# Patient Record
Sex: Male | Born: 1960
Health system: Southern US, Community
[De-identification: ages and names within clinical notes are randomized; demographics above are authoritative.]

## PROBLEM LIST (undated history)

## (undated) DIAGNOSIS — E785 Hyperlipidemia, unspecified: Secondary | ICD-10-CM

## (undated) HISTORY — DX: Hyperlipidemia, unspecified: E78.5

---
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
## 1998-04-13 HISTORY — PX: VASECTOMY: SHX75

## 1998-12-27 ENCOUNTER — Ambulatory Visit (HOSPITAL_BASED_OUTPATIENT_CLINIC_OR_DEPARTMENT_OTHER): Admission: RE | Admit: 1998-12-27 | Discharge: 1998-12-27 | Payer: Self-pay | Admitting: Urology

## 2006-03-05 ENCOUNTER — Ambulatory Visit: Payer: Self-pay | Admitting: Internal Medicine

## 2006-03-08 ENCOUNTER — Ambulatory Visit: Payer: Self-pay | Admitting: Internal Medicine

## 2006-03-08 LAB — CONVERTED CEMR LAB
ALT: 29 units/L (ref 0–40)
BUN: 10 mg/dL (ref 6–23)
Basophils Relative: 0.4 % (ref 0.0–1.0)
Calcium: 9.1 mg/dL (ref 8.4–10.5)
Chloride: 103 meq/L (ref 96–112)
Cholesterol: 190 mg/dL (ref 0–200)
Eosinophil percent: 3.8 % (ref 0.0–5.0)
Glucose, Bld: 177 mg/dL — ABNORMAL HIGH (ref 70–99)
HCT: 42.1 % (ref 39.0–52.0)
HDL: 36.3 mg/dL — ABNORMAL LOW (ref >39.0)
Hemoglobin: 14 g/dL (ref 13.0–17.0)
LDL Cholesterol: 123 mg/dL — ABNORMAL HIGH (ref 0–99)
Lymphocytes Relative: 37.2 % (ref 12.0–46.0)
MCV: 88.8 fL (ref 78.0–100.0)
Monocytes Absolute: 0.7 10*3/uL (ref 0.2–0.7)
Neutrophils Relative %: 47 % (ref 43.0–77.0)
PSA: 0.85 ng/mL (ref 0.10–4.00)
Potassium: 4 meq/L (ref 3.5–5.1)
TSH: 2.2 microintl units/mL (ref 0.35–5.50)
WBC: 5.9 10*3/uL (ref 4.5–10.5)

## 2006-03-12 ENCOUNTER — Ambulatory Visit: Payer: Self-pay | Admitting: Internal Medicine

## 2006-06-11 ENCOUNTER — Ambulatory Visit: Payer: Self-pay | Admitting: Internal Medicine

## 2006-06-11 LAB — CONVERTED CEMR LAB: Hgb A1c MFr Bld: 7.1 % — ABNORMAL HIGH (ref 4.6–6.0)

## 2006-09-13 ENCOUNTER — Encounter: Payer: Self-pay | Admitting: Internal Medicine

## 2006-09-13 DIAGNOSIS — E669 Obesity, unspecified: Secondary | ICD-10-CM | POA: Insufficient documentation

## 2006-09-13 DIAGNOSIS — E119 Type 2 diabetes mellitus without complications: Secondary | ICD-10-CM | POA: Insufficient documentation

## 2006-09-16 ENCOUNTER — Encounter: Payer: Self-pay | Admitting: Internal Medicine

## 2006-09-16 ENCOUNTER — Ambulatory Visit: Payer: Self-pay | Admitting: Internal Medicine

## 2006-11-19 ENCOUNTER — Encounter: Payer: Self-pay | Admitting: Internal Medicine

## 2007-11-10 ENCOUNTER — Ambulatory Visit: Payer: Self-pay | Admitting: Internal Medicine

## 2007-11-10 LAB — CONVERTED CEMR LAB: Blood Glucose, Fingerstick: 271

## 2007-11-28 ENCOUNTER — Ambulatory Visit: Payer: Self-pay | Admitting: Internal Medicine

## 2008-02-28 ENCOUNTER — Ambulatory Visit: Payer: Self-pay | Admitting: Internal Medicine

## 2008-02-28 LAB — CONVERTED CEMR LAB: Hgb A1c MFr Bld: 8.7 % — ABNORMAL HIGH (ref 4.6–6.0)

## 2009-12-13 ENCOUNTER — Ambulatory Visit: Payer: Self-pay | Admitting: Internal Medicine

## 2009-12-13 LAB — CONVERTED CEMR LAB: Blood Glucose, Fingerstick: 244

## 2010-03-14 ENCOUNTER — Ambulatory Visit: Payer: Self-pay | Admitting: Internal Medicine

## 2010-05-13 NOTE — Assessment & Plan Note (Signed)
Summary: 49 month rov/njr   Vital Signs:  Patient profile:   50 year old male Weight:      287 pounds Temp:     98.3 degrees F oral BP sitting:   112 / 78  (right arm) Cuff size:   large  Vitals Entered By: Duard Brady LPN (March 14, 2010 4:34 PM) CC: 3 mos rov - doing ok Is Patient Diabetic? Yes Did you bring your meter with you today? No CBG Result 158   CC:  3 mos rov - doing ok.  History of Present Illness: 50 -year-old patient who is seen today for follow-up of type 2 diabetes.  He has a history of exogenous obesity.  Diabetes is controlled with oral agents.  A postprandial blood sugar today 158.  No recent hemoglobin A1 C's.  His weight is unchanged.  Admits to dietary noncompliance.  Allergies (verified): No Known Drug Allergies  Past History:  Past Medical History: Reviewed history from 09/13/2006 and no changes required. Exogenous obesity Diabetes mellitus, type II  Review of Systems  The patient denies anorexia, fever, weight loss, weight gain, vision loss, decreased hearing, hoarseness, chest pain, syncope, dyspnea on exertion, peripheral edema, prolonged cough, headaches, hemoptysis, abdominal pain, melena, hematochezia, severe indigestion/heartburn, hematuria, incontinence, genital sores, muscle weakness, suspicious skin lesions, transient blindness, difficulty walking, depression, unusual weight change, abnormal bleeding, enlarged lymph nodes, angioedema, breast masses, and testicular masses.    Physical Exam  General:  overweight-appearing.  118/78 Eyes:  No corneal or conjunctival inflammation noted. EOMI. Perrla. Funduscopic exam benign, without hemorrhages, exudates or papilledema. Vision grossly normal. Neck:  No deformities, masses, or tenderness noted. Lungs:  Normal respiratory effort, chest expands symmetrically. Lungs are clear to auscultation, no crackles or wheezes. Heart:  Normal rate and regular rhythm. S1 and S2 normal without gallop,  murmur, click, rub or other extra sounds.   Impression & Recommendations:  Problem # 1:  DIABETES MELLITUS, TYPE II (ICD-250.00)  His updated medication list for this problem includes:    Glimepiride 4 Mg Tabs (Glimepiride) .Marland Kitchen... Take 1 tablet by mouth twice daily    Metformin Hcl 1000 Mg Tabs (Metformin hcl) ..... One twice daily  Orders: Capillary Blood Glucose/CBG (84132) Venipuncture (44010) TLB-A1C / Hgb A1C (Glycohemoglobin) (83036-A1C) Specimen Handling (27253)  Problem # 2:  EXOGENOUS OBESITY (ICD-278.00)  Complete Medication List: 1)  Glimepiride 4 Mg Tabs (Glimepiride) .... Take 1 tablet by mouth twice daily 2)  Accu-chek Aviva Kit (Blood glucose monitoring suppl) 3)  Accu-chek Aviva Strp (Glucose blood) .... Two times a day and prn 4)  Accu-chek Multiclix Lancets Misc (Lancets) .... Two times a day and prn 5)  Metformin Hcl 1000 Mg Tabs (Metformin hcl) .... One twice daily  Patient Instructions: 1)  Please schedule a follow-up appointment in 3 months. 2)  It is important that you exercise regularly at least 20 minutes 5 times a week. If you develop chest pain, have severe difficulty breathing, or feel very tired , stop exercising immediately and seek medical attention. 3)  You need to lose weight. Consider a lower calorie diet and regular exercise.  4)  Check your blood sugars regularly. If your readings are usually above : or below 70 you should contact our office. 5)  It is important that your Diabetic A1c level is checked every 3 months. 6)  See your eye doctor yearly to check for diabetic eye damage.   Orders Added: 1)  Capillary Blood Glucose/CBG [82948] 2)  Est.  Patient Level III [81191] 3)  Venipuncture [36415] 4)  TLB-A1C / Hgb A1C (Glycohemoglobin) [83036-A1C] 5)  Specimen Handling [99000]

## 2010-05-13 NOTE — Assessment & Plan Note (Signed)
Summary: med check/refill/self pay/cjr   Vital Signs:  Reeves profile:   50 year old male Height:      74 inches Weight:      288 pounds BMI:     37.11 Temp:     98.5 degrees F oral BP sitting:   150 / 90  (left arm) Cuff size:   large  Vitals Entered By: Duard Brady LPN (December 13, 2009 3:35 PM) CC: medication refills       Is Reeves Diabetic? Yes Did you bring your meter with you today? No CBG Result 244   CC:  medication refills      .  History of Present Illness: Douglas Reeves who is in today for follow-up of type 2 diabetes.  He has a history of exogenous obesity.  He is not been seen here since 2009.  He presently  has no health insurance.  He is on oral medications and has ganged a modest amount of weight since his last visit here.  No concerns or complaints.  Denies any cardiopulmonary complaints.  He is been out of his medications recently.  A random blood sugar 244  Allergies (verified): No Known Drug Allergies  Past History:  Past Medical History: Reviewed history from 09/13/2006 and no changes required. Exogenous obesity Diabetes mellitus, type II  Past Surgical History: Reviewed history from 09/13/2006 and no changes required. Appendectomy Vasectomy Back surgery, scoliosis  Review of Systems       The Reeves complains of weight gain.  The Reeves denies anorexia, fever, weight loss, vision loss, decreased hearing, hoarseness, chest pain, syncope, dyspnea on exertion, peripheral edema, prolonged cough, headaches, hemoptysis, abdominal pain, melena, hematochezia, severe indigestion/heartburn, hematuria, incontinence, genital sores, muscle weakness, suspicious skin lesions, transient blindness, difficulty walking, depression, unusual weight change, abnormal bleeding, enlarged lymph nodes, angioedema, breast masses, and testicular masses.    Physical Exam  General:  overweight-appearing.  122/82overweight-appearing.   Head:  Normocephalic  and atraumatic without obvious abnormalities. No apparent alopecia or balding. Eyes:  No corneal or conjunctival inflammation noted. EOMI. Perrla. Funduscopic exam benign, without hemorrhages, exudates or papilledema. Vision grossly normal. Mouth:  Oral mucosa and oropharynx without lesions or exudates.  Teeth in good repair. Neck:  No deformities, masses, or tenderness noted. Lungs:  Normal respiratory effort, chest expands symmetrically. Lungs are clear to auscultation, no crackles or wheezes. Heart:  Normal rate and regular rhythm. S1 and S2 normal without gallop, murmur, click, rub or other extra sounds. Abdomen:  Bowel sounds positive,abdomen soft and non-tender without masses, organomegaly or hernias noted. Msk:  No deformity or scoliosis noted of thoracic or lumbar spine.   Pulses:  R and L carotid,radial,femoral,dorsalis pedis and posterior tibial pulses are full and equal bilaterally Extremities:  No clubbing, cyanosis, edema, or deformity noted with normal full range of motion of all joints.    Diabetes Management Exam:    Eye Exam:       Eye Exam done here today          Results: normal   Impression & Recommendations:  Problem # 1:  DIABETES MELLITUS, TYPE II (ICD-250.00)  The following medications were removed from the medication list:    Metformin Hcl 500 Mg Xr24h-tab (Metformin hcl) .Marland Kitchen..Marland Kitchen Two tablets at bedtime His updated medication list for this problem includes:    Glimepiride 4 Mg Tabs (Glimepiride) .Marland Kitchen... Take 1 tablet by mouth twice daily    Metformin Hcl 1000 Mg Tabs (Metformin hcl) ..... One twice daily  Orders: Capillary Blood Glucose/CBG (16109)  a home glucometer, dispensed with samples of test strip.  Will increase his metformin to 2000 mg daily in divided dosages.  Lifestyle issues discussed at length;  will recheck in 3 months  The following medications were removed from the medication list:    Metformin Hcl 500 Mg Xr24h-tab (Metformin hcl) .Marland Kitchen..Marland Kitchen Two  tablets at bedtime His updated medication list for this problem includes:    Glimepiride 4 Mg Tabs (Glimepiride) .Marland Kitchen... Take 1 tablet by mouth twice daily    Metformin Hcl 1000 Mg Tabs (Metformin hcl) ..... One twice daily  Problem # 2:  EXOGENOUS OBESITY (ICD-278.00)  Complete Medication List: 1)  Glimepiride 4 Mg Tabs (Glimepiride) .... Take 1 tablet by mouth twice daily 2)  Accu-chek Aviva Kit (Blood glucose monitoring suppl) 3)  Accu-chek Aviva Strp (Glucose blood) .... Two times a day and prn 4)  Accu-chek Multiclix Lancets Misc (Lancets) .... Two times a day and prn 5)  Metformin Hcl 1000 Mg Tabs (Metformin hcl) .... One twice daily  Reeves Instructions: 1)  Please schedule a follow-up appointment in 3 months. 2)  Limit your Sodium (Salt). 3)  It is important that you exercise regularly at least 20 minutes 5 times a week. If you develop chest pain, have severe difficulty breathing, or feel very tired , stop exercising immediately and seek medical attention. 4)  You need to lose weight. Consider a lower calorie diet and regular exercise.  5)  Check your blood sugars regularly. If your readings are usually above : or below 70 you should contact our office. 6)  It is important that your Diabetic A1c level is checked every 3 months. 7)  See your eye doctor yearly to check for diabetic eye damage. Prescriptions: METFORMIN HCL 1000 MG TABS (METFORMIN HCL) one twice daily  #180 x 6   Entered and Authorized by:   Gordy Savers  MD   Signed by:   Gordy Savers  MD on 12/13/2009   Method used:   Print then Give to Reeves   RxID:   [id number] ACCU-CHEK MULTICLIX LANCETS  MISC (LANCETS) two times a day and prn  #90 x 6   Entered and Authorized by:   Gordy Savers  MD   Signed by:   Gordy Savers  MD on 12/13/2009   Method used:   Print then Give to Reeves   RxID:   [id number] ACCU-CHEK AVIVA  STRP (GLUCOSE BLOOD) two times a day and prn  #90 x 6    Entered and Authorized by:   Gordy Savers  MD   Signed by:   Gordy Savers  MD on 12/13/2009   Method used:   Print then Give to Reeves   RxID:   [id number] GLIMEPIRIDE 4 MG TABS (GLIMEPIRIDE) Take 1 tablet by mouth twice daily  #180 Each x 3   Entered and Authorized by:   Gordy Savers  MD   Signed by:   Gordy Savers  MD on 12/13/2009   Method used:   Print then Give to Reeves   RxID:   [id number]

## 2010-06-13 ENCOUNTER — Ambulatory Visit: Payer: Self-pay | Admitting: Internal Medicine

## 2010-06-27 ENCOUNTER — Ambulatory Visit: Payer: Self-pay | Admitting: Internal Medicine

## 2010-08-29 NOTE — Assessment & Plan Note (Signed)
[facility]                            [facility] OFFICE NOTE   Douglas Reeves, Douglas Reeves                         MRN:          [id number]  DATE:03/12/2006                            DOB:          1960-08-08    50 year old gentleman who is seen today for a wellness exam.  He has had  a remote appendectomy at age 18 and also surgery at age 73 for scoliosis.  Approximately 7-8 years ago, he had a vasectomy.   REVIEW OF SYSTEMS:  System exam is negative.  He is doing well, no  clinical concerns.   SOCIAL HISTORY:  He works as a Naval architect, fairly sedentary.  Married  with three stepchildren.  Since his initial visit here 2 1/2 years ago,  he has lost approximately 12 pounds in weight.  Non-smoker.   FAMILY HISTORY:  Father died at 27 of stomach cancer.  Mother died at 38  of dementia.  Two of five brothers have diabetes.  Also, positive for  hypertension.   PHYSICAL EXAMINATION:  GENERAL:  Exam revealed an overweight male who  appeared robust in no acute distress.  VITAL SIGNS:  Blood pressure 122/74.  SKIN:  Negative.  HEENT:  Fundi, ears, nose, and throat clear.  NECK:  No adenopathy or thyroid enlargement.  CHEST:  Clear.  CARDIOVASCULAR EXAM:  Normal heart sounds, no murmurs.  ABDOMEN:  Obese, soft and nontender.  Surgical scar in the right lower  quadrant noted.  GU:  External genitalia noted.  EXTREMITIES:  Full peripheral pulses, no edema.   Laboratory studies were reviewed and these were unremarkable except for  a fasting blood sugar of 177.  Hemoglobin A1c was 9.3.   IMPRESSION:  1. Newly diagnosed diabetes.  2. Exogenous obesity.   DISPOSITION:  Weight loss issues discussed.  A Glucometer was dispensed.  He will be placed on Metformin extended release 1000 mg at bedtime as  well as Glimepiride 10 mg every morning.  Will recheck in three months.     Gordy Savers, MD  Electronically Signed   PFK/MedQ  DD: 03/12/2006  DT:  03/13/2006  Job #: [id number]

## 2011-03-04 ENCOUNTER — Emergency Department (HOSPITAL_COMMUNITY)
Admission: EM | Admit: 2011-03-04 | Discharge: 2011-03-04 | Disposition: A | Discharge status: Home / Self Care | Payer: No Typology Code available for payment source | Attending: Emergency Medicine | Admitting: Emergency Medicine

## 2011-03-04 ENCOUNTER — Encounter: Payer: Self-pay | Admitting: *Deleted

## 2011-03-04 ENCOUNTER — Emergency Department (HOSPITAL_COMMUNITY): Payer: No Typology Code available for payment source

## 2011-03-04 DIAGNOSIS — S335XXA Sprain of ligaments of lumbar spine, initial encounter: Secondary | ICD-10-CM | POA: Insufficient documentation

## 2011-03-04 DIAGNOSIS — E119 Type 2 diabetes mellitus without complications: Secondary | ICD-10-CM | POA: Insufficient documentation

## 2011-03-04 DIAGNOSIS — Z79899 Other long term (current) drug therapy: Secondary | ICD-10-CM | POA: Insufficient documentation

## 2011-03-04 DIAGNOSIS — M545 Low back pain, unspecified: Secondary | ICD-10-CM | POA: Insufficient documentation

## 2011-03-04 DIAGNOSIS — M542 Cervicalgia: Secondary | ICD-10-CM | POA: Insufficient documentation

## 2011-03-04 DIAGNOSIS — S39012A Strain of muscle, fascia and tendon of lower back, initial encounter: Secondary | ICD-10-CM

## 2011-03-04 LAB — DIFFERENTIAL
Basophils Absolute: 0 10*3/uL (ref 0.0–0.1)
Lymphocytes Relative: 23 % (ref 12–46)
Lymphs Abs: 2.2 10*3/uL (ref 0.7–4.0)
Monocytes Absolute: 0.8 10*3/uL (ref 0.1–1.0)
Monocytes Relative: 8 % (ref 3–12)
Neutro Abs: 6.5 10*3/uL (ref 1.7–7.7)

## 2011-03-04 LAB — CBC
HCT: 42.1 % (ref 39.0–52.0)
Hemoglobin: 14.5 g/dL (ref 13.0–17.0)
RBC: 4.72 MIL/uL (ref 4.22–5.81)
RDW: 13.9 % (ref 11.5–15.5)
WBC: 9.7 10*3/uL (ref 4.0–10.5)

## 2011-03-04 LAB — BASIC METABOLIC PANEL
CO2: 28 mEq/L (ref 19–32)
Chloride: 97 mEq/L (ref 96–112)
Creatinine, Ser: 0.96 mg/dL (ref 0.50–1.35)
Glucose, Bld: 261 mg/dL — ABNORMAL HIGH (ref 70–99)

## 2011-03-04 MED ORDER — OXYCODONE-ACETAMINOPHEN 5-325 MG PO TABS: 1.0000 | ORAL_TABLET | ORAL | Status: DC | PRN

## 2011-03-04 MED ORDER — MORPHINE SULFATE 4 MG/ML IJ SOLN
4.0000 mg | Freq: Once | INTRAMUSCULAR | Status: AC
  Administered 2011-03-04: 4 mg via INTRAVENOUS

## 2011-03-04 MED ORDER — MORPHINE SULFATE 4 MG/ML IJ SOLN
4.0000 mg | Freq: Once | INTRAMUSCULAR | Status: DC
  Filled 2011-03-04: qty 1

## 2011-03-04 MED ORDER — MORPHINE SULFATE 4 MG/ML IJ SOLN
INTRAMUSCULAR | Status: AC
  Filled 2011-03-04: qty 1

## 2011-03-04 NOTE — ED Notes (Signed)
Patient transported to X-ray 

## 2011-03-04 NOTE — ED Notes (Signed)
IV access d/c'd catheter intact, no redness or swelling at the site

## 2011-03-04 NOTE — ED Notes (Signed)
Pt given rx and d/c instructions.
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
## 2011-03-04 NOTE — ED Notes (Signed)
Pt returned from radiology.
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
## 2011-03-09 ENCOUNTER — Ambulatory Visit (INDEPENDENT_AMBULATORY_CARE_PROVIDER_SITE_OTHER): Payer: No Typology Code available for payment source | Admitting: Internal Medicine

## 2011-03-09 ENCOUNTER — Encounter: Payer: Self-pay | Admitting: Internal Medicine

## 2011-03-09 DIAGNOSIS — E119 Type 2 diabetes mellitus without complications: Secondary | ICD-10-CM

## 2011-03-09 DIAGNOSIS — M549 Dorsalgia, unspecified: Secondary | ICD-10-CM

## 2011-03-09 LAB — HEMOGLOBIN A1C: Hgb A1c MFr Bld: 8.7 % — ABNORMAL HIGH (ref 4.6–6.5)

## 2011-03-09 MED ORDER — OXYCODONE-ACETAMINOPHEN 5-325 MG PO TABS: 1.0000 | ORAL_TABLET | ORAL | Status: AC | PRN

## 2011-03-09 MED ORDER — METFORMIN HCL 1000 MG PO TABS: 1000.0000 mg | ORAL_TABLET | Freq: Two times a day (BID) | ORAL | Status: DC

## 2011-03-09 MED ORDER — GLIMEPIRIDE 4 MG PO TABS: 4.0000 mg | ORAL_TABLET | Freq: Every day | ORAL | Status: DC

## 2011-03-09 NOTE — Patient Instructions (Signed)
You  may move around, but avoid painful motions and activities.  Apply ice to the sore area for 15 to 20 minutes 3 or 4 times daily for the next two to 3 days.   Please check your hemoglobin A1c every 3 months  You need to lose weight.  Consider a lower calorie diet and regular exercise.
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
## 2011-03-10 ENCOUNTER — Telehealth: Payer: Self-pay

## 2011-03-10 MED ORDER — PIOGLITAZONE HCL 15 MG PO TABS: 15.0000 mg | ORAL_TABLET | Freq: Every day | ORAL | Status: DC

## 2011-03-10 NOTE — Progress Notes (Signed)
Quick Note:  Spoke with pt- r/t new med to start. rx sent to [facility] ______

## 2011-03-10 NOTE — Telephone Encounter (Signed)
New med to start r/t A1c elevated

## 2011-03-23 ENCOUNTER — Telehealth: Payer: Self-pay | Admitting: Internal Medicine

## 2011-03-23 NOTE — Telephone Encounter (Signed)
He was seen 03/09/11 in office - do you remember seeing/filling out ins. paper work?

## 2011-03-23 NOTE — Telephone Encounter (Signed)
Pt dropped off insurance papers about a week ago and wanted to know the status. Please contact

## 2011-03-24 NOTE — Telephone Encounter (Signed)
I havenot seen these papers he is requesting - have you seen them - if not let me know so we can call him

## 2011-03-25 NOTE — Telephone Encounter (Signed)
Spoke with pt- he will bring another copy in to be filled out- asked him to stay until I look at ppwk - if I can fill out then I will - KIK

## 2011-03-25 NOTE — Telephone Encounter (Signed)
I have not seen this paperwork.  

## 2011-04-03 DIAGNOSIS — Z0279 Encounter for issue of other medical certificate: Secondary | ICD-10-CM

## 2011-05-28 ENCOUNTER — Encounter: Payer: Self-pay | Admitting: Internal Medicine

## 2011-06-05 ENCOUNTER — Encounter: Payer: Self-pay | Admitting: Internal Medicine

## 2011-06-05 ENCOUNTER — Ambulatory Visit (INDEPENDENT_AMBULATORY_CARE_PROVIDER_SITE_OTHER): Payer: 59 | Admitting: Internal Medicine

## 2011-06-05 VITALS — BP 122/80 | HR 74 | Temp 98.3°F | Resp 18 | Ht 75.0 in | Wt 282.0 lb

## 2011-06-05 DIAGNOSIS — Z23 Encounter for immunization: Secondary | ICD-10-CM

## 2011-06-05 DIAGNOSIS — Z0000 Encounter for general adult medical examination without abnormal findings: Secondary | ICD-10-CM

## 2011-06-05 LAB — POCT URINALYSIS DIPSTICK
Bilirubin, UA: NEGATIVE
Glucose, UA: NEGATIVE
Nitrite, UA: NEGATIVE
Spec Grav, UA: <=1.005
Urobilinogen, UA: 0.2

## 2011-06-05 MED ORDER — PIOGLITAZONE HCL 45 MG PO TABS: 45.0000 mg | ORAL_TABLET | Freq: Every day | ORAL | Status: DC

## 2011-06-05 NOTE — Progress Notes (Signed)
Addended by: Duard Brady I on: 06/05/2011 04:47 PM   Modules accepted: Orders

## 2011-06-05 NOTE — Progress Notes (Signed)
  Subjective:    Patient ID: Douglas Reeves, male    DOB: 07/22/1960, 51 y.o.   MRN: [id number]  HPI  51 year old patient who is seen today for a DOT physical. He has a history of type 2 diabetes as well as exogenous obesity. Actos was added to his drug regimen approximately 2 months ago due to hemoglobin A1c in excess of 8. He feels that his glycemic control has improved. There is been only modest weight loss. No hypoglycemia.  Wt Readings from Last 3 Encounters:  06/05/11 282 lb (127.914 kg)  03/09/11 283 lb (128.368 kg)  03/14/10 287 lb (130.182 kg)    Review of Systems  Constitutional: Negative for fever, chills, appetite change and fatigue.  HENT: Negative for hearing loss, ear pain, congestion, sore throat, trouble swallowing, neck stiffness, dental problem, voice change and tinnitus.   Eyes: Negative for pain, discharge and visual disturbance.  Respiratory: Negative for cough, chest tightness, wheezing and stridor.   Cardiovascular: Negative for chest pain, palpitations and leg swelling.  Gastrointestinal: Negative for nausea, vomiting, abdominal pain, diarrhea, constipation, blood in stool and abdominal distention.  Genitourinary: Negative for urgency, hematuria, flank pain, discharge, difficulty urinating and genital sores.  Musculoskeletal: Negative for myalgias, back pain, joint swelling, arthralgias and gait problem.  Skin: Negative for rash.  Neurological: Negative for dizziness, syncope, speech difficulty, weakness, numbness and headaches.  Hematological: Negative for adenopathy. Does not bruise/bleed easily.  Psychiatric/Behavioral: Negative for behavioral problems and dysphoric mood. The patient is not nervous/anxious.        Objective:   Physical Exam  Constitutional: He is oriented to person, place, and time. He appears well-developed.  HENT:  Head: Normocephalic.  Right Ear: External ear normal.  Left Ear: External ear normal.  Eyes: Conjunctivae and EOM are normal.    Neck: Normal range of motion.  Cardiovascular: Normal rate and normal heart sounds.   Pulmonary/Chest: Breath sounds normal.  Abdominal: Soft. Bowel sounds are normal.       Surgical scar right lower abdominal area  Musculoskeletal: Normal range of motion. He exhibits no edema and no tenderness.  Neurological: He is alert and oriented to person, place, and time.       Intact vibratory sensation soft touch and monofilament testing  Psychiatric: He has a normal mood and affect. His behavior is normal.          Assessment & Plan:   Preventive health care/DOT physical. Forms completed Diabetes mellitus. We'll increase Actos to 45 mg daily nonpharmacological measures discussed Exogenous obesity weight loss exercise encouraged   Recheck 3 months

## 2011-06-05 NOTE — Patient Instructions (Signed)
Limit your sodium (Salt) intake    It is important that you exercise regularly, at least 20 minutes 3 to 4 times per week.  If you develop chest pain or shortness of breath seek  medical attention.  You need to lose weight.  Consider a lower calorie diet and regular exercise.   Please check your hemoglobin A1c every 3 months   

## 2011-06-08 ENCOUNTER — Ambulatory Visit: Payer: No Typology Code available for payment source | Admitting: Internal Medicine

## 2011-09-04 ENCOUNTER — Encounter: Payer: Self-pay | Admitting: Internal Medicine

## 2011-09-04 ENCOUNTER — Ambulatory Visit (INDEPENDENT_AMBULATORY_CARE_PROVIDER_SITE_OTHER): Payer: 59 | Admitting: Internal Medicine

## 2011-09-04 DIAGNOSIS — E119 Type 2 diabetes mellitus without complications: Secondary | ICD-10-CM

## 2011-09-04 DIAGNOSIS — E669 Obesity, unspecified: Secondary | ICD-10-CM

## 2011-09-04 LAB — HEMOGLOBIN A1C: Hgb A1c MFr Bld: 7.3 % — ABNORMAL HIGH (ref 4.6–6.5)

## 2011-09-04 LAB — GLUCOSE, POCT (MANUAL RESULT ENTRY): POC Glucose: 96 mg/dl (ref 70–99)

## 2011-09-04 NOTE — Progress Notes (Signed)
  Subjective:    Patient ID: Douglas Reeves, male    DOB: 1961-03-02, 51 y.o.   MRN: [id number]  HPI  Wt Readings from Last 3 Encounters:  09/04/11 284 lb (128.822 kg)  06/05/11 282 lb (127.914 kg)  03/09/11 283 lb (128.11 kg)   51 year old patient seen today for followup of his type 2 diabetes. He is still well although there has been no weight loss. Pioglitazone has been added to his regimen with much improved glycemic control fasting blood sugar today 96. Last hemoglobin A1c over 8 no new concerns or complaints    Review of Systems  Constitutional: Negative for fever, chills, appetite change and fatigue.  HENT: Negative for hearing loss, ear pain, congestion, sore throat, trouble swallowing, neck stiffness, dental problem, voice change and tinnitus.   Eyes: Negative for pain, discharge and visual disturbance.  Respiratory: Negative for cough, chest tightness, wheezing and stridor.   Cardiovascular: Negative for chest pain, palpitations and leg swelling.  Gastrointestinal: Negative for nausea, vomiting, abdominal pain, diarrhea, constipation, blood in stool and abdominal distention.  Genitourinary: Negative for urgency, hematuria, flank pain, discharge, difficulty urinating and genital sores.  Musculoskeletal: Negative for myalgias, back pain, joint swelling, arthralgias and gait problem.  Skin: Negative for rash.  Neurological: Negative for dizziness, syncope, speech difficulty, weakness, numbness and headaches.  Hematological: Negative for adenopathy. Does not bruise/bleed easily.  Psychiatric/Behavioral: Negative for behavioral problems and dysphoric mood. The patient is not nervous/anxious.        Objective:   Physical Exam  Constitutional: He is oriented to person, place, and time. He appears well-developed.  HENT:  Head: Normocephalic.  Right Ear: External ear normal.  Left Ear: External ear normal.  Eyes: Conjunctivae and EOM are normal.  Neck: Normal range of motion.    Cardiovascular: Normal rate and normal heart sounds.   Pulmonary/Chest: Breath sounds normal.  Abdominal: Bowel sounds are normal.  Musculoskeletal: Normal range of motion. He exhibits no edema and no tenderness.  Neurological: He is alert and oriented to person, place, and time.  Psychiatric: He has a normal mood and affect. His behavior is normal.          Assessment & Plan:   Diabetes mellitus type 2 Exogenous obesity. Diet weight loss exercise all encouraged  Recheck 3 months

## 2011-09-04 NOTE — Patient Instructions (Signed)
Please check your hemoglobin A1c every 3 months    It is important that you exercise regularly, at least 20 minutes 3 to 4 times per week.  If you develop chest pain or shortness of breath seek  medical attention.  You need to lose weight.  Consider a lower calorie diet and regular exercise. 

## 2011-12-04 ENCOUNTER — Encounter: Payer: Self-pay | Admitting: Internal Medicine

## 2011-12-04 ENCOUNTER — Ambulatory Visit (INDEPENDENT_AMBULATORY_CARE_PROVIDER_SITE_OTHER): Payer: 59 | Admitting: Internal Medicine

## 2011-12-04 VITALS — BP 124/82 | HR 92 | Temp 98.1°F | Wt 290.0 lb

## 2011-12-04 DIAGNOSIS — E119 Type 2 diabetes mellitus without complications: Secondary | ICD-10-CM

## 2011-12-04 NOTE — Patient Instructions (Addendum)
Please check your hemoglobin A1c every 3 months    It is important that you exercise regularly, at least 20 minutes 3 to 4 times per week.  If you develop chest pain or shortness of breath seek  medical attention.  You need to lose weight.  Consider a lower calorie diet and regular exercise.Diabetes and Exercise Regular exercise is important and can help:    Control blood glucose (sugar).   Decrease blood pressure.     Control blood lipids (cholesterol, triglycerides).   Improve overall health.  BENEFITS FROM EXERCISE  Improved fitness.   Improved flexibility.   Improved endurance.   Increased bone density.   Weight control.   Increased muscle strength.   Decreased body fat.   Improvement of the body's use of insulin, a hormone.   Increased insulin sensitivity.   Reduction of insulin needs.   Reduced stress and tension.   Helps you feel better.  People with diabetes who add exercise to their lifestyle gain additional benefits, including:  Weight loss.   Reduced appetite.   Improvement of the body's use of blood glucose.   Decreased risk factors for heart disease:   Lowering of cholesterol and triglycerides.   Raising the level of good cholesterol (high-density lipoproteins, HDL).   Lowering blood sugar.   Decreased blood pressure.  TYPE 1 DIABETES AND EXERCISE  Exercise will usually lower your blood glucose.   If blood glucose is greater than 240 mg/dl, check urine ketones. If ketones are present, do not exercise.   Location of the insulin injection sites may need to be adjusted with exercise. Avoid injecting insulin into areas of the body that will be exercised. For example, avoid injecting insulin into:   The arms when playing tennis.   The legs when jogging. For more information, discuss this with your caregiver.   Keep a record of:   Food intake.   Type and amount of exercise.   Expected peak times of insulin action.   Blood glucose  levels.  Do this before, during, and after exercise. Review your records with your caregiver. This will help you to develop guidelines for adjusting food intake and insulin amounts.   TYPE 2 DIABETES AND EXERCISE  Regular physical activity can help control blood glucose.   Exercise is important because it may:   Increase the body's sensitivity to insulin.   Improve blood glucose control.   Exercise reduces the risk of heart disease. It decreases serum cholesterol and triglycerides. It also lowers blood pressure.   Those who take insulin or oral hypoglycemic agents should watch for signs of hypoglycemia. These signs include dizziness, shaking, sweating, chills, and confusion.   Body water is lost during exercise. It must be replaced. This will help to avoid loss of body fluids (dehydration) or heat stroke.  Be sure to talk to your caregiver before starting an exercise program to make sure it is safe for you. Remember, any activity is better than none.   Document Released: 06/20/2003 Document Revised: 03/19/2011 Document Reviewed: 10/04/2008 [facility] Patient Information 2012 [place], [place].Diabetes Meal Planning Guide The diabetes meal planning guide is a tool to help you plan your meals and snacks. It is important for people with diabetes to manage their blood glucose (sugar) levels. Choosing the right foods and the right amounts throughout your day will help control your blood glucose. Eating right can even help you improve your blood pressure and reach or maintain a healthy weight. CARBOHYDRATE COUNTING MADE EASY When you  eat carbohydrates, they turn to sugar. This raises your blood glucose level. Counting carbohydrates can help you control this level so you feel better. When you plan your meals by counting carbohydrates, you can have more flexibility in what you eat and balance your medicine with your food intake. Carbohydrate counting simply means adding up the total amount of carbohydrate  grams in your meals and snacks. Try to eat about the same amount at each meal. Foods with carbohydrates are listed below. Each portion below is 1 carbohydrate serving or 15 grams of carbohydrates. Ask your dietician how many grams of carbohydrates you should eat at each meal or snack. Grains and Starches  1 slice bread.    English muffin or hotdog/hamburger bun.    cup cold cereal (unsweetened).   ? cup cooked pasta or rice.    cup starchy vegetables (corn, potatoes, peas, beans, winter squash).   1 tortilla (6 inches).    bagel.   1 waffle or pancake (size of a CD).    cup cooked cereal.   4 to 6 small crackers.  *Whole grain is recommended. Fruit  1 cup fresh unsweetened berries, melon, papaya, pineapple.   1 small fresh fruit.    banana or mango.    cup fruit juice (4 oz unsweetened).    cup canned fruit in natural juice or water.   2 tbs dried fruit.   12 to 15 grapes or cherries.  Milk and Yogurt  1 cup fat-free or 1% milk.   1 cup soy milk.   6 oz light yogurt with sugar-free sweetener.   6 oz low-fat soy yogurt.   6 oz plain yogurt.  Vegetables  1 cup raw or  cup cooked is counted as 0 carbohydrates or a "free" food.   If you eat 3 or more servings at 1 meal, count them as 1 carbohydrate serving.  Other Carbohydrates   oz chips or pretzels.    cup ice cream or frozen yogurt.    cup sherbet or sorbet.   2 inch square cake, no frosting.   1 tbs honey, sugar, jam, jelly, or syrup.   2 small cookies.   3 squares of graham crackers.   3 cups popcorn.   6 crackers.   1 cup broth-based soup.   Count 1 cup casserole or other mixed foods as 2 carbohydrate servings.   Foods with less than 20 calories in a serving may be counted as 0 carbohydrates or a "free" food.  You may want to purchase a book or computer software that lists the carbohydrate gram counts of different foods. In addition, the nutrition facts panel on the labels  of the foods you eat are a good source of this information. The label will tell you how big the serving size is and the total number of carbohydrate grams you will be eating per serving. Divide this number by 15 to obtain the number of carbohydrate servings in a portion. Remember, 1 carbohydrate serving equals 15 grams of carbohydrate. SERVING SIZES Measuring foods and serving sizes helps you make sure you are getting the right amount of food. The list below tells how big or small some common serving sizes are.  1 oz.........4 stacked dice.   3 oz........Mar[place] KitchenDeck of cards.   1 tsp.......Mar[place] KitchenTip of little finger.   1 tbs......Marland KitchenMarland KitchenThumb.   2 tbs.......Marland KitchenGolf ball.    cup......Marland KitchenHalf of a fist.   1 cup.......Marland KitchenA fist.  SAMPLE DIABETES MEAL PLAN Below is a sample meal plan  that includes foods from the grain and starches, dairy, vegetable, fruit, and meat groups. A dietician can individualize a meal plan to fit your calorie needs and tell you the number of servings needed from each food group. However, controlling the total amount of carbohydrates in your meal or snack is more important than making sure you include all of the food groups at every meal. You may interchange carbohydrate containing foods (dairy, starches, and fruits). The meal plan below is an example of a 2000 calorie diet using carbohydrate counting. This meal plan has 17 carbohydrate servings. Breakfast  1 cup oatmeal (2 carb servings).    cup light yogurt (1 carb serving).   1 cup blueberries (1 carb serving).    cup almonds.  Snack  1 large apple (2 carb servings).   1 low-fat string cheese stick.  Lunch  Chicken breast salad.   1 cup spinach.    cup chopped tomatoes.   2 oz chicken breast, sliced.   2 tbs low-fat [place] dressing.   12 whole-wheat crackers (2 carb servings).   12 to 15 grapes (1 carb serving).   1 cup low-fat milk (1 carb serving).  Snack  1 cup carrots.    cup hummus (1 carb serving).    Dinner  3 oz broiled salmon.   1 cup brown rice (3 carb servings).  Snack  1  cups steamed broccoli (1 carb serving) drizzled with 1 tsp olive oil and lemon juice.   1 cup light pudding (2 carb servings).  DIABETES MEAL PLANNING WORKSHEET Your dietician can use this worksheet to help you decide how many servings of foods and what types of foods are right for you.   BREAKFAST Food Group and Servings / Carb Servings Grain/Starches __________________________________ Dairy __________________________________________ Vegetable ______________________________________ Fruit ___________________________________________ Meat __________________________________________ Fat ____________________________________________ LUNCH Food Group and Servings / Carb Servings Grain/Starches ___________________________________ Dairy ___________________________________________ Fruit ____________________________________________ Meat ___________________________________________ Fat _____________________________________________ Laural Golden Food Group and Servings / Carb Servings Grain/Starches ___________________________________ Dairy ___________________________________________ Fruit ____________________________________________ Meat ___________________________________________ Fat _____________________________________________ SNACKS Food Group and Servings / Carb Servings Grain/Starches ___________________________________ Dairy ___________________________________________ Vegetable _______________________________________ Fruit ____________________________________________ Meat ___________________________________________ Fat _____________________________________________ DAILY TOTALS Starches _________________________ Vegetable ________________________ Fruit ____________________________ Dairy ____________________________ Meat ____________________________ Fat ______________________________ Document Released:  12/25/2004 Document Revised: 03/19/2011 Document Reviewed: 11/05/2008 [employer] Patient Information 2012 [place], [place].Diabetes, Type 2 Diabetes is a long-lasting (chronic) disease. In type 2 diabetes, the pancreas does not make enough insulin (a hormone), and the body does not respond normally to the insulin that is made. This type of diabetes was also previously called adult-onset diabetes. It usually occurs after the age of 30, but it can occur at any age.   CAUSES   Type 2 diabetes happens because the pancreas is not making enough insulin or your body has trouble using the insulin that your pancreas does make properly. SYMPTOMS    Drinking more than usual.   Urinating more than usual.   Blurred vision.   Dry, itchy skin.   Frequent infections.   Feeling more tired than usual (fatigue).  DIAGNOSIS The diagnosis of type 2 diabetes is usually made by one of the following tests:  Fasting blood glucose test. You will not eat for at least 8 hours and then take a blood test.   Random blood glucose test. Your blood glucose (sugar) is checked at any time of the day regardless of when you ate.   Oral glucose tolerance test (OGTT). Your blood glucose is measured after you have not  eaten (fasted) and then after you drink a glucose containing beverage.  TREATMENT    Healthy eating.   Exercise.   Medicine, if needed.   Monitoring blood glucose.   Seeing your caregiver regularly.  HOME CARE INSTRUCTIONS    Check your blood glucose at least once a day. More frequent monitoring may be necessary, depending on your medicines and on how well your diabetes is controlled. Your caregiver will advise you.   Take your medicine as directed by your caregiver.   Do not smoke.   Make wise food choices. Ask your caregiver for information. Weight loss can improve your diabetes.   Learn about low blood glucose (hypoglycemia) and how to treat it.   Get your eyes checked regularly.   Have a  yearly physical exam. Have your blood pressure checked and your blood and urine tested.   Wear a pendant or bracelet saying that you have diabetes.   Check your feet every night for cuts, sores, blisters, and redness. Let your caregiver know if you have any problems.  SEEK MEDICAL CARE IF:    You have problems keeping your blood glucose in target range.   You have problems with your medicines.   You have symptoms of an illness that do not improve after 24 hours.   You have a sore or wound that is not healing.   You notice a change in vision or a new problem with your vision.   You have a fever.  MAKE SURE YOU:  Understand these instructions.   Will watch your condition.   Will get help right away if you are not doing well or get worse.  Document Released: 03/30/2005 Document Revised: 03/19/2011 Document Reviewed: 09/15/2010 [facility] Patient Information 2012 [place], [place].Diets for Diabetes, Food Labeling Look at food labels to help you decide how much of a product you can eat. You will want to check the amount of total carbohydrate in a serving to see how the food fits into your meal plan. In the list of ingredients, the ingredient present in the largest amount by weight must be listed first, followed by the other ingredients in descending order. STANDARD OF IDENTITY Most products have a list of ingredients. However, foods that the Food and Drug Administration (FDA) has given a standard of identity do not need a list of ingredients. A standard of identity means that a food must contain certain ingredients if it is called a particular name. Examples are mayonnaise, peanut butter, ketchup, jelly, and cheese. LABELING TERMS There are many terms found on food labels. Some of these terms have specific definitions. Some terms are regulated by the FDA, and the FDA has clearly specified how they can be used. Others are not regulated or well-defined and can be misleading and  confusing. SPECIFICALLY DEFINED TERMS Nutritive Sweetener.  A sweetener that contains calories,such as table sugar or honey.  Nonnutritive Sweetener.  A sweetener with few or no calories,such as saccharin, aspartame, sucralose, and cyclamate.  LABELING TERMS REGULATED BY THE FDA Free.  The product contains only a tiny or small amount of fat, cholesterol, sodium, sugar, or calories. For example, a "fat-free" product will contain less than 0.5 g of fat per serving.  Low.  A food described as "low" in fat, saturated fat, cholesterol, sodium, or calories could be eaten fairly often without exceeding dietary guidelines. For example, "low in fat" means no more than 3 g of fat per serving.  Lean.  "Lean" and "extra lean" are U.S.  Department of Agriculture Architect) terms for use on meat and poultry products. "Lean" means the product contains less than 10 g of fat, 4 g of saturated fat, and 95 mg of cholesterol per serving. "Lean" is not as low in fat as a product labeled "low."  Extra Lean.  "Extra lean" means the product contains less than 5 g of fat, 2 g of saturated fat, and 95 mg of cholesterol per serving. While "extra lean" has less fat than "lean," it is still higher in fat than a product labeled "low."  Reduced, Less, Fewer.  A diet product that contains 25% less of a nutrient or calories than the regular version. For example, hot dogs might be labeled "25% less fat than our regular hot dogs."  Light/Lite.  A diet product that contains ? fewer calories or  the fat of the original. For example, "light in sodium" means a product with  the usual sodium.  More.  One serving contains at least 10% more of the daily value of a vitamin, mineral, or fiber than usual.  Good Source Of.  One serving contains 10% to 19% of the daily value for a particular vitamin, mineral, or fiber.  Excellent Source Of.  One serving contains 20% or more of the daily value for a particular nutrient. Other terms  used might be "high in" or "rich in."  Enriched or Fortified.  The product contains added vitamins, minerals, or protein. Nutrition labeling must be used on enriched or fortified foods.  Imitation.  The product has been altered so that it is lower in protein, vitamins, or minerals than the usual food,such as imitation peanut butter.  Total Fat.  The number listed is the total of all fat found in a serving of the product. Under total fat, food labels must list saturated fat and trans fat, which are associated with raising bad cholesterol and an increased risk of heart blood vessel disease.  Saturated Fat.  Mainly fats from animal-based sources. Some examples are red meat, cheese, cream, whole milk, and coconut oil.  Trans Fat.  Found in some fried snack foods, packaged foods, and fried restaurant foods. It is recommended you eat as close to 0 g of trans fat as possible, since it raises bad cholesterol and lowers good cholesterol.  Polyunsaturated and Monounsaturated Fats.  More healthful fats. These fats are from plant sources.  Total Carbohydrate.  The number of carbohydrate grams in a serving of the product. Under total carbohydrate are listed the other carbohydrate sources, such as dietary fiber and sugars.  Dietary Fiber.  A carbohydrate from plant sources.  Sugars.  Sugars listed on the label contain all naturally occurring sugars as well as added sugars.  LABELING TERMS NOT REGULATED BY THE FDA Sugarless.  Table sugar (sucrose) has not been added. However, the manufacturer may use another form of sugar in place of sucrose to sweeten the product. For example, sugar alcohols are used to sweeten foods. Sugar alcohols are a form of sugar but are not table sugar. If a product contains sugar alcohols in place of sucrose, it can still be labeled "sugarless."  Low Salt, Salt-Free, Unsalted, No Salt, No Salt Added, Without Added Salt.  Food that is usually processed with salt has been  made without salt. However, the food may contain sodium-containing additives, such as preservatives, leavening agents, or flavorings.  Natural.  This term has no legal meaning.  Organic.  Foods that are certified as organic have been inspected and approved  by the USDA to ensure they are produced without pesticides, fertilizers containing synthetic ingredients, bioengineering, or ionizing radiation.  Document Released: 04/02/2003 Document Revised: 03/19/2011 Document Reviewed: 10/18/2008 [facility] Patient Information 2012 [place], [place].

## 2011-12-04 NOTE — Progress Notes (Signed)
  Subjective:    Patient ID: Douglas Reeves, male    DOB: 10/24/1960, 51 y.o.   MRN: [id number]  HPI   51 year old patient who is seen today for followup. He has type 2 diabetes. 3 months ago hemoglobin A1c 7.3. He is on triple oral therapy. There's been some modest weight gain over the past 3 months. He feels blood sugars have trended up slightly.  Wt Readings from Last 3 Encounters:  12/04/11 290 lb (131.543 kg)  09/04/11 284 lb (128.822 kg)  06/05/11 282 lb (127.914 kg)    Review of Systems  Constitutional: Negative for fever, chills, appetite change and fatigue.  HENT: Negative for hearing loss, ear pain, congestion, sore throat, trouble swallowing, neck stiffness, dental problem, voice change and tinnitus.   Eyes: Negative for pain, discharge and visual disturbance.  Respiratory: Negative for cough, chest tightness, wheezing and stridor.   Cardiovascular: Negative for chest pain, palpitations and leg swelling.  Gastrointestinal: Negative for nausea, vomiting, abdominal pain, diarrhea, constipation, blood in stool and abdominal distention.  Genitourinary: Negative for urgency, hematuria, flank pain, discharge, difficulty urinating and genital sores.  Musculoskeletal: Negative for myalgias, back pain, joint swelling, arthralgias and gait problem.  Skin: Negative for rash.  Neurological: Negative for dizziness, syncope, speech difficulty, weakness, numbness and headaches.  Hematological: Negative for adenopathy. Does not bruise/bleed easily.  Psychiatric/Behavioral: Negative for behavioral problems and dysphoric mood. The patient is not nervous/anxious.        Objective:   Physical Exam  Constitutional: He is oriented to person, place, and time. He appears well-developed.       Obese weight 290  HENT:  Head: Normocephalic.  Right Ear: External ear normal.  Left Ear: External ear normal.  Eyes: Conjunctivae and EOM are normal.  Neck: Normal range of motion.  Cardiovascular: Normal  rate and normal heart sounds.   Pulmonary/Chest: Breath sounds normal.  Abdominal: Bowel sounds are normal.  Musculoskeletal: Normal range of motion. He exhibits no edema and no tenderness.  Neurological: He is alert and oriented to person, place, and time.  Psychiatric: He has a normal mood and affect. His behavior is normal.          Assessment & Plan:   Diabetes mellitus. Suboptimal control. Options discussed including addition of Victoza The patient will consider dietary referral Lifestyle issues addressed. Information dispensed We'll recheck in 3 months if unimproved will intensify therapy

## 2011-12-07 NOTE — Progress Notes (Signed)
Quick Note:  spoke with pt- informed of results and dr. Vernon Prey instructions ______

## 2012-03-04 ENCOUNTER — Ambulatory Visit: Payer: 59 | Admitting: Internal Medicine

## 2012-03-11 ENCOUNTER — Ambulatory Visit: Payer: 59 | Admitting: Internal Medicine

## 2012-03-24 ENCOUNTER — Ambulatory Visit (INDEPENDENT_AMBULATORY_CARE_PROVIDER_SITE_OTHER): Payer: 59 | Admitting: Internal Medicine

## 2012-03-24 ENCOUNTER — Encounter: Payer: Self-pay | Admitting: Internal Medicine

## 2012-03-24 VITALS — BP 150/86 | HR 80 | Temp 97.9°F | Resp 18 | Wt 265.0 lb

## 2012-03-24 DIAGNOSIS — E119 Type 2 diabetes mellitus without complications: Secondary | ICD-10-CM

## 2012-03-24 DIAGNOSIS — E669 Obesity, unspecified: Secondary | ICD-10-CM

## 2012-03-24 DIAGNOSIS — Z23 Encounter for immunization: Secondary | ICD-10-CM

## 2012-03-24 MED ORDER — METFORMIN HCL 1000 MG PO TABS: 1000.0000 mg | ORAL_TABLET | Freq: Two times a day (BID) | ORAL | Status: DC

## 2012-03-24 MED ORDER — PIOGLITAZONE HCL 45 MG PO TABS: 45.0000 mg | ORAL_TABLET | Freq: Every day | ORAL | Status: DC

## 2012-03-24 MED ORDER — GLIMEPIRIDE 4 MG PO TABS: 4.0000 mg | ORAL_TABLET | Freq: Every day | ORAL | Status: DC

## 2012-03-24 NOTE — Patient Instructions (Signed)
Please check your hemoglobin A1c every 3 months  You need to lose weight.  Consider a lower calorie diet and regular exercise.    It is important that you exercise regularly, at least 20 minutes 3 to 4 times per week.  If you develop chest pain or shortness of breath seek  medical attention. 

## 2012-03-24 NOTE — Progress Notes (Signed)
Subjective:    Patient ID: Douglas Reeves, male    DOB: February 01, 1961, 51 y.o.   MRN: [id number]  HPI  51 year old patient who has type 2 diabetes and exogenous obesity. He is been on triple therapy in 3 months ago his hemoglobin A1c is 9.1. Since this time he has been dieting and has lost 25 pounds in weight. Blood sugars have genetically improved and often are in the 80s. He's had no hypoglycemia. He generally feels quite well.  Past Medical History  Diagnosis Date  . Diabetes mellitus     History   Social History  . Marital Status: Married    Spouse Name: N/A    Number of Children: N/A  . Years of Education: N/A   Occupational History  . Not on file.   Social History Main Topics  . Smoking status: Never Smoker   . Smokeless tobacco: Never Used  . Alcohol Use: No  . Drug Use: Not on file  . Sexually Active: Not on file   Other Topics Concern  . Not on file   Social History Narrative  . No narrative on file    Past Surgical History  Procedure Date  . Vasectomy   . Appendectomy   . Back surgery     No family history on file.  No Known Allergies  Current Outpatient Prescriptions on File Prior to Visit  Medication Sig Dispense Refill  . glimepiride (AMARYL) 4 MG tablet Take 1 tablet (4 mg total) by mouth daily before breakfast.  90 tablet  6  . metFORMIN (GLUCOPHAGE) 1000 MG tablet Take 1 tablet (1,000 mg total) by mouth 2 (two) times daily with a meal.  180 tablet  6  . pioglitazone (ACTOS) 45 MG tablet Take 1 tablet (45 mg total) by mouth daily.  90 tablet  6    BP 150/86  Pulse 80  Temp 97.9 F (36.6 C) (Oral)  Resp 18  Wt 265 lb (120.203 kg)  SpO2 97%     Review of Systems  Constitutional: Negative for fever, chills, appetite change and fatigue.  HENT: Negative for hearing loss, ear pain, congestion, sore throat, trouble swallowing, neck stiffness, dental problem, voice change and tinnitus.   Eyes: Negative for pain, discharge and visual disturbance.   Respiratory: Negative for cough, chest tightness, wheezing and stridor.   Cardiovascular: Negative for chest pain, palpitations and leg swelling.  Gastrointestinal: Negative for nausea, vomiting, abdominal pain, diarrhea, constipation, blood in stool and abdominal distention.  Genitourinary: Negative for urgency, hematuria, flank pain, discharge, difficulty urinating and genital sores.  Musculoskeletal: Negative for myalgias, back pain, joint swelling, arthralgias and gait problem.  Skin: Negative for rash.  Neurological: Negative for dizziness, syncope, speech difficulty, weakness, numbness and headaches.  Hematological: Negative for adenopathy. Does not bruise/bleed easily.  Psychiatric/Behavioral: Negative for behavioral problems and dysphoric mood. The patient is not nervous/anxious.        Objective:   Physical Exam  Constitutional: He is oriented to person, place, and time. He appears well-developed.       Repeat blood pressure 120/78  HENT:  Head: Normocephalic.  Right Ear: External ear normal.  Left Ear: External ear normal.  Eyes: Conjunctivae normal and EOM are normal.  Neck: Normal range of motion.  Cardiovascular: Normal rate and normal heart sounds.   Pulmonary/Chest: Breath sounds normal.  Abdominal: Bowel sounds are normal.  Musculoskeletal: Normal range of motion. He exhibits no edema and no tenderness.  Neurological: He is alert and oriented to  person, place, and time.  Psychiatric: He has a normal mood and affect. His behavior is normal.          Assessment & Plan:   Diabetes mellitus type 2 Exogenous obesity  Both much improved. We'll check a hemoglobin A1c should be much improved. Continue efforts at weight loss recheck 3 months

## 2012-03-25 LAB — HEMOGLOBIN A1C: Hgb A1c MFr Bld: 6.7 % — ABNORMAL HIGH (ref 4.6–6.5)

## 2012-06-24 ENCOUNTER — Ambulatory Visit: Payer: 59 | Admitting: Internal Medicine

## 2012-07-27 ENCOUNTER — Ambulatory Visit (INDEPENDENT_AMBULATORY_CARE_PROVIDER_SITE_OTHER): Payer: 59 | Admitting: Internal Medicine

## 2012-07-27 ENCOUNTER — Encounter: Payer: Self-pay | Admitting: Internal Medicine

## 2012-07-27 VITALS — BP 128/80 | HR 101 | Temp 98.2°F | Resp 20 | Wt 279.0 lb

## 2012-07-27 DIAGNOSIS — E669 Obesity, unspecified: Secondary | ICD-10-CM

## 2012-07-27 DIAGNOSIS — E119 Type 2 diabetes mellitus without complications: Secondary | ICD-10-CM

## 2012-07-27 NOTE — Progress Notes (Signed)
  Subjective:    Patient ID: Douglas Reeves, male    DOB: 05/15/1960, 52 y.o.   MRN: [id number]  HPI  Wt Readings from Last 3 Encounters:  07/27/12 279 lb (126.554 kg)  03/24/12 265 lb (120.203 kg)  12/04/11 290 lb (131.543 kg)     Review of Systems     Objective:   Physical Exam        Assessment & Plan:

## 2012-07-27 NOTE — Patient Instructions (Signed)
Limit your sodium (Salt) intake    It is important that you exercise regularly, at least 20 minutes 3 to 4 times per week.  If you develop chest pain or shortness of breath seek  medical attention.  You need to lose weight.  Consider a lower calorie diet and regular exercise. 

## 2012-07-27 NOTE — Progress Notes (Signed)
Subjective:    Patient ID: Douglas Reeves, male    DOB: [birth year], 52 y.o.   MRN: [id number]  HPI   52 year old patient who is seen today for followup of type 2 diabetes. After considerable weight loss his hemoglobin A1c was at goal 4 months ago. Unfortunately there has been some significant weight gain over the past 3-4 months. He generally feels well. Random blood sugar this morning 160 at home.  Past Medical History  Diagnosis Date  . Diabetes mellitus     History   Social History  . Marital Status: Married    Spouse Name: N/A    Number of Children: N/A  . Years of Education: N/A   Occupational History  . Not on file.   Social History Main Topics  . Smoking status: Never Smoker   . Smokeless tobacco: Never Used  . Alcohol Use: No  . Drug Use: Not on file  . Sexually Active: Not on file   Other Topics Concern  . Not on file   Social History Narrative  . No narrative on file    Past Surgical History  Procedure Laterality Date  . Vasectomy    . Appendectomy    . Back surgery      No family history on file.  No Known Allergies  Current Outpatient Prescriptions on File Prior to Visit  Medication Sig Dispense Refill  . glimepiride (AMARYL) 4 MG tablet Take 1 tablet (4 mg total) by mouth daily before breakfast.  90 tablet  6  . metFORMIN (GLUCOPHAGE) 1000 MG tablet Take 1 tablet (1,000 mg total) by mouth 2 (two) times daily with a meal.  180 tablet  6  . pioglitazone (ACTOS) 45 MG tablet Take 1 tablet (45 mg total) by mouth daily.  90 tablet  6   No current facility-administered medications on file prior to visit.    BP 128/80  Pulse 101  Temp(Src) 98.2 F (36.8 C) (Oral)  Resp 20  Wt 279 lb (126.554 kg)  BMI 34.87 kg/m2  SpO2 97%       Review of Systems  Constitutional: Positive for unexpected weight change. Negative for fever, chills, appetite change and fatigue.  HENT: Negative for hearing loss, ear pain, congestion, sore throat, trouble  swallowing, neck stiffness, dental problem, voice change and tinnitus.   Eyes: Negative for pain, discharge and visual disturbance.  Respiratory: Negative for cough, chest tightness, wheezing and stridor.   Cardiovascular: Negative for chest pain, palpitations and leg swelling.  Gastrointestinal: Negative for nausea, vomiting, abdominal pain, diarrhea, constipation, blood in stool and abdominal distention.  Genitourinary: Negative for urgency, hematuria, flank pain, discharge, difficulty urinating and genital sores.  Musculoskeletal: Negative for myalgias, back pain, joint swelling, arthralgias and gait problem.  Skin: Negative for rash.  Neurological: Negative for dizziness, syncope, speech difficulty, weakness, numbness and headaches.  Hematological: Negative for adenopathy. Does not bruise/bleed easily.  Psychiatric/Behavioral: Negative for behavioral problems and dysphoric mood. The patient is not nervous/anxious.        Objective:   Physical Exam  Constitutional: He is oriented to person, place, and time. He appears well-developed.  Blood pressure well controlled less than 120/80  HENT:  Head: Normocephalic.  Right Ear: External ear normal.  Left Ear: External ear normal.  Eyes: Conjunctivae and EOM are normal.  Neck: Normal range of motion.  Cardiovascular: Normal rate and normal heart sounds.   Pulmonary/Chest: Breath sounds normal.  Abdominal: Bowel sounds are normal.  Musculoskeletal: Normal range of motion.  He exhibits no edema and no tenderness.  Neurological: He is alert and oriented to person, place, and time.  Psychiatric: He has a normal mood and affect. His behavior is normal.          Assessment & Plan:   Diabetes mellitus. Weight loss encouraged. We'll check a hemoglobin A1c. Reassess in 4 months Exogenous obesity

## 2012-12-02 ENCOUNTER — Ambulatory Visit: Payer: 59 | Admitting: Internal Medicine

## 2013-02-16 ENCOUNTER — Other Ambulatory Visit: Payer: Self-pay

## 2013-04-01 ENCOUNTER — Other Ambulatory Visit: Payer: Self-pay | Admitting: Internal Medicine

## 2013-07-07 ENCOUNTER — Other Ambulatory Visit: Payer: Self-pay | Admitting: Internal Medicine

## 2013-07-10 ENCOUNTER — Other Ambulatory Visit: Payer: Self-pay | Admitting: Internal Medicine

## 2014-03-17 ENCOUNTER — Other Ambulatory Visit: Payer: Self-pay | Admitting: Internal Medicine

## 2014-03-22 ENCOUNTER — Other Ambulatory Visit: Payer: Self-pay | Admitting: Internal Medicine

## 2014-05-11 ENCOUNTER — Other Ambulatory Visit: Payer: Self-pay | Admitting: Internal Medicine

## 2014-06-08 ENCOUNTER — Other Ambulatory Visit (INDEPENDENT_AMBULATORY_CARE_PROVIDER_SITE_OTHER): Payer: 59

## 2014-06-08 DIAGNOSIS — Z0000 Encounter for general adult medical examination without abnormal findings: Secondary | ICD-10-CM

## 2014-06-08 LAB — BASIC METABOLIC PANEL
BUN: 11 mg/dL (ref 6–23)
CALCIUM: 9.4 mg/dL (ref 8.4–10.5)
CO2: 32 mEq/L (ref 19–32)
Chloride: 102 mEq/L (ref 96–112)
Creatinine, Ser: 0.96 mg/dL (ref 0.40–1.50)
GFR: 105.34 mL/min (ref >60.00)
GLUCOSE: 167 mg/dL — AB (ref 70–99)
Potassium: 5.7 mEq/L — ABNORMAL HIGH (ref 3.5–5.1)
SODIUM: 138 meq/L (ref 135–145)

## 2014-06-08 LAB — CBC WITH DIFFERENTIAL/PLATELET
BASOS PCT: 0.6 % (ref 0.0–3.0)
Basophils Absolute: 0 10*3/uL (ref 0.0–0.1)
EOS PCT: 3.3 % (ref 0.0–5.0)
Eosinophils Absolute: 0.2 10*3/uL (ref 0.0–0.7)
HCT: 41.3 % (ref 39.0–52.0)
Hemoglobin: 13.8 g/dL (ref 13.0–17.0)
LYMPHS PCT: 38.7 % (ref 12.0–46.0)
Lymphs Abs: 1.9 10*3/uL (ref 0.7–4.0)
MCHC: 33.5 g/dL (ref 30.0–36.0)
MCV: 88.1 fl (ref 78.0–100.0)
MONO ABS: 0.5 10*3/uL (ref 0.1–1.0)
MONOS PCT: 9.8 % (ref 3.0–12.0)
NEUTROS PCT: 47.6 % (ref 43.0–77.0)
Neutro Abs: 2.4 10*3/uL (ref 1.4–7.7)
PLATELETS: 283 10*3/uL (ref 150.0–400.0)
RBC: 4.68 Mil/uL (ref 4.22–5.81)
RDW: 14 % (ref 11.5–15.5)
WBC: 4.9 10*3/uL (ref 4.0–10.5)

## 2014-06-08 LAB — HEPATIC FUNCTION PANEL
ALBUMIN: 4 g/dL (ref 3.5–5.2)
ALK PHOS: 73 U/L (ref 39–117)
ALT: 30 U/L (ref 0–53)
AST: 22 U/L (ref 0–37)
Bilirubin, Direct: 0.1 mg/dL (ref 0.0–0.3)
TOTAL PROTEIN: 6.7 g/dL (ref 6.0–8.3)
Total Bilirubin: 0.8 mg/dL (ref 0.2–1.2)

## 2014-06-08 LAB — LIPID PANEL
CHOL/HDL RATIO: 5
Cholesterol: 176 mg/dL (ref 0–200)
HDL: 38.5 mg/dL — AB (ref >39.00)
LDL Cholesterol: 102 mg/dL — ABNORMAL HIGH (ref 0–99)
NONHDL: 137.5
TRIGLYCERIDES: 176 mg/dL — AB (ref 0.0–149.0)
VLDL: 35.2 mg/dL (ref 0.0–40.0)

## 2014-06-08 LAB — HEMOGLOBIN A1C: HEMOGLOBIN A1C: 9.4 % — AB (ref 4.6–6.5)

## 2014-06-08 LAB — PSA: PSA: 1.01 ng/mL (ref 0.10–4.00)

## 2014-06-08 LAB — POCT URINALYSIS DIPSTICK
BILIRUBIN UA: NEGATIVE
Blood, UA: NEGATIVE
Glucose, UA: NEGATIVE
KETONES UA: NEGATIVE
Leukocytes, UA: NEGATIVE
NITRITE UA: NEGATIVE
PROTEIN UA: NEGATIVE
Spec Grav, UA: 1.015
Urobilinogen, UA: 0.2
pH, UA: 7

## 2014-06-08 LAB — MICROALBUMIN / CREATININE URINE RATIO
Creatinine,U: 135.6 mg/dL
Microalb Creat Ratio: 0.5 mg/g (ref 0.0–30.0)
Microalb, Ur: <0.7 mg/dL (ref 0.0–1.9)

## 2014-06-08 LAB — TSH: TSH: 1.91 u[IU]/mL (ref 0.35–4.50)

## 2014-06-15 ENCOUNTER — Encounter: Payer: Self-pay | Admitting: Internal Medicine

## 2014-06-15 ENCOUNTER — Ambulatory Visit (INDEPENDENT_AMBULATORY_CARE_PROVIDER_SITE_OTHER): Payer: 59 | Admitting: Internal Medicine

## 2014-06-15 VITALS — BP 146/80 | HR 92 | Temp 98.2°F | Resp 20 | Ht 74.5 in | Wt 276.0 lb

## 2014-06-15 DIAGNOSIS — E119 Type 2 diabetes mellitus without complications: Secondary | ICD-10-CM

## 2014-06-15 DIAGNOSIS — Z0000 Encounter for general adult medical examination without abnormal findings: Secondary | ICD-10-CM

## 2014-06-15 MED ORDER — DULAGLUTIDE 1.5 MG/0.5ML ~~LOC~~ SOAJ: 1.5000 mg | SUBCUTANEOUS | Status: DC

## 2014-06-15 NOTE — Progress Notes (Signed)
Subjective:    Patient ID: Douglas Reeves, male    DOB: [birth year], 54 y.o.   MRN: [id number]  HPI    54 year old patient who is seen today for a wellness exam.  He is seen infrequently but does have a history of type 2 diabetes.  He has exogenous obesity  No prior colonoscopy  Lab Results  Component Value Date   HGBA1C 9.4* 06/08/2014    Wt Readings from Last 3 Encounters:  06/15/14 276 lb (125.193 kg)  07/27/12 279 lb (126.554 kg)  03/24/12 265 lb (120.203 kg)    Allergies (verified): No Known Drug Allergies  Past History:  Past Medical History:  Exogenous obesity Diabetes mellitus, type II  Past Surgical History:  Appendectomy Vasectomy Back surgery, scoliosis  Review of Systems  Constitutional: Negative for fever, chills, activity change, appetite change and fatigue.  HENT: Negative for congestion, dental problem, ear pain, hearing loss, mouth sores, rhinorrhea, sinus pressure, sneezing, tinnitus, trouble swallowing and voice change.   Eyes: Negative for photophobia, pain, redness and visual disturbance.  Respiratory: Negative for apnea, cough, choking, chest tightness, shortness of breath and wheezing.   Cardiovascular: Negative for chest pain, palpitations and leg swelling.  Gastrointestinal: Negative for nausea, vomiting, abdominal pain, diarrhea, constipation, blood in stool, abdominal distention, anal bleeding and rectal pain.  Genitourinary: Negative for dysuria, urgency, frequency, hematuria, flank pain, decreased urine volume, discharge, penile swelling, scrotal swelling, difficulty urinating, genital sores and testicular pain.  Musculoskeletal: Negative for myalgias, back pain, joint swelling, arthralgias, gait problem, neck pain and neck stiffness.  Skin: Negative for color change, rash and wound.  Neurological: Positive for numbness (Occasional mild numbness of the feet). Negative for dizziness, tremors, seizures, syncope, facial asymmetry, speech  difficulty, weakness, light-headedness and headaches.  Hematological: Negative for adenopathy. Does not bruise/bleed easily.  Psychiatric/Behavioral: Negative for suicidal ideas, hallucinations, behavioral problems, confusion, sleep disturbance, self-injury, dysphoric mood, decreased concentration and agitation. The patient is not nervous/anxious.        Objective:   Physical Exam  Constitutional: He appears well-developed and well-nourished.  Repeat blood pressure 120/78 Weight 276  HENT:  Head: Normocephalic and atraumatic.  Right Ear: External ear normal.  Left Ear: External ear normal.  Nose: Nose normal.  Mouth/Throat: Oropharynx is clear and moist.  Eyes: Conjunctivae and EOM are normal. Pupils are equal, round, and reactive to light. No scleral icterus.  Neck: Normal range of motion. Neck supple. No JVD present. No thyromegaly present.  Cardiovascular: Regular rhythm, normal heart sounds and intact distal pulses.  Exam reveals no gallop and no friction rub.   No murmur heard. Pulmonary/Chest: Effort normal and breath sounds normal. He exhibits no tenderness.  Abdominal: Soft. Bowel sounds are normal. He exhibits no distension and no mass. There is no tenderness.  Genitourinary: Prostate normal and penis normal. Guaiac negative stool.  Musculoskeletal: Normal range of motion. He exhibits no edema or tenderness.  Lymphadenopathy:    He has no cervical adenopathy.  Neurological: He is alert. He has normal reflexes. No cranial nerve deficit. Coordination normal.  Skin: Skin is warm and dry. No rash noted.  Circular scar just to the right of the umbilicus and surgical scar involving the mid back area  Psychiatric: He has a normal mood and affect. His behavior is normal.          Assessment & Plan:  Preventive health examination Diabetes mellitus.  Poor control.  Will add GLP-1 agonist.  Nonpharmacologic measures discussed Exogenous obesity.  Dietary  information discussed.   Recheck 3 months if not improved, will set up for dietary counseling  Screening colonoscopy.  Encouraged Annual eye examination recommended

## 2014-06-15 NOTE — Patient Instructions (Signed)
It is important that you exercise regularly, at least 20 minutes 3 to 4 times per week.  If you develop chest pain or shortness of breath seek  medical attention.  You need to lose weight.  Consider a lower calorie diet and regular exercise.   Please check your hemoglobin A1c every 3 months  Please see your eye doctor yearly to check for diabetic eye damage  Schedule your colonoscopy to help detect colon cancer.  Diabetes Mellitus and Food It is important for you to manage your blood sugar (glucose) level. Your blood glucose level can be greatly affected by what you eat. Eating healthier foods in the appropriate amounts throughout the day at about the same time each day will help you control your blood glucose level. It can also help slow or prevent worsening of your diabetes mellitus. Healthy eating may even help you improve the level of your blood pressure and reach or maintain a healthy weight.  HOW CAN FOOD AFFECT ME? Carbohydrates Carbohydrates affect your blood glucose level more than any other type of food. Your dietitian will help you determine how many carbohydrates to eat at each meal and teach you how to count carbohydrates. Counting carbohydrates is important to keep your blood glucose at a healthy level, especially if you are using insulin or taking certain medicines for diabetes mellitus. Alcohol Alcohol can cause sudden decreases in blood glucose (hypoglycemia), especially if you use insulin or take certain medicines for diabetes mellitus. Hypoglycemia can be a life-threatening condition. Symptoms of hypoglycemia (sleepiness, dizziness, and disorientation) are similar to symptoms of having too much alcohol.  If your health care provider has given you approval to drink alcohol, do so in moderation and use the following guidelines:  Women should not have more than one drink per day, and men should not have more than two drinks per day. One drink is equal to:  12 oz of beer.  5  oz of wine.  1 oz of hard liquor.  Do not drink on an empty stomach.  Keep yourself hydrated. Have water, diet soda, or unsweetened iced tea.  Regular soda, juice, and other mixers might contain a lot of carbohydrates and should be counted. WHAT FOODS ARE NOT RECOMMENDED? As you make food choices, it is important to remember that all foods are not the same. Some foods have fewer nutrients per serving than other foods, even though they might have the same number of calories or carbohydrates. It is difficult to get your body what it needs when you eat foods with fewer nutrients. Examples of foods that you should avoid that are high in calories and carbohydrates but low in nutrients include:  Trans fats (most processed foods list trans fats on the Nutrition Facts label).  Regular soda.  Juice.  Candy.  Sweets, such as cake, pie, doughnuts, and cookies.  Fried foods. WHAT FOODS CAN I EAT? Have nutrient-rich foods, which will nourish your body and keep you healthy. The food you should eat also will depend on several factors, including:  The calories you need.  The medicines you take.  Your weight.  Your blood glucose level.  Your blood pressure level.  Your cholesterol level. You also should eat a variety of foods, including:  Protein, such as meat, poultry, fish, tofu, nuts, and seeds (lean animal proteins are best).  Fruits.  Vegetables.  Dairy products, such as milk, cheese, and yogurt (low fat is best).  Breads, grains, pasta, cereal, rice, and beans.  Fats such  as olive oil, trans fat-free margarine, canola oil, avocado, and olives. DOES EVERYONE WITH DIABETES MELLITUS HAVE THE SAME MEAL PLAN? Because every person with diabetes mellitus is different, there is not one meal plan that works for everyone. It is very important that you meet with a dietitian who will help you create a meal plan that is just right for you. Document Released: 12/25/2004 Document Revised:  04/04/2013 Document Reviewed: 02/24/2013 [facility] Patient Information 2015 [place], [place]. This information is not intended to replace advice given to you by your health care provider. Make sure you discuss any questions you have with your health care provider. Diabetes, Eating Away From Home Sometimes, you might eat in a restaurant or have meals that are prepared by someone else. You can enjoy eating out. However, the portions in restaurants may be much larger than needed. Listed below are some ideas to help you choose foods that will keep your blood glucose (sugar) in better control.  TIPS FOR EATING OUT  Know your meal plan and how many carbohydrate servings you should have at each meal. You may wish to carry a copy of your meal plan in your purse or wallet. Learn the foods included in each food group.  Make a list of restaurants near you that offer healthy choices. Take a copy of the carry-out menus to see what they offer. Then, you can plan what you will order ahead of time.  Become familiar with serving sizes by practicing them at home using measuring cups and spoons. Once you learn to recognize portion sizes, you will be able to correctly estimate the amount of total carbohydrate you are allowed to eat at the restaurant. Ask for a takeout box if the portion is more than you should have. When your food comes, leave the amount you should have on the plate, and put the rest in the takeout box before you start eating.  Plan ahead if your mealtime will be different from usual. Check with your caregiver to find out how to time meals and medicine if you are taking insulin.  Avoid high-fat foods, such as fried foods, cream sauces, high-fat salad dressings, or any added butter or margarine.  Do not be afraid to ask questions. Ask your server about the portion size, cooking methods, ingredients and if items can be substituted. Restaurants do not list all available items on the menu. You can ask for  your main entree to be prepared using skim milk, oil instead of butter or margarine, and without gravy or sauces. Ask your waiter or waitress to serve salad dressings, gravy, sauces, margarine, and sour cream on the side. You can then add the amount your meal plan suggests.  Add more vegetables whenever possible.  Avoid items that are labeled "jumbo," "giant," "deluxe," or "supersized."  You may want to split an entre with someone and order an extra side salad.  Watch for hidden calories in foods like croutons, bacon, or cheese.  Ask your server to take away the bread basket or chips from your table.  Order a dinner salad as an appetizer. You can eat most foods served in a restaurant. Some foods are better choices than others. Breads and Starches  Recommended: All kinds of bread (wheat, rye, white, oatmeal, [place], [place], raisin), hard or soft dinner rolls, frankfurter or hamburger buns, small bagels, small corn or whole-wheat flour tortillas.  Avoid: Frosted or glazed breads, butter rolls, egg or cheese breads, croissants, sweet rolls, pastries, coffee cake, glazed or frosted  doughnuts, muffins. Crackers  Recommended: Animal crackers, graham, rye, saltine, oyster, and matzoth crackers. Bread sticks, melba toast, rusks, pretzels, popcorn (without fat), zwieback toast.  Avoid: High-fat snack crackers or chips. Buttered popcorn. Cereals  Recommended: Hot and cold cereals. Whole grains such as oatmeal or shredded wheat are good choices.  Avoid: Sugar-coated or granola type cereals. Potatoes/Pasta/Rice/Beans  Recommended: Order baked, boiled, or mashed potatoes, rice or noodles without added fat, whole beans. Order gravies, butter, margarine, or sauces on the side so you can control the amount you add.  Avoid: Hash browns or fried potatoes. Potatoes, pasta, or rice prepared with cream or cheese sauce. Potato or pasta salads prepared with large amounts of dressing. Fried beans or fried  rice. Vegetables  Recommended: Order steamed, baked, boiled, or stewed vegetables without sauces or extra fat. Ask that sauce be served on the side. If vegetables are not listed on the menu, ask what is available.  Avoid: Vegetables prepared with cream, butter, or cheese sauce. Fried vegetables. Salad Bars  Recommended: Many of the vegetables at a salad bar are considered "free." Use lemon juice, vinegar, or low-calorie salad dressing (fewer than 20 calories per serving) as "free" dressings for your salad. Look for salad bar ingredients that have no added fat or sugar such as tomatoes, lettuce, cucumbers, broccoli, carrots, onions, and mushrooms.  Avoid: Prepared salads with large amounts of dressing, such as coleslaw, caesar salad, macaroni salad, bean salad, or carrot salad. Fruit  Recommended: Eat fresh fruit or fresh fruit salad without added dressing. A salad bar often offers fresh fruit choices, but canned fruit at a restaurant is usually packed in sugar or syrup.  Avoid: Sweetened canned or frozen fruits, plain or sweetened fruit juice. Fruit salads with dressing, sour cream, or sugar added to them. Meat and Meat Substitutes  Recommended: Order broiled, baked, roasted, or grilled meat, poultry, or fish. Trim off all visible fat. Do not eat the skin of poultry. The size stated on the menu is the raw weight. Meat shrinks by  in cooking (for example, 4 oz raw equals 3 oz cooked meat).  Avoid: Deep-fat fried meat, poultry, or fish. Breaded meats. Eggs  Recommended: Order soft, hard-cooked, poached, or scrambled eggs. Omelets may be okay, depending on what ingredients are added. Egg substitutes are also a good choice.  Avoid: Fried eggs, eggs prepared with cream or cheese sauce. Milk  Recommended: Order low-fat or fat-free milk according to your meal plan. Plain, nonfat yogurt or flavored yogurt with no sugar added may be used as a substitute for milk. Soy milk may also be  used.  Avoid: Milk shakes or sweetened milk beverages. Soups and Combination Foods  Recommended: Clear broth or consomm are "free" foods and may be used as an appetizer. Broth-based soups with fat removed count as a starch serving and are preferred over cream soups. Soups made with beans or split peas may be eaten but count as a starch.  Avoid: Fatty soups, soup made with cream, cheese soup. Combination foods prepared with excessive amounts of fat or with cream or cheese sauces. Desserts and Sweets  Recommended: Ask for fresh fruit. Sponge or angel food cake without icing, ice milk, no sugar added ice cream, sherbet, or frozen yogurt may fit into your meal plan occasionally.  Avoid: Pastries, puddings, pies, cakes with icing, custard, gelatin desserts. Fats and Oils  Recommended: Choose healthy fats such as olive oil, canola oil, or tub margarine, reduced fat or fat-free sour cream, cream  cheese, avocado, or nuts.  Avoid: Any fats in excess of your allowed portion. Deep-fried foods or any food with a large amount of fat. Note: Ask for all fats to be served on the side, and limit your portion sizes according to your meal plan. Document Released: 03/30/2005 Document Revised: 06/22/2011 Document Reviewed: 06/27/2013 [facility] Patient Information 2015 [place], [place]. This information is not intended to replace advice given to you by your health care provider. Make sure you discuss any questions you have with your health care provider. Health Maintenance A healthy lifestyle and preventative care can promote health and wellness.  Maintain regular health, dental, and eye exams.  Eat a healthy diet. Foods like vegetables, fruits, whole grains, low-fat dairy products, and lean protein foods contain the nutrients you need and are low in calories. Decrease your intake of foods high in solid fats, added sugars, and salt. Get information about a proper diet from your health care provider, if  necessary.  Regular physical exercise is one of the most important things you can do for your health. Most adults should get at least 150 minutes of moderate-intensity exercise (any activity that increases your heart rate and causes you to sweat) each week. In addition, most adults need muscle-strengthening exercises on 2 or more days a week.   Maintain a healthy weight. The body mass index (BMI) is a screening tool to identify possible weight problems. It provides an estimate of body fat based on height and weight. Your health care provider can find your BMI and can help you achieve or maintain a healthy weight. For males 20 years and older:  A BMI below 18.5 is considered underweight.  A BMI of 18.5 to 24.9 is normal.  A BMI of 25 to 29.9 is considered overweight.  A BMI of 30 and above is considered obese.  Maintain normal blood lipids and cholesterol by exercising and minimizing your intake of saturated fat. Eat a balanced diet with plenty of fruits and vegetables. Blood tests for lipids and cholesterol should begin at age 65 and be repeated every 5 years. If your lipid or cholesterol levels are high, you are over age 91, or you are at high risk for heart disease, you may need your cholesterol levels checked more frequently.Ongoing high lipid and cholesterol levels should be treated with medicines if diet and exercise are not working.  If you smoke, find out from your health care provider how to quit. If you do not use tobacco, do not start.  Lung cancer screening is recommended for adults aged 13-80 years who are at high risk for developing lung cancer because of a history of smoking. A yearly low-dose CT scan of the lungs is recommended for people who have at least a 30-pack-year history of smoking and are current smokers or have quit within the past 15 years. A pack year of smoking is smoking an average of 1 pack of cigarettes a day for 1 year (for example, a 30-pack-year history of  smoking could mean smoking 1 pack a day for 30 years or 2 packs a day for 15 years). Yearly screening should continue until the smoker has stopped smoking for at least 15 years. Yearly screening should be stopped for people who develop a health problem that would prevent them from having lung cancer treatment.  If you choose to drink alcohol, do not have more than 2 drinks per day. One drink is considered to be 12 oz (360 mL) of beer, 5 oz (150  mL) of wine, or 1.5 oz (45 mL) of liquor.  Avoid the use of street drugs. Do not share needles with anyone. Ask for help if you need support or instructions about stopping the use of drugs.  High blood pressure causes heart disease and increases the risk of stroke. Blood pressure should be checked at least every 1-2 years. Ongoing high blood pressure should be treated with medicines if weight loss and exercise are not effective.  If you are 30-59 years old, ask your health care provider if you should take aspirin to prevent heart disease.  Diabetes screening involves taking a blood sample to check your fasting blood sugar level. This should be done once every 3 years after age 19 if you are at a normal weight and without risk factors for diabetes. Testing should be considered at a younger age or be carried out more frequently if you are overweight and have at least 1 risk factor for diabetes.  Colorectal cancer can be detected and often prevented. Most routine colorectal cancer screening begins at the age of 95 and continues through age 52. However, your health care provider may recommend screening at an earlier age if you have risk factors for colon cancer. On a yearly basis, your health care provider may provide home test kits to check for hidden blood in the stool. A small camera at the end of a tube may be used to directly examine the colon (sigmoidoscopy or colonoscopy) to detect the earliest forms of colorectal cancer. Talk to your health care provider about  this at age 46 when routine screening begins. A direct exam of the colon should be repeated every 5-10 years through age 30, unless early forms of precancerous polyps or small growths are found.  People who are at an increased risk for hepatitis B should be screened for this virus. You are considered at high risk for hepatitis B if:  You were born in a country where hepatitis B occurs often. Talk with your health care provider about which countries are considered high risk.  Your parents were born in a high-risk country and you have not received a shot to protect against hepatitis B (hepatitis B vaccine).  You have HIV or AIDS.  You use needles to inject street drugs.  You live with, or have sex with, someone who has hepatitis B.  You are a man who has sex with other men (MSM).  You get hemodialysis treatment.  You take certain medicines for conditions like cancer, organ transplantation, and autoimmune conditions.  Hepatitis C blood testing is recommended for all people born from [birth year] through 1964 and any individual with known risk factors for hepatitis C.  Healthy men should no longer receive prostate-specific antigen (PSA) blood tests as part of routine cancer screening. Talk to your health care provider about prostate cancer screening.  Testicular cancer screening is not recommended for adolescents or adult males who have no symptoms. Screening includes self-exam, a health care provider exam, and other screening tests. Consult with your health care provider about any symptoms you have or any concerns you have about testicular cancer.  Practice safe sex. Use condoms and avoid high-risk sexual practices to reduce the spread of sexually transmitted infections (STIs).  You should be screened for STIs, including gonorrhea and chlamydia if:  You are sexually active and are younger than 24 years.  You are older than 24 years, and your health care provider tells you that you are at risk  for this  type of infection.  Your sexual activity has changed since you were last screened, and you are at an increased risk for chlamydia or gonorrhea. Ask your health care provider if you are at risk.  If you are at risk of being infected with HIV, it is recommended that you take a prescription medicine daily to prevent HIV infection. This is called pre-exposure prophylaxis (PrEP). You are considered at risk if:  You are a man who has sex with other men (MSM).  You are a heterosexual man who is sexually active with multiple partners.  You take drugs by injection.  You are sexually active with a partner who has HIV.  Talk with your health care provider about whether you are at high risk of being infected with HIV. If you choose to begin PrEP, you should first be tested for HIV. You should then be tested every 3 months for as long as you are taking PrEP.  Use sunscreen. Apply sunscreen liberally and repeatedly throughout the day. You should seek shade when your shadow is shorter than you. Protect yourself by wearing long sleeves, pants, a wide-brimmed hat, and sunglasses year round whenever you are outdoors.  Tell your health care provider of new moles or changes in moles, especially if there is a change in shape or color. Also, tell your health care provider if a mole is larger than the size of a pencil eraser.  A one-time screening for abdominal aortic aneurysm (AAA) and surgical repair of large AAAs by ultrasound is recommended for men aged 34-75 years who are current or former smokers.  Stay current with your vaccines (immunizations). Document Released: 09/26/2007 Document Revised: 04/04/2013 Document Reviewed: 08/25/2010 [facility] Patient Information 2015 [place], [place]. This information is not intended to replace advice given to you by your health care provider. Make sure you discuss any questions you have with your health care provider.

## 2014-06-15 NOTE — Progress Notes (Signed)
Pre visit review using our clinic review tool, if applicable. No additional management support is needed unless otherwise documented below in the visit note. 

## 2014-06-18 ENCOUNTER — Encounter: Payer: Self-pay | Admitting: Internal Medicine

## 2014-07-14 ENCOUNTER — Other Ambulatory Visit: Payer: Self-pay | Admitting: Internal Medicine

## 2014-08-06 ENCOUNTER — Ambulatory Visit (AMBULATORY_SURGERY_CENTER): Payer: Self-pay | Admitting: *Deleted

## 2014-08-06 VITALS — Ht 74.0 in | Wt 283.6 lb

## 2014-08-06 DIAGNOSIS — Z1211 Encounter for screening for malignant neoplasm of colon: Secondary | ICD-10-CM

## 2014-08-06 MED ORDER — NA SULFATE-K SULFATE-MG SULF 17.5-3.13-1.6 GM/177ML PO SOLN: ORAL | Status: DC

## 2014-08-06 NOTE — Progress Notes (Signed)
No allergies to eggs or soy. No problems with anesthesia.  Pt given Emmi instructions for colonoscopy  No oxygen use  No diet drug use  

## 2014-08-20 ENCOUNTER — Ambulatory Visit (AMBULATORY_SURGERY_CENTER): Payer: 59 | Admitting: Internal Medicine

## 2014-08-20 ENCOUNTER — Other Ambulatory Visit: Payer: Self-pay | Admitting: Internal Medicine

## 2014-08-20 ENCOUNTER — Encounter: Payer: Self-pay | Admitting: Internal Medicine

## 2014-08-20 VITALS — BP 144/84 | HR 78 | Temp 96.1°F | Resp 22 | Ht 74.0 in | Wt 283.0 lb

## 2014-08-20 DIAGNOSIS — Z1211 Encounter for screening for malignant neoplasm of colon: Secondary | ICD-10-CM | POA: Diagnosis present

## 2014-08-20 DIAGNOSIS — D122 Benign neoplasm of ascending colon: Secondary | ICD-10-CM

## 2014-08-20 DIAGNOSIS — D121 Benign neoplasm of appendix: Secondary | ICD-10-CM

## 2014-08-20 DIAGNOSIS — K635 Polyp of colon: Secondary | ICD-10-CM | POA: Diagnosis not present

## 2014-08-20 DIAGNOSIS — K529 Noninfective gastroenteritis and colitis, unspecified: Secondary | ICD-10-CM | POA: Diagnosis not present

## 2014-08-20 LAB — GLUCOSE, CAPILLARY
Glucose-Capillary: 88 mg/dL (ref 70–99)
Glucose-Capillary: 88 mg/dL (ref 70–99)

## 2014-08-20 MED ORDER — SODIUM CHLORIDE 0.9 % IV SOLN
500.0000 mL | INTRAVENOUS | Status: DC
Start: 2014-08-20 — End: 2014-08-20

## 2014-08-20 NOTE — Op Note (Signed)
[place]  [address]. [place], [address]   COLONOSCOPY PROCEDURE REPORT  PATIENT: Douglas, Reeves  MR#: [id number] BIRTHDATE: 07/26/1960 , 6  yrs. old GENDER: male ENDOSCOPIST: Jerene Bears, MD REFERRED HG:[id number] Douglas Reeves, M.D. PROCEDURE DATE:  08/20/2014 PROCEDURE:   Colonoscopy with biopsy, Colonoscopy with cold biopsy polypectomy, and Colonoscopy, screening First Screening Colonoscopy - Avg.  risk and is 50 yrs.  old or older Yes.  Prior Negative Screening - Now for repeat screening. N/A  History of Adenoma - Now for follow-up colonoscopy & has been > or = to 3 yrs.  N/A  Polyps Removed Today ASA CLASS:   Class II INDICATIONS:Screening for colonic neoplasia and Colorectal Neoplasm Risk Assessment for this procedure is average risk. MEDICATIONS: Monitored anesthesia care and Propofol 200 mg IV  DESCRIPTION OF PROCEDURE:   After the risks benefits and alternatives of the procedure were thoroughly explained, informed consent was obtained.  The digital rectal exam revealed no rectal mass.   The LB [id number]  endoscope was introduced through the anus and advanced to the cecum, which was identified by both the appendix and ileocecal valve. No adverse events experienced. The quality of the prep was good.  (MoviPrep was used)  The instrument was then slowly withdrawn as the colon was fully examined.  COLON FINDINGS: A possible, approximately, 1cm subepithelial lesion was located at the cecum at appendical orifice.  Multiple biopsies of the lesion were performed using cold forceps.   A sessile polyp measuring 3 mm in size was found in the ascending colon.  A polypectomy was performed with cold forceps.  The resection was complete, the polyp tissue was completely retrieved and sent to histology.   There was mild diverticulosis noted in the descending colon and sigmoid colon.  Retroflexed views revealed internal hemorrhoids. The time to cecum = 3.3  Withdrawal time = 15.1   The scope was withdrawn and the procedure completed. COMPLICATIONS: There were no immediate complications.  ENDOSCOPIC IMPRESSION: 1.   Possible 1cm subepithelial lesion was located at appendical orifice; multiple biopsies of the lesion were performed 2.   Sessile polyp was found in the ascending colon; polypectomy was performed with cold forceps 3.   Mild diverticulosis was noted in the descending colon and sigmoid colon  RECOMMENDATIONS: 1.  Await pathology results 2.  High fiber diet 3.  Timing of repeat colonoscopy will be determined by pathology findings. 4.  You will receive a letter within 1-2 weeks with the results of your biopsy as well as final recommendations.  Please call my office if you have not received a letter after 3 weeks.  eSigned:  Jerene Bears, MD 08/20/2014 8:37 AM   cc: Marletta Lor, MD and The Patient

## 2014-08-20 NOTE — Patient Instructions (Signed)
Multiple biopsies taken in colon and colon polyp x 1 removed today. Diverticulosis seen. Try to follow high fiber diet. Handouts given on polyps,diverticulosis and high fiber diet. Result letter will come in your mail in about 1-2 weeks. Call us with any questions or concerns. Thank you.  YOU HAD AN ENDOSCOPIC PROCEDURE TODAY AT [facility]:   Refer to the procedure report that was given to you for any specific questions about what was found during the examination.  If the procedure report does not answer your questions, please call your gastroenterologist to clarify.  If you requested that your care partner not be given the details of your procedure findings, then the procedure report has been included in a sealed envelope for you to review at your convenience later.  YOU SHOULD EXPECT: Some feelings of bloating in the abdomen. Passage of more gas than usual.  Walking can help get rid of the air that was put into your GI tract during the procedure and reduce the bloating. If you had a lower endoscopy (such as a colonoscopy or flexible sigmoidoscopy) you may notice spotting of blood in your stool or on the toilet paper. If you underwent a bowel prep for your procedure, you may not have a normal bowel movement for a few days.  Please Note:  You might notice some irritation and congestion in your nose or some drainage.  This is from the oxygen used during your procedure.  There is no need for concern and it should clear up in a day or so.  SYMPTOMS TO REPORT IMMEDIATELY:   Following lower endoscopy (colonoscopy or flexible sigmoidoscopy):  Excessive amounts of blood in the stool  Significant tenderness or worsening of abdominal pains  Swelling of the abdomen that is new, acute  Fever of 100F or higher   For urgent or emergent issues, a gastroenterologist can be reached at any hour by calling [phone].   DIET: Your first meal following the procedure should be a small meal  and then it is ok to progress to your normal diet. Heavy or fried foods are harder to digest and may make you feel nauseous or bloated.  Likewise, meals heavy in dairy and vegetables can increase bloating.  Drink plenty of fluids but you should avoid alcoholic beverages for 24 hours.  ACTIVITY:  You should plan to take it easy for the rest of today and you should NOT DRIVE or use heavy machinery until tomorrow (because of the sedation medicines used during the test).    FOLLOW UP: Our staff will call the number listed on your records the next business day following your procedure to check on you and address any questions or concerns that you may have regarding the information given to you following your procedure. If we do not reach you, we will leave a message.  However, if you are feeling well and you are not experiencing any problems, there is no need to return our call.  We will assume that you have returned to your regular daily activities without incident.  If any biopsies were taken you will be contacted by phone or by letter within the next 1-3 weeks.  Please call us at [phone] if you have not heard about the biopsies in 3 weeks.    SIGNATURES/CONFIDENTIALITY: You and/or your care partner have signed paperwork which will be entered into your electronic medical record.  These signatures attest to the fact that that the information above on your After  Visit Summary has been reviewed and is understood.  Full responsibility of the confidentiality of this discharge information lies with you and/or your care-partner. 

## 2014-08-20 NOTE — Progress Notes (Signed)
A/ox3 pleased with MAC, report to [place]

## 2014-08-20 NOTE — Progress Notes (Signed)
Called to room to assist during endoscopic procedure.  Patient ID and intended procedure confirmed with present staff. Received instructions for my participation in the procedure from the performing physician.  

## 2014-08-21 ENCOUNTER — Telehealth: Payer: Self-pay

## 2014-08-21 NOTE — Telephone Encounter (Signed)
  Follow up Call-  Call back number 08/20/2014  Post procedure Call Back phone  # [phone]  Permission to leave phone message Yes     Patient questions:  Do you have a fever, pain , or abdominal swelling? No. Pain Score  0 *  Have you tolerated food without any problems? Yes.    Have you been able to return to your normal activities? Yes.    Do you have any questions about your discharge instructions: Diet   No. Medications  No. Follow up visit  No.  Do you have questions or concerns about your Care? No.  Actions: * If pain score is 4 or above: No action needed, pain <4.  No problems per the pt. maw

## 2014-08-29 ENCOUNTER — Encounter: Payer: Self-pay | Admitting: Internal Medicine

## 2014-08-30 ENCOUNTER — Other Ambulatory Visit: Payer: Self-pay

## 2014-08-30 DIAGNOSIS — K388 Other specified diseases of appendix: Secondary | ICD-10-CM

## 2014-09-05 ENCOUNTER — Ambulatory Visit: Payer: 59 | Admitting: Physician Assistant

## 2014-09-05 ENCOUNTER — Other Ambulatory Visit (INDEPENDENT_AMBULATORY_CARE_PROVIDER_SITE_OTHER): Payer: 59

## 2014-09-05 DIAGNOSIS — K388 Other specified diseases of appendix: Secondary | ICD-10-CM

## 2014-09-05 DIAGNOSIS — K389 Disease of appendix, unspecified: Secondary | ICD-10-CM | POA: Diagnosis not present

## 2014-09-05 LAB — BASIC METABOLIC PANEL
BUN: 15 mg/dL (ref 6–23)
CHLORIDE: 101 meq/L (ref 96–112)
CO2: 29 mEq/L (ref 19–32)
Calcium: 9.3 mg/dL (ref 8.4–10.5)
Creatinine, Ser: 1.07 mg/dL (ref 0.40–1.50)
GFR: 92.86 mL/min (ref >60.00)
Glucose, Bld: 226 mg/dL — ABNORMAL HIGH (ref 70–99)
POTASSIUM: 4.2 meq/L (ref 3.5–5.1)
Sodium: 136 mEq/L (ref 135–145)

## 2014-09-06 ENCOUNTER — Ambulatory Visit (INDEPENDENT_AMBULATORY_CARE_PROVIDER_SITE_OTHER)
Admission: RE | Admit: 2014-09-06 | Discharge: 2014-09-06 | Disposition: A | Discharge status: Home / Self Care | Payer: 59 | Source: Ambulatory Visit | Attending: Internal Medicine | Admitting: Internal Medicine

## 2014-09-06 DIAGNOSIS — K389 Disease of appendix, unspecified: Secondary | ICD-10-CM

## 2014-09-06 DIAGNOSIS — K388 Other specified diseases of appendix: Secondary | ICD-10-CM

## 2014-09-06 MED ORDER — IOHEXOL 300 MG/ML  SOLN
100.0000 mL | Freq: Once | INTRAMUSCULAR | Status: AC | PRN
  Administered 2014-09-06: 100 mL via INTRAVENOUS

## 2014-09-14 ENCOUNTER — Ambulatory Visit: Payer: 59 | Admitting: Internal Medicine

## 2014-10-08 ENCOUNTER — Other Ambulatory Visit: Payer: Self-pay

## 2015-01-02 ENCOUNTER — Other Ambulatory Visit: Payer: Self-pay | Admitting: Internal Medicine

## 2015-09-09 ENCOUNTER — Other Ambulatory Visit: Payer: Self-pay | Admitting: Internal Medicine

## 2015-09-17 ENCOUNTER — Other Ambulatory Visit: Payer: Self-pay | Admitting: Internal Medicine

## 2015-09-30 ENCOUNTER — Telehealth: Payer: Self-pay | Admitting: Internal Medicine

## 2015-09-30 NOTE — Telephone Encounter (Signed)
Pt need new Rx for trulicity,glimepiride,medformin and pioglitazone     Pharm:  [facility]

## 2015-09-30 NOTE — Telephone Encounter (Signed)
Pt needs to be seen this week, has not been seen since 06/2014 and is Diabetic. Please schedule

## 2015-10-01 NOTE — Telephone Encounter (Signed)
Pt has been sch

## 2015-10-01 NOTE — Telephone Encounter (Signed)
May I use 4:15 SDA slots on Friday?

## 2015-10-04 ENCOUNTER — Ambulatory Visit (INDEPENDENT_AMBULATORY_CARE_PROVIDER_SITE_OTHER): Payer: 59 | Admitting: Internal Medicine

## 2015-10-04 ENCOUNTER — Encounter: Payer: Self-pay | Admitting: Internal Medicine

## 2015-10-04 VITALS — BP 144/80 | HR 95 | Temp 98.3°F | Resp 20 | Ht 74.0 in | Wt 280.0 lb

## 2015-10-04 DIAGNOSIS — E119 Type 2 diabetes mellitus without complications: Secondary | ICD-10-CM

## 2015-10-04 DIAGNOSIS — E669 Obesity, unspecified: Secondary | ICD-10-CM

## 2015-10-04 LAB — CBC WITH DIFFERENTIAL/PLATELET
Basophils Absolute: 0 cells/uL (ref 0–200)
Basophils Relative: 0 %
Eosinophils Absolute: 225 cells/uL (ref 15–500)
Eosinophils Relative: 3 %
HCT: 46.5 % (ref 38.5–50.0)
Hemoglobin: 15.9 g/dL (ref 13.2–17.1)
LYMPHS PCT: 41 %
Lymphs Abs: 3075 cells/uL (ref 850–3900)
MCH: 29.8 pg (ref 27.0–33.0)
MCHC: 34.2 g/dL (ref 32.0–36.0)
MCV: 87.1 fL (ref 80.0–100.0)
MONOS PCT: 7 %
MPV: 11.4 fL (ref 7.5–12.5)
Monocytes Absolute: 525 cells/uL (ref 200–950)
Neutro Abs: 3675 cells/uL (ref 1500–7800)
Neutrophils Relative %: 49 %
PLATELETS: 282 10*3/uL (ref 140–400)
RBC: 5.34 MIL/uL (ref 4.20–5.80)
RDW: 13.9 % (ref 11.0–15.0)
WBC: 7.5 10*3/uL (ref 3.8–10.8)

## 2015-10-04 LAB — TSH: TSH: 2.35 m[IU]/L (ref 0.40–4.50)

## 2015-10-04 MED ORDER — DULAGLUTIDE 1.5 MG/0.5ML ~~LOC~~ SOAJ: 1.5000 mg | SUBCUTANEOUS | Status: DC

## 2015-10-04 MED ORDER — METFORMIN HCL 1000 MG PO TABS: 1000.0000 mg | ORAL_TABLET | Freq: Two times a day (BID) | ORAL | Status: DC

## 2015-10-04 MED ORDER — PIOGLITAZONE HCL 45 MG PO TABS: 45.0000 mg | ORAL_TABLET | Freq: Every day | ORAL | Status: DC

## 2015-10-04 MED ORDER — GLIMEPIRIDE 4 MG PO TABS: 4.0000 mg | ORAL_TABLET | Freq: Every day | ORAL | Status: DC

## 2015-10-04 NOTE — Addendum Note (Signed)
Addended by: Gari Crown D on: 10/04/2015 05:09 PM   Modules accepted: Orders

## 2015-10-04 NOTE — Patient Instructions (Signed)

## 2015-10-04 NOTE — Addendum Note (Signed)
Addended by: Gari Crown D on: 10/04/2015 05:03 PM   Modules accepted: Orders

## 2015-10-04 NOTE — Addendum Note (Signed)
Addended by: Gari Crown D on: 10/04/2015 05:17 PM   Modules accepted: Orders

## 2015-10-04 NOTE — Progress Notes (Signed)
Pre visit review using our clinic review tool, if applicable. No additional management support is needed unless otherwise documented below in the visit note. 

## 2015-10-04 NOTE — Progress Notes (Signed)
   Subjective:    Patient ID: Douglas Reeves, male    DOB: Feb 21, 1961, 55 y.o.   MRN: [id number]  HPI  55 year old patient who has type 2 diabetes.  He has not been seen in over one year.  At the time of his last encounter.  Hemoglobin A1c was poorly controlled and GL P1 agonist therapy started.  Patient has been out of both Actos and Trulicity for the past month.  For the past month.  He states blood sugars have ranged from 142 is high as 250.  He does state that while on all 4 medications, he had much better glycemic control.  There has been some modest weight gain. At the time of his last exam.  He was referred for colonoscopy  No recent eye examination  He generally feels well  Review of Systems  Constitutional: Negative for fever, chills, appetite change and fatigue.  HENT: Negative for congestion, dental problem, ear pain, hearing loss, sore throat, tinnitus, trouble swallowing and voice change.   Eyes: Negative for pain, discharge and visual disturbance.  Respiratory: Negative for cough, chest tightness, wheezing and stridor.   Cardiovascular: Negative for chest pain, palpitations and leg swelling.  Gastrointestinal: Negative for nausea, vomiting, abdominal pain, diarrhea, constipation, blood in stool and abdominal distention.  Genitourinary: Negative for urgency, hematuria, flank pain, discharge, difficulty urinating and genital sores.  Musculoskeletal: Negative for myalgias, back pain, joint swelling, arthralgias, gait problem and neck stiffness.  Skin: Negative for rash.  Neurological: Negative for dizziness, syncope, speech difficulty, weakness, numbness and headaches.  Hematological: Negative for adenopathy. Does not bruise/bleed easily.  Psychiatric/Behavioral: Negative for behavioral problems and dysphoric mood. The patient is not nervous/anxious.        Objective:   Physical Exam  Constitutional: He is oriented to person, place, and time. He appears well-developed.   Obese Blood pressure 126/80  HENT:  Head: Normocephalic.  Right Ear: External ear normal.  Left Ear: External ear normal.  Eyes: Conjunctivae and EOM are normal.  Neck: Normal range of motion.  Cardiovascular: Normal rate and normal heart sounds.   Pulmonary/Chest: Breath sounds normal.  Abdominal: Bowel sounds are normal.  Musculoskeletal: Normal range of motion. He exhibits no edema or tenderness.  Neurological: He is alert and oriented to person, place, and time.  Psychiatric: He has a normal mood and affect. His behavior is normal.          Assessment & Plan:   Diabetes mellitus.  Will check hemoglobin A1c, lipid profile, urine for microalbumin, and general lab.  Will resume all medications.  Samples and new prescriptions dispensed Obesity.  Weight loss encouraged.  Will reassess in 3 months.  Will consider referral to dietary  Nyoka Cowden, MD

## 2015-10-05 LAB — COMPREHENSIVE METABOLIC PANEL
ALBUMIN: 4.1 g/dL (ref 3.6–5.1)
ALK PHOS: 91 U/L (ref 40–115)
ALT: 45 U/L (ref 9–46)
AST: 28 U/L (ref 10–35)
BUN: 14 mg/dL (ref 7–25)
CALCIUM: 9.6 mg/dL (ref 8.6–10.3)
CO2: 26 mmol/L (ref 20–31)
Chloride: 100 mmol/L (ref 98–110)
Creat: 0.99 mg/dL (ref 0.70–1.33)
GLUCOSE: 151 mg/dL — AB (ref 65–99)
POTASSIUM: 4.7 mmol/L (ref 3.5–5.3)
Sodium: 135 mmol/L (ref 135–146)
TOTAL PROTEIN: 7 g/dL (ref 6.1–8.1)
Total Bilirubin: 0.6 mg/dL (ref 0.2–1.2)

## 2015-10-05 LAB — LIPID PANEL
Cholesterol: 167 mg/dL (ref 125–200)
HDL: 40 mg/dL (ref >=40)
LDL Cholesterol: 48 mg/dL (ref <130)
TRIGLYCERIDES: 395 mg/dL — AB (ref <150)
Total CHOL/HDL Ratio: 4.2 Ratio (ref <=5.0)
VLDL: 79 mg/dL — ABNORMAL HIGH (ref <30)

## 2015-10-05 LAB — HEMOGLOBIN A1C
HEMOGLOBIN A1C: 11.9 % — AB (ref <5.7)
Mean Plasma Glucose: 295 mg/dL

## 2015-10-05 LAB — MICROALBUMIN / CREATININE URINE RATIO
Creatinine, Urine: 296 mg/dL (ref 20–370)
MICROALB UR: 0.6 mg/dL
Microalb Creat Ratio: 2 mcg/mg creat (ref <30)

## 2015-10-08 ENCOUNTER — Telehealth: Payer: Self-pay | Admitting: Internal Medicine

## 2015-10-08 NOTE — Telephone Encounter (Signed)
Pt is calling Douglas Reeves back for blood work results

## 2015-10-08 NOTE — Telephone Encounter (Signed)
See result note.  

## 2016-01-02 ENCOUNTER — Ambulatory Visit (INDEPENDENT_AMBULATORY_CARE_PROVIDER_SITE_OTHER): Payer: 59 | Admitting: Internal Medicine

## 2016-01-02 ENCOUNTER — Encounter: Payer: Self-pay | Admitting: Internal Medicine

## 2016-01-02 VITALS — BP 140/80 | HR 99 | Temp 98.6°F | Resp 20 | Ht 74.0 in | Wt 291.0 lb

## 2016-01-02 DIAGNOSIS — E119 Type 2 diabetes mellitus without complications: Secondary | ICD-10-CM

## 2016-01-02 DIAGNOSIS — Z23 Encounter for immunization: Secondary | ICD-10-CM | POA: Diagnosis not present

## 2016-01-02 DIAGNOSIS — E669 Obesity, unspecified: Secondary | ICD-10-CM

## 2016-01-02 NOTE — Progress Notes (Signed)
Subjective:    Patient ID: Douglas Reeves, male    DOB: 1960-11-13, 55 y.o.   MRN: [id number]  HPI Lab Results  Component Value Date   HGBA1C 11.9 (H) 10/04/2015   55 year old patient who is seen today for follow-up of poorly controlled diabetes.  3 months ago.  Hemoglobin A1c was markedly elevated, but he was not compliant with his medications.  He states his glycemic control is much improved.  In spite of some weight gain  Wt Readings from Last 3 Encounters:  01/02/16 291 lb (132 kg)  10/04/15 280 lb (127 kg)  08/20/14 283 lb (128.4 kg)   He states that he has been compliant with all his medications.  He states blood sugars have occasionally been less than 90.  He states the following lunch earlier today.  Blood sugar was 120.  He generally feels well  Past Medical History:  Diagnosis Date  . Diabetes mellitus      Social History   Social History  . Marital status: Married    Spouse name: N/A  . Number of children: N/A  . Years of education: N/A   Occupational History  . Not on file.   Social History Main Topics  . Smoking status: Never Smoker  . Smokeless tobacco: Never Used  . Alcohol use No  . Drug use: No  . Sexual activity: Not on file   Other Topics Concern  . Not on file   Social History Narrative  . No narrative on file    Past Surgical History:  Procedure Laterality Date  . APPENDECTOMY  1972  . [place]  . VASECTOMY  2000    Family History  Problem Relation Age of Onset  . Colon cancer Neg Hx     No Known Allergies  Current Outpatient Prescriptions on File Prior to Visit  Medication Sig Dispense Refill  . Dulaglutide (TRULICITY) 1.5 0000000 SOPN Inject 1.5 mg into the skin once a week. 3 pen 6  . glimepiride (AMARYL) 4 MG tablet Take 1 tablet (4 mg total) by mouth daily with breakfast. 90 tablet 3  . metFORMIN (GLUCOPHAGE) 1000 MG tablet Take 1 tablet (1,000 mg total) by mouth 2 (two) times daily with a meal. 180 tablet 3  .  pioglitazone (ACTOS) 45 MG tablet Take 1 tablet (45 mg total) by mouth daily. Reported on 10/04/2015 90 tablet 4   No current facility-administered medications on file prior to visit.     BP 140/80 (BP Location: Left Arm, Patient Position: Sitting, Cuff Size: Large)   Pulse 99   Temp 98.6 F (37 C) (Oral)   Resp 20   Ht 6\' 2"  (1.88 m)   Wt 291 lb (132 kg)   SpO2 97%   BMI 37.36 kg/m      Review of Systems  Constitutional: Positive for unexpected weight change.       Objective:   Physical Exam  Constitutional: He is oriented to person, place, and time. He appears well-developed.  HENT:  Head: Normocephalic.  Right Ear: External ear normal.  Left Ear: External ear normal.  Eyes: Conjunctivae and EOM are normal.  Neck: Normal range of motion.  Cardiovascular: Normal rate and normal heart sounds.   Pulmonary/Chest: Breath sounds normal.  Abdominal: Bowel sounds are normal.  Musculoskeletal: Normal range of motion. He exhibits no edema or tenderness.  Neurological: He is alert and oriented to person, place, and time.  Psychiatric: He has a normal mood and affect.  His behavior is normal.          Assessment & Plan:   Diabetes mellitus.  Will check hemoglobin A1c.  Patient has been much more compliant with his medications and states his glycemic control has been much improved.  In spite of some significant weight gain. Likely will need dietary/endocrine referral with [facility] of insulin therapy Obesity/weight gain  Nyoka Cowden

## 2016-01-02 NOTE — Patient Instructions (Signed)
Limit your sodium (Salt) intake   Please check your hemoglobin A1c every 3 months    It is important that you exercise regularly, at least 20 minutes 3 to 4 times per week.  If you develop chest pain or shortness of breath seek  medical attention.  You need to lose weight.  Consider a lower calorie diet and regular exercise. 

## 2016-01-03 ENCOUNTER — Ambulatory Visit: Payer: 59 | Admitting: Internal Medicine

## 2016-01-03 LAB — HEMOGLOBIN A1C: HEMOGLOBIN A1C: 8.3 % — AB (ref 4.6–6.5)

## 2016-01-07 ENCOUNTER — Other Ambulatory Visit: Payer: Self-pay | Admitting: *Deleted

## 2016-01-07 MED ORDER — EMPAGLIFLOZIN 25 MG PO TABS: 25.0000 mg | ORAL_TABLET | Freq: Every day | ORAL | 5 refills | Status: DC

## 2016-01-10 IMAGING — CT CT ABD-PELV W/ CM
2 of 5 series · 17 of 46 positions shown, 19 images · IV contrast (Omnipaque 300)
Comparison: 03/04/2011 CT

CLINICAL DATA: Appendiceal lesion at colonoscopy, occasional right
lower quadrant pain. Per review of the medical record, the patient
has a history of appendectomy 0438.

EXAM:
CT ABDOMEN AND PELVIS WITH CONTRAST
TECHNIQUE: Multidetector CT imaging of the abdomen and pelvis was performed
using the standard protocol following bolus administration of
intravenous contrast.
CONTRAST:  100mL OMNIPAQUE IOHEXOL 300 MG/ML  SOLN

[Series 2: abd/ pel 5mm · axial · 0.80mm/px · z∈[-534,-84]mm · 14 of 102 slices shown, 16 images]
[im 6/102  soft-tissue]
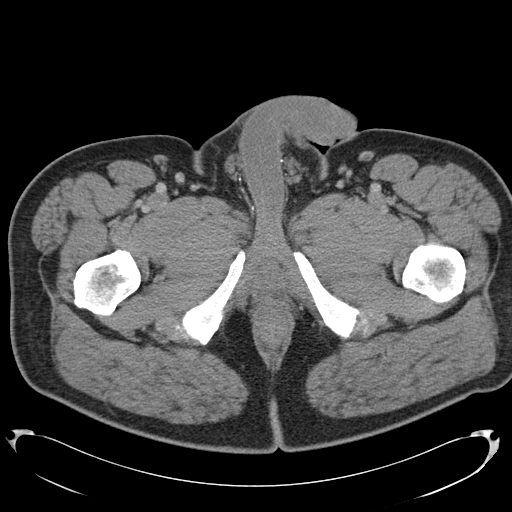
[im 6/102  bone]
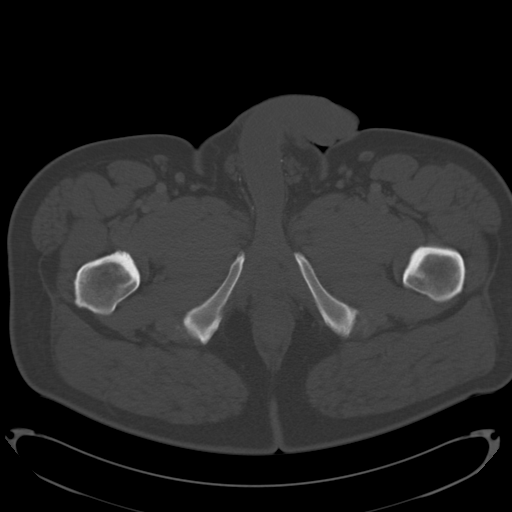
[im 11/102  soft-tissue]
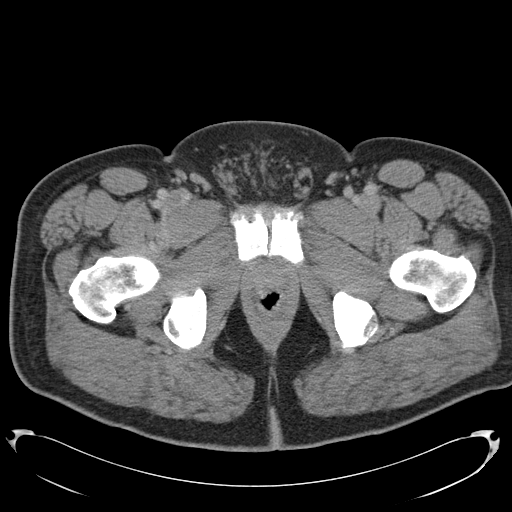
[im 22/102  soft-tissue]
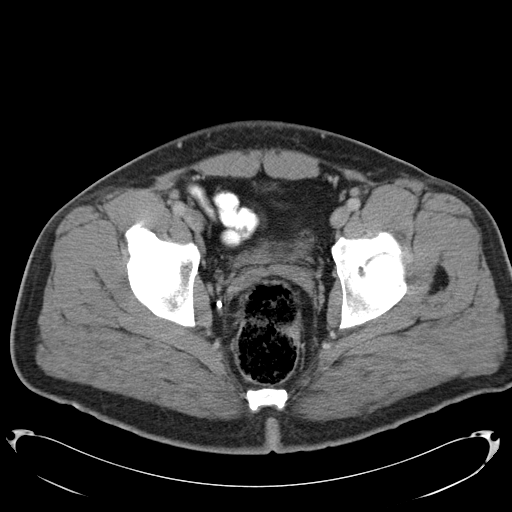
[im 27/102  soft-tissue]
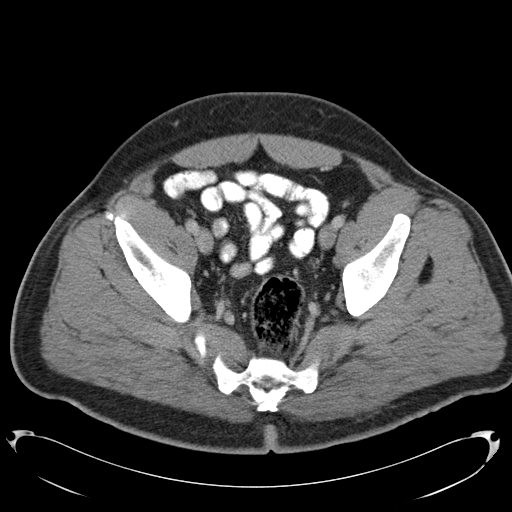
[im 32/102  soft-tissue]
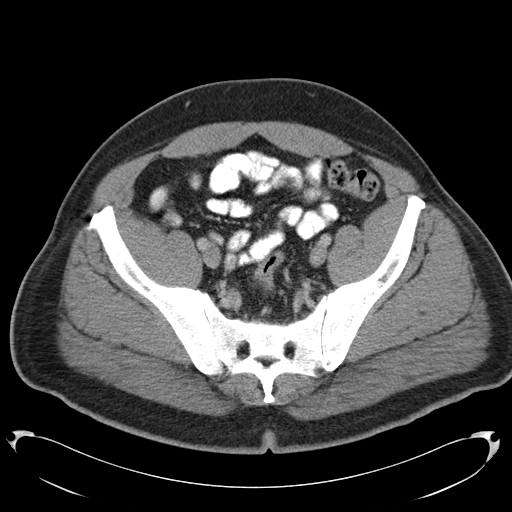
[im 43/102  soft-tissue]
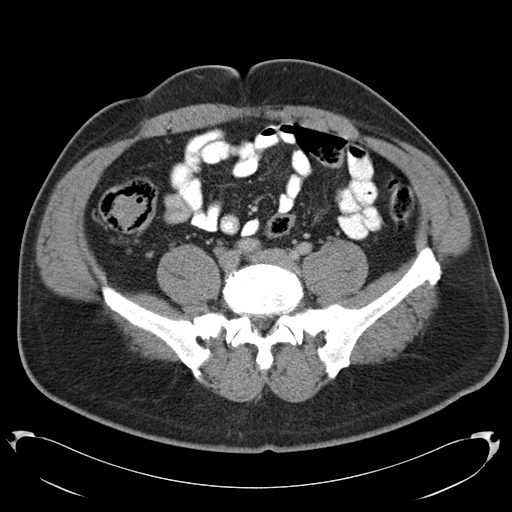
[im 48/102  soft-tissue]
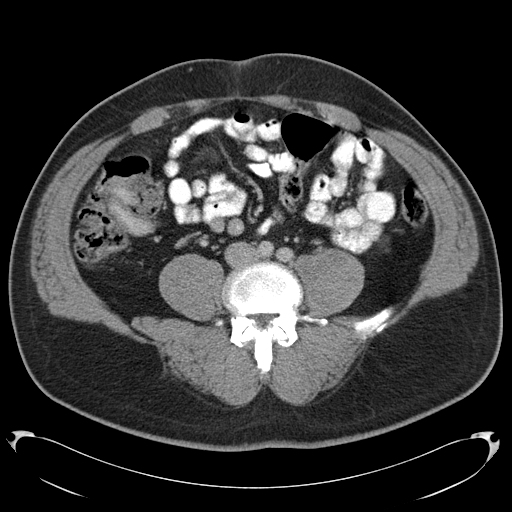
[im 54/102  soft-tissue]
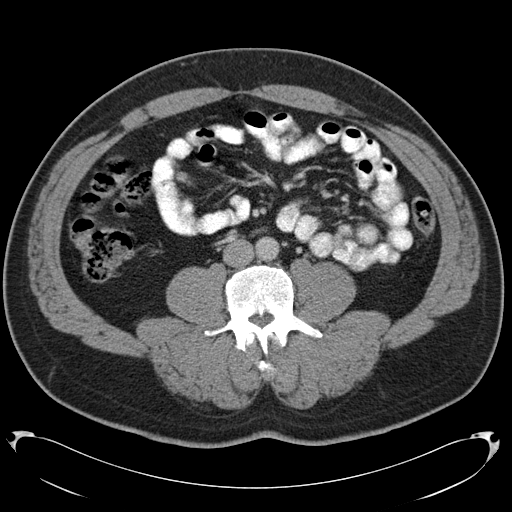
[im 59/102  soft-tissue]
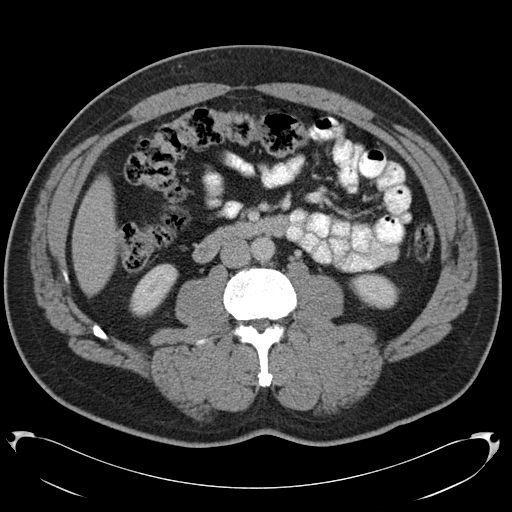
[im 59/102  bone]
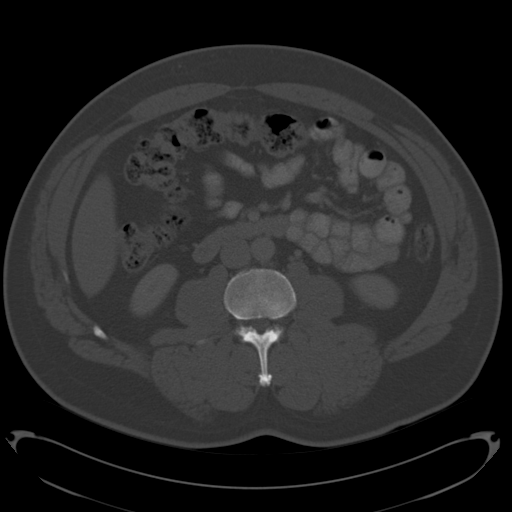
[im 70/102  soft-tissue]
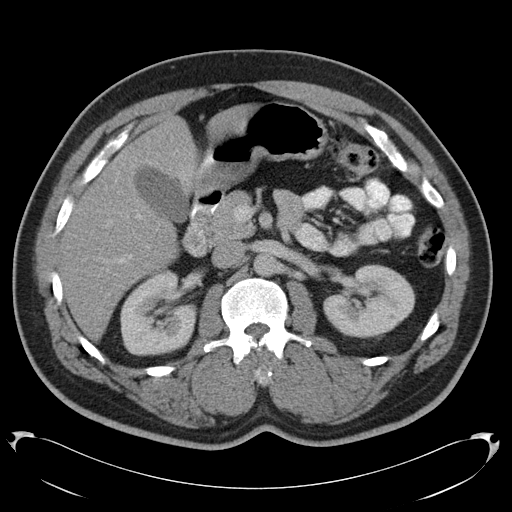
[im 75/102  soft-tissue]
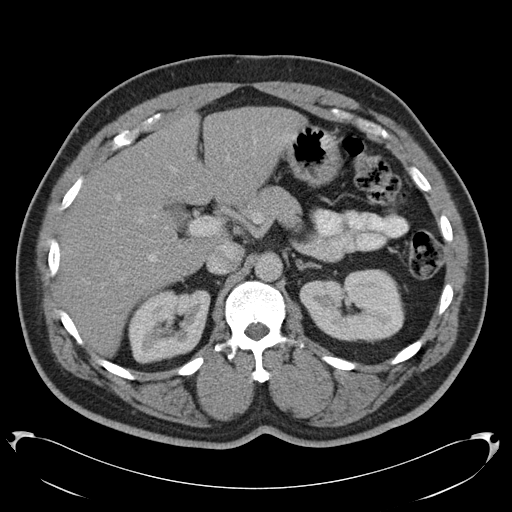
[im 80/102  soft-tissue]
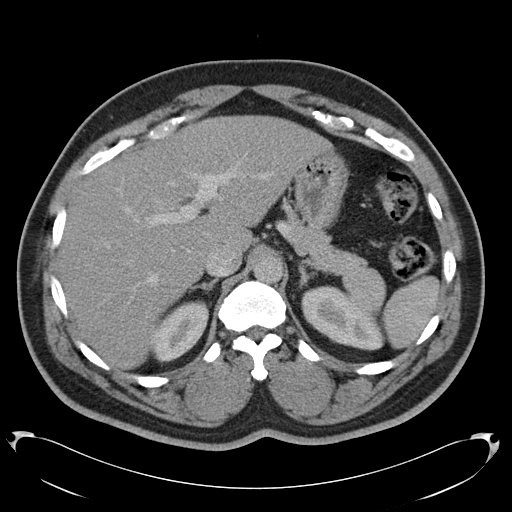
[im 91/102  soft-tissue]
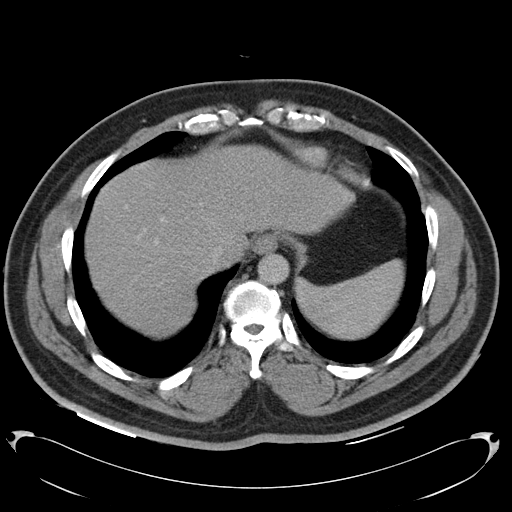
[im 96/102  soft-tissue]
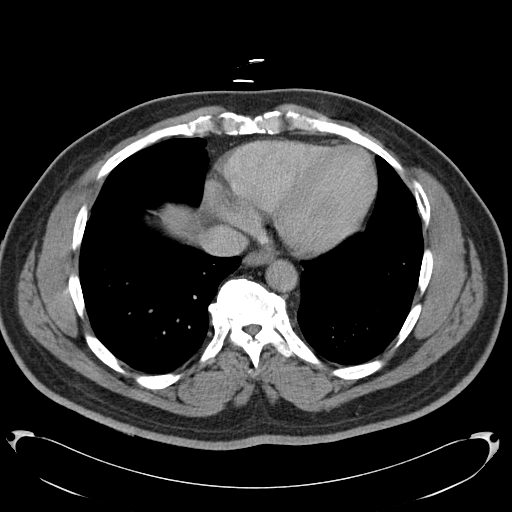

[Series 602: cor · coronal · 1.02mm/px · 3 of 165 slices shown]
[im 55/165  soft-tissue]
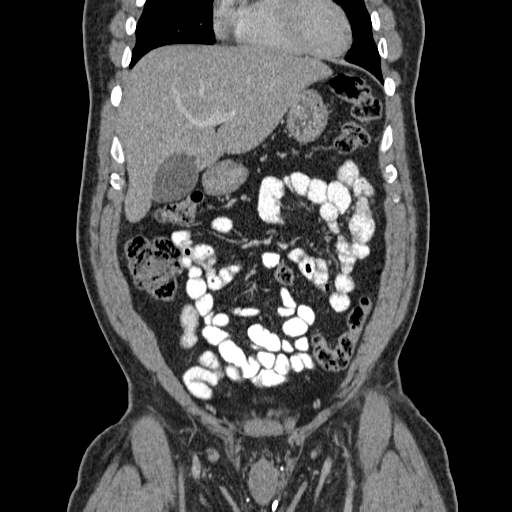
[im 73/165  soft-tissue]
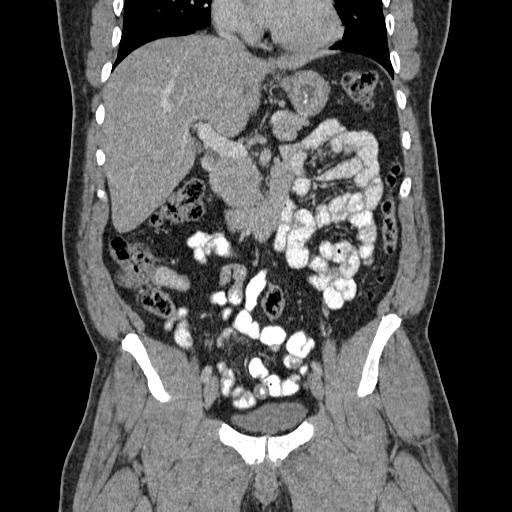
[im 92/165  soft-tissue]
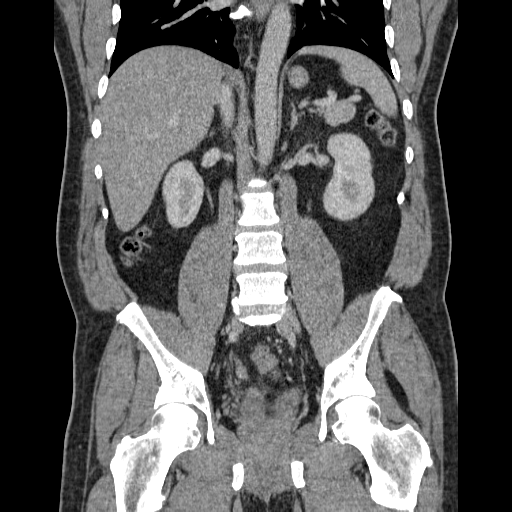

[17 of 46 positions shown; findings below may reference images not displayed]

FINDINGS: Lower chest:  Lung bases are clear.

Hepatobiliary: Liver and gallbladder appear unremarkable.

Pancreas: Normal

Spleen: Normal

Adrenals/Urinary Tract: Sub cm too small to characterize bilateral
renal cortical hypodense lesions are statistically most likely
cysts, unchanged. No hydroureteronephrosis. No radiopaque renal or
ureteral calculus. Adrenal glands are unremarkable.

Stomach/Bowel: No bowel wall thickening or focal segmental
dilatation is identified. Appendix not visualized. Moderate stool
burden. This could obscure an intraluminal mass. A few small right
lower quadrant pericecal lymph nodes measuring less than 5 mm in
short axis diameter image 61 are stable.

Vascular/Lymphatic: No aortic aneurysm.  No lymphadenopathy.

Reproductive: Prostate appears grossly normal.

Other: No free air or fluid.

Musculoskeletal: No acute osseous abnormality.
IMPRESSION: No acute intra-abdominal or pelvic pathology. The appendix is not
identified, compatible with the provided history of appendectomy in
0438. Moderate stool content within the otherwise normal-appearing
colon could obscure an intraluminal mass. Per review of the
colonoscopy report, a possible mass was seen near the appendiceal
orifice, this mass may not be apparent at CT especially after
biopsy, but no imaging correlate is identified to the finding at
colonoscopy.

## 2016-01-24 ENCOUNTER — Other Ambulatory Visit: Payer: Self-pay | Admitting: Internal Medicine

## 2016-03-31 ENCOUNTER — Ambulatory Visit (INDEPENDENT_AMBULATORY_CARE_PROVIDER_SITE_OTHER): Payer: 59 | Admitting: Internal Medicine

## 2016-03-31 ENCOUNTER — Encounter: Payer: Self-pay | Admitting: Internal Medicine

## 2016-03-31 VITALS — BP 136/74 | HR 89 | Temp 98.2°F | Ht 74.0 in | Wt 287.0 lb

## 2016-03-31 DIAGNOSIS — E119 Type 2 diabetes mellitus without complications: Secondary | ICD-10-CM

## 2016-03-31 NOTE — Progress Notes (Signed)
Subjective:    Patient ID: Douglas Reeves, male    DOB: [birth year], 55 y.o.   MRN: [id number]  HPI  55 year old patient who is seen today for follow-up of type 2 diabetes  Lab Results  Component Value Date   HGBA1C 8.3 (H) 01/02/2016   Last hemoglobin A1c was elevated and Farxiga added to his regimen.  He has noted greatly improved glycemic control and states blood sugars are occasionally in the 70-90 range  Wt Readings from Last 3 Encounters:  03/31/16 287 lb (130.2 kg)  01/02/16 291 lb (132 kg)  10/04/15 280 lb (127 kg)    Past Medical History:  Diagnosis Date  . Diabetes mellitus      Social History   Social History  . Marital status: Married    Spouse name: N/A  . Number of children: N/A  . Years of education: N/A   Occupational History  . Not on file.   Social History Main Topics  . Smoking status: Never Smoker  . Smokeless tobacco: Never Used  . Alcohol use No  . Drug use: No  . Sexual activity: Not on file   Other Topics Concern  . Not on file   Social History Narrative  . No narrative on file    Past Surgical History:  Procedure Laterality Date  . APPENDECTOMY  1972  . [place]  . VASECTOMY  2000    Family History  Problem Relation Age of Onset  . Colon cancer Neg Hx     No Known Allergies  Current Outpatient Prescriptions on File Prior to Visit  Medication Sig Dispense Refill  . Dulaglutide (TRULICITY) 1.5 0000000 SOPN Inject 1.5 mg into the skin once a week. 3 pen 6  . empagliflozin (JARDIANCE) 25 MG TABS tablet Take 25 mg by mouth daily. 30 tablet 5  . glimepiride (AMARYL) 4 MG tablet Take 1 tablet (4 mg total) by mouth daily with breakfast. 90 tablet 3  . metFORMIN (GLUCOPHAGE) 1000 MG tablet Take 1 tablet (1,000 mg total) by mouth 2 (two) times daily with a meal. 180 tablet 1  . pioglitazone (ACTOS) 45 MG tablet Take 1 tablet (45 mg total) by mouth daily. Reported on 10/04/2015 90 tablet 4   No current  facility-administered medications on file prior to visit.     BP 136/74 (BP Location: Right Arm, Patient Position: Sitting, Cuff Size: Large)   Pulse 89   Temp 98.2 F (36.8 C) (Oral)   Ht 6\' 2"  (1.88 m)   Wt 287 lb (130.2 kg)   SpO2 95%   BMI 36.85 kg/m      Review of Systems  Constitutional: Negative for appetite change, chills, fatigue and fever.  HENT: Negative for congestion, dental problem, ear pain, hearing loss, sore throat, tinnitus, trouble swallowing and voice change.   Eyes: Negative for pain, discharge and visual disturbance.  Respiratory: Negative for cough, chest tightness, wheezing and stridor.   Cardiovascular: Negative for chest pain, palpitations and leg swelling.  Gastrointestinal: Negative for abdominal distention, abdominal pain, blood in stool, constipation, diarrhea, nausea and vomiting.  Genitourinary: Negative for difficulty urinating, discharge, flank pain, genital sores, hematuria and urgency.  Musculoskeletal: Negative for arthralgias, back pain, gait problem, joint swelling, myalgias and neck stiffness.  Skin: Negative for rash.  Neurological: Negative for dizziness, syncope, speech difficulty, weakness, numbness and headaches.  Hematological: Negative for adenopathy. Does not bruise/bleed easily.  Psychiatric/Behavioral: Negative for behavioral problems and dysphoric mood. The patient is not  nervous/anxious.        Objective:   Physical Exam  Constitutional: He appears well-developed and well-nourished. No distress.  Blood pressure well controlled  Cardiovascular: Normal rate.   Pulmonary/Chest: Effort normal and breath sounds normal.  Musculoskeletal: He exhibits no edema.          Assessment & Plan:   Diabetes mellitus type 2.Glycemic control appears to be improved.  Will review hemoglobin A1c per weight loss and exercise and lifestyle issues discussed.  Follow-up 3 months  Nyoka Cowden

## 2016-03-31 NOTE — Patient Instructions (Signed)
Limit your sodium (Salt) intake   Please check your hemoglobin A1c every 3 months    It is important that you exercise regularly, at least 20 minutes 3 to 4 times per week.  If you develop chest pain or shortness of breath seek  medical attention.  You need to lose weight.  Consider a lower calorie diet and regular exercise. 

## 2016-03-31 NOTE — Progress Notes (Signed)
Pre visit review using our clinic review tool, if applicable. No additional management support is needed unless otherwise documented below in the visit note. 

## 2016-04-01 LAB — HEMOGLOBIN A1C: Hgb A1c MFr Bld: 7.2 % — ABNORMAL HIGH (ref 4.6–6.5)

## 2016-05-24 ENCOUNTER — Other Ambulatory Visit: Payer: Self-pay | Admitting: Internal Medicine

## 2016-07-10 ENCOUNTER — Ambulatory Visit: Payer: 59 | Admitting: Internal Medicine

## 2016-07-19 ENCOUNTER — Other Ambulatory Visit: Payer: Self-pay | Admitting: Internal Medicine

## 2016-07-24 ENCOUNTER — Ambulatory Visit: Payer: Self-pay | Admitting: Internal Medicine

## 2016-07-31 ENCOUNTER — Ambulatory Visit (INDEPENDENT_AMBULATORY_CARE_PROVIDER_SITE_OTHER): Payer: 59 | Admitting: Internal Medicine

## 2016-07-31 ENCOUNTER — Encounter: Payer: Self-pay | Admitting: Internal Medicine

## 2016-07-31 VITALS — BP 126/70 | HR 84 | Temp 98.1°F | Ht 74.0 in | Wt 285.0 lb

## 2016-07-31 DIAGNOSIS — E119 Type 2 diabetes mellitus without complications: Secondary | ICD-10-CM | POA: Diagnosis not present

## 2016-07-31 NOTE — Progress Notes (Signed)
Subjective:    Patient ID: Douglas Reeves, male    DOB: 1960-11-22, 56 y.o   MRN: [id number]  HPI  56 year old patient who is seen today for follow-up of type 2 diabetes.  He has exogenous obesity.  He is on maximal oral medications and hemoglobin A1c's have trended down  Lab Results  Component Value Date   HGBA1C 7.2 (H) 03/31/2016    No weight loss Patient has noticed some blood sugars in the eighties and nineties General he feels well  Wt Readings from Last 3 Encounters:  07/31/16 285 lb (129.3 kg)  03/31/16 287 lb (130.2 kg)  01/02/16 291 lb (132 kg)    Past Medical History:  Diagnosis Date  . Diabetes mellitus      Social History   Social History  . Marital status: Married    Spouse name: N/A  . Number of children: N/A  . Years of education: N/A   Occupational History  . Not on file.   Social History Main Topics  . Smoking status: Never Smoker  . Smokeless tobacco: Never Used  . Alcohol use No  . Drug use: No  . Sexual activity: Not on file   Other Topics Concern  . Not on file   Social History Narrative  . No narrative on file    Past Surgical History:  Procedure Laterality Date  . APPENDECTOMY  1972  . [place]  . VASECTOMY  2000    Family History  Problem Relation Age of Onset  . Colon cancer Neg Hx     No Known Allergies  Current Outpatient Prescriptions on File Prior to Visit  Medication Sig Dispense Refill  . glimepiride (AMARYL) 4 MG tablet Take 1 tablet (4 mg total) by mouth daily with breakfast. 90 tablet 3  . JARDIANCE 25 MG TABS tablet TAKE ONE TABLET BY MOUTH ONCE DAILY 30 tablet 5  . metFORMIN (GLUCOPHAGE) 1000 MG tablet Take 1 tablet (1,000 mg total) by mouth 2 (two) times daily with a meal. 180 tablet 1  . pioglitazone (ACTOS) 45 MG tablet Take 1 tablet (45 mg total) by mouth daily. Reported on 10/04/2015 90 tablet 4  . TRULICITY 1.5 DZ/3.2DJ SOPN INJECT 1.5 MG  SUBCUTANEOUSLY ONCE A WEEK 4 pen 6   No current  facility-administered medications on file prior to visit.     BP 126/70 (BP Location: Left Arm, Patient Position: Sitting, Cuff Size: Large)   Pulse 84   Temp 98.1 F (36.7 C) (Oral)   Ht 6\' 2"  (1.88 m)   Wt 285 lb (129.3 kg)   SpO2 99%   BMI 36.59 kg/m     Review of Systems  Constitutional: Negative for appetite change, chills, fatigue and fever.  HENT: Negative for congestion, dental problem, ear pain, hearing loss, sore throat, tinnitus, trouble swallowing and voice change.   Eyes: Negative for pain, discharge and visual disturbance.  Respiratory: Negative for cough, chest tightness, wheezing and stridor.   Cardiovascular: Negative for chest pain, palpitations and leg swelling.  Gastrointestinal: Negative for abdominal distention, abdominal pain, blood in stool, constipation, diarrhea, nausea and vomiting.  Genitourinary: Negative for difficulty urinating, discharge, flank pain, genital sores, hematuria and urgency.  Musculoskeletal: Negative for arthralgias, back pain, gait problem, joint swelling, myalgias and neck stiffness.  Skin: Negative for rash.  Neurological: Negative for dizziness, syncope, speech difficulty, weakness, numbness and headaches.  Hematological: Negative for adenopathy. Does not bruise/bleed easily.  Psychiatric/Behavioral: Negative for behavioral problems and dysphoric  mood. The patient is not nervous/anxious.        Objective:   Physical Exam  Constitutional: He appears well-developed. No distress.  Eyes:  Fundi appeared normal  Cardiovascular: Normal rate and regular rhythm.   Pulmonary/Chest: Effort normal and breath sounds normal.  Musculoskeletal: He exhibits no edema.          Assessment & Plan:   Diabetes mellitus.  Will check hemoglobin A1c, hopefully will be at goal weight loss encouraged Annual eye examination encouraged  Follow-up 3 months  Yarden Hillis Pilar Plate

## 2016-07-31 NOTE — Progress Notes (Signed)
Pre visit review using our clinic review tool, if applicable. No additional management support is needed unless otherwise documented below in the visit note. 

## 2016-07-31 NOTE — Patient Instructions (Signed)
Limit your sodium (Salt) intake   Please check your hemoglobin A1c every 3 months    It is important that you exercise regularly, at least 20 minutes 3 to 4 times per week.  If you develop chest pain or shortness of breath seek  medical attention.  You need to lose weight.  Consider a lower calorie diet and regular exercise. 

## 2016-08-01 LAB — HEMOGLOBIN A1C
HEMOGLOBIN A1C: 6.6 % — AB (ref <5.7)
MEAN PLASMA GLUCOSE: 143 mg/dL

## 2016-09-23 ENCOUNTER — Other Ambulatory Visit: Payer: Self-pay | Admitting: Internal Medicine

## 2016-10-23 ENCOUNTER — Other Ambulatory Visit: Payer: Self-pay | Admitting: Internal Medicine

## 2016-10-30 ENCOUNTER — Ambulatory Visit: Payer: 59 | Admitting: Internal Medicine

## 2016-12-11 ENCOUNTER — Encounter: Payer: Self-pay | Admitting: Internal Medicine

## 2016-12-11 ENCOUNTER — Ambulatory Visit (INDEPENDENT_AMBULATORY_CARE_PROVIDER_SITE_OTHER): Payer: 59 | Admitting: Internal Medicine

## 2016-12-11 VITALS — BP 138/64 | HR 81 | Temp 98.6°F | Ht 74.0 in | Wt 276.8 lb

## 2016-12-11 DIAGNOSIS — Z6841 Body Mass Index (BMI) 40.0 and over, adult: Secondary | ICD-10-CM

## 2016-12-11 DIAGNOSIS — E119 Type 2 diabetes mellitus without complications: Secondary | ICD-10-CM

## 2016-12-11 LAB — POCT GLYCOSYLATED HEMOGLOBIN (HGB A1C): Hemoglobin A1C: 7

## 2016-12-11 NOTE — Progress Notes (Signed)
Subjective:    Patient ID: Douglas Reeves, male    DOB: Jan 31, 1961, 56 y.o.   MRN: [id number]  HPI  56 year old patient seen today for follow-up of type 2 diabetes.  He has exogenous obesity.  He is doing quite well today  Lab Results  Component Value Date   HGBA1C 7.0 12/11/2016    He continues to lose weight.  Modestly.  No concerns or complaints  Past Medical History:  Diagnosis Date  . Diabetes mellitus      Social History   Social History  . Marital status: Married    Spouse name: N/A  . Number of children: N/A  . Years of education: N/A   Occupational History  . Not on file.   Social History Main Topics  . Smoking status: Never Smoker  . Smokeless tobacco: Never Used  . Alcohol use No  . Drug use: No  . Sexual activity: Not on file   Other Topics Concern  . Not on file   Social History Narrative  . No narrative on file    Past Surgical History:  Procedure Laterality Date  . APPENDECTOMY  1972  . [place]  . VASECTOMY  2000    Family History  Problem Relation Age of Onset  . Colon cancer Neg Hx     No Known Allergies  Current Outpatient Prescriptions on File Prior to Visit  Medication Sig Dispense Refill  . glimepiride (AMARYL) 4 MG tablet TAKE ONE TABLET BY MOUTH ONCE DAILY WITH BREAKFAST 30 tablet 11  . JARDIANCE 25 MG TABS tablet TAKE ONE TABLET BY MOUTH ONCE DAILY 30 tablet 5  . metFORMIN (GLUCOPHAGE) 1000 MG tablet TAKE ONE TABLET BY MOUTH TWICE DAILY WITH A MEAL 180 tablet 1  . pioglitazone (ACTOS) 45 MG tablet Take 1 tablet (45 mg total) by mouth daily. Reported on 10/04/2015 90 tablet 4  . TRULICITY 1.5 HW/2.9HB SOPN INJECT 1.5 MG  SUBCUTANEOUSLY ONCE A WEEK 4 pen 6   No current facility-administered medications on file prior to visit.     BP 138/64 (BP Location: Left Arm, Patient Position: Sitting, Cuff Size: Normal)   Pulse 81   Temp 98.6 F (37 C) (Oral)   Ht 6\' 2"  (1.88 m)   Wt 276 lb 12.8 oz (125.6 kg)   SpO2 97%    BMI 35.54 kg/m     Review of Systems  Constitutional: Negative for appetite change, chills, fatigue and fever.  HENT: Negative for congestion, dental problem, ear pain, hearing loss, sore throat, tinnitus, trouble swallowing and voice change.   Eyes: Negative for pain, discharge and visual disturbance.  Respiratory: Negative for cough, chest tightness, wheezing and stridor.   Cardiovascular: Negative for chest pain, palpitations and leg swelling.  Gastrointestinal: Negative for abdominal distention, abdominal pain, blood in stool, constipation, diarrhea, nausea and vomiting.  Genitourinary: Negative for difficulty urinating, discharge, flank pain, genital sores, hematuria and urgency.  Musculoskeletal: Negative for arthralgias, back pain, gait problem, joint swelling, myalgias and neck stiffness.  Skin: Negative for rash.  Neurological: Negative for dizziness, syncope, speech difficulty, weakness, numbness and headaches.  Hematological: Negative for adenopathy. Does not bruise/bleed easily.  Psychiatric/Behavioral: Negative for behavioral problems and dysphoric mood. The patient is not nervous/anxious.        Objective:   Physical Exam  Constitutional: He is oriented to person, place, and time. He appears well-developed.  Blood pressure 120/70, weight 276  HENT:  Head: Normocephalic.  Right Ear: External ear  normal.  Left Ear: External ear normal.  Eyes: Conjunctivae and EOM are normal.  Neck: Normal range of motion.  Cardiovascular: Normal rate and normal heart sounds.   Pulmonary/Chest: Breath sounds normal.  Abdominal: Bowel sounds are normal.  Musculoskeletal: Normal range of motion. He exhibits no edema or tenderness.  Neurological: He is alert and oriented to person, place, and time.  Psychiatric: He has a normal mood and affect. His behavior is normal.          Assessment & Plan:   Diabetes mellitus.  Hemoglobin A1c 7.0.  No change in therapy Obesity.  Modest  weight loss since last visit.  We'll continue efforts at weight loss  Return in 4 months for follow-up  Nyoka Cowden

## 2016-12-11 NOTE — Patient Instructions (Signed)
Limit your sodium (Salt) intake  You need to lose weight.  Consider a lower calorie diet and regular exercise.    It is important that you exercise regularly, at least 20 minutes 3 to 4 times per week.  If you develop chest pain or shortness of breath seek  medical attention.  Return in 4 months for follow-up  

## 2016-12-18 ENCOUNTER — Other Ambulatory Visit: Payer: Self-pay | Admitting: Internal Medicine

## 2016-12-27 ENCOUNTER — Other Ambulatory Visit: Payer: Self-pay | Admitting: Internal Medicine

## 2016-12-31 ENCOUNTER — Encounter: Payer: Self-pay | Admitting: Internal Medicine

## 2017-01-30 ENCOUNTER — Other Ambulatory Visit: Payer: Self-pay | Admitting: Internal Medicine

## 2017-03-24 DIAGNOSIS — S91109A Unspecified open wound of unspecified toe(s) without damage to nail, initial encounter: Secondary | ICD-10-CM | POA: Diagnosis not present

## 2017-03-24 DIAGNOSIS — M79674 Pain in right toe(s): Secondary | ICD-10-CM | POA: Diagnosis not present

## 2017-03-27 ENCOUNTER — Other Ambulatory Visit: Payer: Self-pay | Admitting: Internal Medicine

## 2017-04-22 ENCOUNTER — Ambulatory Visit: Payer: 59 | Admitting: Internal Medicine

## 2017-04-22 ENCOUNTER — Encounter: Payer: Self-pay | Admitting: Internal Medicine

## 2017-04-22 VITALS — BP 110/60 | HR 82 | Temp 98.9°F | Ht 74.0 in | Wt 289.6 lb

## 2017-04-22 DIAGNOSIS — E119 Type 2 diabetes mellitus without complications: Secondary | ICD-10-CM

## 2017-04-22 DIAGNOSIS — Z0000 Encounter for general adult medical examination without abnormal findings: Secondary | ICD-10-CM | POA: Diagnosis not present

## 2017-04-22 LAB — POCT GLYCOSYLATED HEMOGLOBIN (HGB A1C): Hemoglobin A1C: 6.8

## 2017-04-22 MED ORDER — METFORMIN HCL 1000 MG PO TABS: ORAL_TABLET | ORAL | 4 refills | Status: DC

## 2017-04-22 MED ORDER — GLIMEPIRIDE 4 MG PO TABS: ORAL_TABLET | ORAL | 11 refills | Status: DC

## 2017-04-22 MED ORDER — PIOGLITAZONE HCL 45 MG PO TABS: 45.0000 mg | ORAL_TABLET | Freq: Every day | ORAL | 10 refills | Status: DC

## 2017-04-22 MED ORDER — EMPAGLIFLOZIN 25 MG PO TABS: 25.0000 mg | ORAL_TABLET | Freq: Every day | ORAL | 5 refills | Status: DC

## 2017-04-22 MED ORDER — DULAGLUTIDE 1.5 MG/0.5ML ~~LOC~~ SOAJ: SUBCUTANEOUS | 6 refills | Status: DC

## 2017-04-22 NOTE — Progress Notes (Signed)
Subjective:    Patient ID: Douglas Reeves, male    DOB: 1960-06-30, 57 y.o.   MRN: [id number]  HPI  Lab Results  Component Value Date   HGBA1C 7.0 12/11/2016   BP Readings from Last 3 Encounters:  04/22/17 110/60  12/11/16 138/64  07/31/16 126/70    Wt Readings from Last 3 Encounters:  04/22/17 289 lb 9.6 oz (131.4 kg)  12/11/16 276 lb 12.8 oz (125.6 kg)  07/31/16 285 lb (41.67 kg)   57 year old patient who has a history of type 2 diabetes as well as exogenous obesity.  He has had some weight gain over the holidays  Hemoglobin A1c today 6.8.  No recent eye examination  Past Medical History:  Diagnosis Date  . Diabetes mellitus      Social History   Socioeconomic History  . Marital status: Married    Spouse name: Not on file  . Number of children: Not on file  . Years of education: Not on file  . Highest education level: Not on file  Social Needs  . Financial resource strain: Not on file  . Food insecurity - worry: Not on file  . Food insecurity - inability: Not on file  . Transportation needs - medical: Not on file  . Transportation needs - non-medical: Not on file  Occupational History  . Not on file  Tobacco Use  . Smoking status: Never Smoker  . Smokeless tobacco: Never Used  Substance and Sexual Activity  . Alcohol use: No    Alcohol/week: 0.0 oz  . Drug use: No  . Sexual activity: Not on file  Other Topics Concern  . Not on file  Social History Narrative  . Not on file    Past Surgical History:  Procedure Laterality Date  . APPENDECTOMY  1972  . [place]  . VASECTOMY  2000    Family History  Problem Relation Age of Onset  . Colon cancer Neg Hx     No Known Allergies  No current outpatient medications on file prior to visit.   No current facility-administered medications on file prior to visit.     BP 110/60 (BP Location: Left Arm, Patient Position: Sitting)   Pulse 82   Temp 98.9 F (37.2 C) (Oral)   Ht 6\' 2"  (1.88 m)    Wt 289 lb 9.6 oz (131.4 kg)   SpO2 97%   BMI 37.18 kg/m       Review of Systems  Constitutional: Positive for unexpected weight change. Negative for appetite change, chills, fatigue and fever.  HENT: Negative for congestion, dental problem, ear pain, hearing loss, sore throat, tinnitus, trouble swallowing and voice change.   Eyes: Negative for pain, discharge and visual disturbance.  Respiratory: Negative for cough, chest tightness, wheezing and stridor.   Cardiovascular: Negative for chest pain, palpitations and leg swelling.  Gastrointestinal: Negative for abdominal distention, abdominal pain, blood in stool, constipation, diarrhea, nausea and vomiting.  Genitourinary: Negative for difficulty urinating, discharge, flank pain, genital sores, hematuria and urgency.  Musculoskeletal: Negative for arthralgias, back pain, gait problem, joint swelling, myalgias and neck stiffness.  Skin: Negative for rash.  Neurological: Negative for dizziness, syncope, speech difficulty, weakness, numbness and headaches.  Hematological: Negative for adenopathy. Does not bruise/bleed easily.  Psychiatric/Behavioral: Negative for behavioral problems and dysphoric mood. The patient is not nervous/anxious.        Objective:   Physical Exam  Constitutional: He is oriented to person, place, and time. He appears  well-developed.  Blood pressure well controlled Weight 289  HENT:  Head: Normocephalic.  Right Ear: External ear normal.  Left Ear: External ear normal.  Eyes: Conjunctivae and EOM are normal.  Neck: Normal range of motion.  Cardiovascular: Normal rate and normal heart sounds.  Pulmonary/Chest: Breath sounds normal.  Abdominal: Bowel sounds are normal.  Musculoskeletal: Normal range of motion. He exhibits no edema or tenderness.  Neurological: He is alert and oriented to person, place, and time.  Psychiatric: He has a normal mood and affect. His behavior is normal.          Assessment  & Plan:   Diabetes mellitus type 2.  Hemoglobin A1c at goal Worsening exogenous obesity.  Weight loss encouraged.  Follow-up 4 months  Check updated lab including lipid profile hepatitis C antibody and urine for microalbumin  Nyoka Cowden

## 2017-04-22 NOTE — Patient Instructions (Signed)
Limit your sodium (Salt) intake   Please check your hemoglobin A1c every 3 months    It is important that you exercise regularly, at least 20 minutes 3 to 4 times per week.  If you develop chest pain or shortness of breath seek  medical attention.  You need to lose weight.  Consider a lower calorie diet and regular exercise.  Please see your eye doctor yearly to check for diabetic eye damage   

## 2017-04-23 LAB — CBC WITH DIFFERENTIAL/PLATELET
BASOS ABS: 0.1 10*3/uL (ref 0.0–0.1)
Basophils Relative: 2 % (ref 0.0–3.0)
EOS ABS: 0.1 10*3/uL (ref 0.0–0.7)
Eosinophils Relative: 2 % (ref 0.0–5.0)
HCT: 48.1 % (ref 39.0–52.0)
Hemoglobin: 15.6 g/dL (ref 13.0–17.0)
LYMPHS ABS: 2.1 10*3/uL (ref 0.7–4.0)
Lymphocytes Relative: 34.6 % (ref 12.0–46.0)
MCHC: 32.5 g/dL (ref 30.0–36.0)
MCV: 92.5 fl (ref 78.0–100.0)
Monocytes Absolute: 0.7 10*3/uL (ref 0.1–1.0)
Monocytes Relative: 11.4 % (ref 3.0–12.0)
NEUTROS PCT: 50 % (ref 43.0–77.0)
Neutro Abs: 3 10*3/uL (ref 1.4–7.7)
PLATELETS: 295 10*3/uL (ref 150.0–400.0)
RBC: 5.2 Mil/uL (ref 4.22–5.81)
RDW: 15.1 % (ref 11.5–15.5)
WBC: 6.1 10*3/uL (ref 4.0–10.5)

## 2017-04-23 LAB — TSH: TSH: 2 u[IU]/mL (ref 0.35–4.50)

## 2017-04-23 LAB — LIPID PANEL
Cholesterol: 159 mg/dL (ref 0–200)
HDL: 44.8 mg/dL (ref >39.00)
LDL Cholesterol: 86 mg/dL (ref 0–99)
NONHDL: 113.93
TRIGLYCERIDES: 140 mg/dL (ref 0.0–149.0)
Total CHOL/HDL Ratio: 4
VLDL: 28 mg/dL (ref 0.0–40.0)

## 2017-04-23 LAB — COMPREHENSIVE METABOLIC PANEL
ALT: 15 U/L (ref 0–53)
AST: 12 U/L (ref 0–37)
Albumin: 4.4 g/dL (ref 3.5–5.2)
Alkaline Phosphatase: 55 U/L (ref 39–117)
BILIRUBIN TOTAL: 0.7 mg/dL (ref 0.2–1.2)
BUN: 9 mg/dL (ref 6–23)
CALCIUM: 9.4 mg/dL (ref 8.4–10.5)
CO2: 30 mEq/L (ref 19–32)
CREATININE: 1.01 mg/dL (ref 0.40–1.50)
Chloride: 99 mEq/L (ref 96–112)
GFR: 98.29 mL/min (ref >60.00)
GLUCOSE: 94 mg/dL (ref 70–99)
Potassium: 5.1 mEq/L (ref 3.5–5.1)
Sodium: 137 mEq/L (ref 135–145)
Total Protein: 7.3 g/dL (ref 6.0–8.3)

## 2017-04-23 LAB — MICROALBUMIN / CREATININE URINE RATIO
CREATININE, U: 87.1 mg/dL
MICROALB/CREAT RATIO: 0.8 mg/g (ref 0.0–30.0)

## 2017-08-23 ENCOUNTER — Ambulatory Visit: Payer: 59 | Admitting: Internal Medicine

## 2017-08-31 ENCOUNTER — Encounter: Payer: Self-pay | Admitting: Internal Medicine

## 2017-08-31 ENCOUNTER — Ambulatory Visit: Payer: 59 | Admitting: Internal Medicine

## 2017-08-31 VITALS — BP 138/60 | HR 85 | Temp 98.4°F | Wt 282.0 lb

## 2017-08-31 DIAGNOSIS — E119 Type 2 diabetes mellitus without complications: Secondary | ICD-10-CM

## 2017-08-31 LAB — POCT GLYCOSYLATED HEMOGLOBIN (HGB A1C): HEMOGLOBIN A1C: 7.1 % — AB (ref 4.0–5.6)

## 2017-08-31 MED ORDER — GLIMEPIRIDE 4 MG PO TABS: ORAL_TABLET | ORAL | 11 refills | Status: DC

## 2017-08-31 MED ORDER — PIOGLITAZONE HCL 45 MG PO TABS: 45.0000 mg | ORAL_TABLET | Freq: Every day | ORAL | 10 refills | Status: DC

## 2017-08-31 MED ORDER — DULAGLUTIDE 1.5 MG/0.5ML ~~LOC~~ SOAJ: SUBCUTANEOUS | 6 refills | Status: DC

## 2017-08-31 MED ORDER — EMPAGLIFLOZIN 25 MG PO TABS: 25.0000 mg | ORAL_TABLET | Freq: Every day | ORAL | 5 refills | Status: DC

## 2017-08-31 MED ORDER — METFORMIN HCL 1000 MG PO TABS: ORAL_TABLET | ORAL | 4 refills | Status: DC

## 2017-08-31 NOTE — Progress Notes (Signed)
Subjective:    Patient ID: Douglas Reeves, male    DOB: 1960/07/04, 57 y.o.   MRN: [id number]  HPI  57 year old patient who is seen today for follow-up of type 2 diabetes. His lasting globin A1c was 6.8.  He continues to do well.  His weight is down 7 pounds over the past 4 months  Lab Results  Component Value Date   HGBA1C 7.1 (A) 08/31/2017    Past Medical History:  Diagnosis Date  . Diabetes mellitus      Social History   Socioeconomic History  . Marital status: Married    Spouse name: Not on file  . Number of children: Not on file  . Years of education: Not on file  . Highest education level: Not on file  Occupational History  . Not on file  Social Needs  . Financial resource strain: Not on file  . Food insecurity:    Worry: Not on file    Inability: Not on file  . Transportation needs:    Medical: Not on file    Non-medical: Not on file  Tobacco Use  . Smoking status: Never Smoker  . Smokeless tobacco: Never Used  Substance and Sexual Activity  . Alcohol use: No    Alcohol/week: 0.0 oz  . Drug use: No  . Sexual activity: Not on file  Lifestyle  . Physical activity:    Days per week: Not on file    Minutes per session: Not on file  . Stress: Not on file  Relationships  . Social connections:    Talks on phone: Not on file    Gets together: Not on file    Attends religious service: Not on file    Active member of club or organization: Not on file    Attends meetings of clubs or organizations: Not on file    Relationship status: Not on file  . Intimate partner violence:    Fear of current or ex partner: Not on file    Emotionally abused: Not on file    Physically abused: Not on file    Forced sexual activity: Not on file  Other Topics Concern  . Not on file  Social History Narrative  . Not on file    Past Surgical History:  Procedure Laterality Date  . APPENDECTOMY  1972  . [place]  . VASECTOMY  2000    Family History  Problem  Relation Age of Onset  . Colon cancer Neg Hx     No Known Allergies    BP 138/60 (BP Location: Right Arm, Patient Position: Sitting, Cuff Size: Large)   Pulse 85   Temp 98.4 F (36.9 C) (Oral)   Wt 282 lb (127.9 kg)   SpO2 96%   BMI 36.21 kg/m     Review of Systems  Constitutional: Negative for appetite change, chills, fatigue and fever.  HENT: Negative for congestion, dental problem, ear pain, hearing loss, sore throat, tinnitus, trouble swallowing and voice change.   Eyes: Negative for pain, discharge and visual disturbance.  Respiratory: Negative for cough, chest tightness, wheezing and stridor.   Cardiovascular: Negative for chest pain, palpitations and leg swelling.  Gastrointestinal: Negative for abdominal distention, abdominal pain, blood in stool, constipation, diarrhea, nausea and vomiting.  Genitourinary: Negative for difficulty urinating, discharge, flank pain, genital sores, hematuria and urgency.  Musculoskeletal: Negative for arthralgias, back pain, gait problem, joint swelling, myalgias and neck stiffness.  Skin: Negative for rash.  Neurological: Negative  for dizziness, syncope, speech difficulty, weakness, numbness and headaches.  Hematological: Negative for adenopathy. Does not bruise/bleed easily.  Psychiatric/Behavioral: Negative for behavioral problems and dysphoric mood. The patient is not nervous/anxious.        Objective:   Physical Exam  Constitutional: He is oriented to person, place, and time. He appears well-developed and well-nourished.  HENT:  Head: Normocephalic and atraumatic.  Right Ear: External ear normal.  Left Ear: External ear normal.  Nose: Nose normal.  Mouth/Throat: Oropharynx is clear and moist.  Eyes: Pupils are equal, round, and reactive to light. Conjunctivae and EOM are normal. No scleral icterus.  Neck: Normal range of motion. Neck supple. No JVD present. No thyromegaly present.  Cardiovascular: Normal rate, regular rhythm,  normal heart sounds and intact distal pulses. Exam reveals no gallop and no friction rub.  No murmur heard. Pulmonary/Chest: Effort normal and breath sounds normal. He exhibits no tenderness.  Abdominal: Soft. Bowel sounds are normal. He exhibits no distension and no mass. There is no tenderness.  Musculoskeletal: Normal range of motion. He exhibits no edema or tenderness.  Lymphadenopathy:    He has no cervical adenopathy.  Neurological: He is alert and oriented to person, place, and time. He has normal reflexes. No cranial nerve deficit. Coordination normal.  Skin: Skin is warm and dry. No rash noted.  Psychiatric: He has a normal mood and affect. His behavior is normal.          Assessment & Plan:  Diabetes mellitus.  Slight bump in hemoglobin A1c in spite of weight loss.  No change in medical regimen medications Updated all medications  Follow-up 4 months  Treniece Holsclaw Pilar Plate

## 2017-08-31 NOTE — Patient Instructions (Signed)
Please check your hemoglobin A1c every 3-6 months

## 2017-12-28 ENCOUNTER — Encounter: Payer: Self-pay | Admitting: Internal Medicine

## 2017-12-28 ENCOUNTER — Ambulatory Visit: Payer: 59 | Admitting: Internal Medicine

## 2017-12-28 VITALS — BP 120/80 | HR 91 | Temp 98.3°F | Wt 280.4 lb

## 2017-12-28 DIAGNOSIS — E119 Type 2 diabetes mellitus without complications: Secondary | ICD-10-CM

## 2017-12-28 DIAGNOSIS — Z23 Encounter for immunization: Secondary | ICD-10-CM

## 2017-12-28 LAB — POCT GLYCOSYLATED HEMOGLOBIN (HGB A1C): HEMOGLOBIN A1C: 7 % — AB (ref 4.0–5.6)

## 2017-12-28 MED ORDER — METFORMIN HCL 1000 MG PO TABS: ORAL_TABLET | ORAL | 4 refills | Status: DC

## 2017-12-28 MED ORDER — GLIMEPIRIDE 4 MG PO TABS: ORAL_TABLET | ORAL | 11 refills | Status: DC

## 2017-12-28 MED ORDER — PIOGLITAZONE HCL 45 MG PO TABS: 45.0000 mg | ORAL_TABLET | Freq: Every day | ORAL | 10 refills | Status: DC

## 2017-12-28 MED ORDER — DULAGLUTIDE 1.5 MG/0.5ML ~~LOC~~ SOAJ: SUBCUTANEOUS | 6 refills | Status: DC

## 2017-12-28 MED ORDER — EMPAGLIFLOZIN 25 MG PO TABS: 25.0000 mg | ORAL_TABLET | Freq: Every day | ORAL | 5 refills | Status: DC

## 2017-12-28 NOTE — Patient Instructions (Addendum)
Limit your sodium (Salt) intake   Please check your hemoglobin A1c every 3 months    It is important that you exercise regularly, at least 20 minutes 3 to 4 times per week.  If you develop chest pain or shortness of breath seek  medical attention.  You need to lose weight.  Consider a lower calorie diet and regular exercise.  Please see your eye doctor yearly to check for diabetic eye damage   

## 2017-12-28 NOTE — Progress Notes (Signed)
Subjective:    Patient ID: Douglas Reeves, male    DOB: 14-Nov-1960, 57 y.o.   MRN: [id number]  HPI  57 year old patient who is seen today for follow-up of type 2 diabetes. Doing well without concerns or complaints.  No history of symptomatic hypoglycemia.  Lab Results  Component Value Date   HGBA1C 7.0 (A) 12/28/2017    Wt Readings from Last 3 Encounters:  12/28/17 280 lb 6.4 oz (127.2 kg)  08/31/17 282 lb (127.9 kg)  04/22/17 289 lb 9.6 oz (131.4 kg)    Past Medical History:  Diagnosis Date  . Diabetes mellitus      Social History   Socioeconomic History  . Marital status: Married    Spouse name: Not on file  . Number of children: Not on file  . Years of education: Not on file  . Highest education level: Not on file  Occupational History  . Not on file  Social Needs  . Financial resource strain: Not on file  . Food insecurity:    Worry: Not on file    Inability: Not on file  . Transportation needs:    Medical: Not on file    Non-medical: Not on file  Tobacco Use  . Smoking status: Never Smoker  . Smokeless tobacco: Never Used  Substance and Sexual Activity  . Alcohol use: No    Alcohol/week: 0.0 standard drinks  . Drug use: No  . Sexual activity: Not on file  Lifestyle  . Physical activity:    Days per week: Not on file    Minutes per session: Not on file  . Stress: Not on file  Relationships  . Social connections:    Talks on phone: Not on file    Gets together: Not on file    Attends religious service: Not on file    Active member of club or organization: Not on file    Attends meetings of clubs or organizations: Not on file    Relationship status: Not on file  . Intimate partner violence:    Fear of current or ex partner: Not on file    Emotionally abused: Not on file    Physically abused: Not on file    Forced sexual activity: Not on file  Other Topics Concern  . Not on file  Social History Narrative  . Not on file    Past Surgical  History:  Procedure Laterality Date  . APPENDECTOMY  1972  . [place]  . VASECTOMY  2000    Family History  Problem Relation Age of Onset  . Colon cancer Neg Hx     No Known Allergies  Current Outpatient Medications on File Prior to Visit  Medication Sig Dispense Refill  . Dulaglutide (TRULICITY) 1.5 YQ/6.5HQ SOPN INJECT 1.5 MG SUBCUTANEOUSLY ONCE A WEEK 4 pen 6  . empagliflozin (JARDIANCE) 25 MG TABS tablet Take 25 mg by mouth daily. 90 tablet 5  . glimepiride (AMARYL) 4 MG tablet TAKE ONE TABLET BY MOUTH ONCE DAILY WITH BREAKFAST 90 tablet 11  . metFORMIN (GLUCOPHAGE) 1000 MG tablet TAKE 1 TABLET BY MOUTH TWICE DAILY WITH  A  MEAL 180 tablet 4  . pioglitazone (ACTOS) 45 MG tablet Take 1 tablet (45 mg total) by mouth daily. 90 tablet 10   No current facility-administered medications on file prior to visit.     BP 120/80 (BP Location: Right Arm, Patient Position: Sitting, Cuff Size: Large)   Pulse 91   Temp 98.3  F (36.8 C) (Oral)   Wt 280 lb 6.4 oz (127.2 kg)   SpO2 97%   BMI 36.00 kg/m     Review of Systems  Constitutional: Negative for appetite change, chills, fatigue and fever.  HENT: Negative for congestion, dental problem, ear pain, hearing loss, sore throat, tinnitus, trouble swallowing and voice change.   Eyes: Negative for pain, discharge and visual disturbance.  Respiratory: Negative for cough, chest tightness, wheezing and stridor.   Cardiovascular: Negative for chest pain, palpitations and leg swelling.  Gastrointestinal: Negative for abdominal distention, abdominal pain, blood in stool, constipation, diarrhea, nausea and vomiting.  Genitourinary: Negative for difficulty urinating, discharge, flank pain, genital sores, hematuria and urgency.  Musculoskeletal: Negative for arthralgias, back pain, gait problem, joint swelling, myalgias and neck stiffness.  Skin: Negative for rash.  Neurological: Negative for dizziness, syncope, speech difficulty,  weakness, numbness and headaches.  Hematological: Negative for adenopathy. Does not bruise/bleed easily.  Psychiatric/Behavioral: Negative for behavioral problems and dysphoric mood. The patient is not nervous/anxious.        Objective:   Physical Exam  Constitutional: He is oriented to person, place, and time. He appears well-developed.  Weights to 80 Blood pressure 120/72  HENT:  Head: Normocephalic.  Right Ear: External ear normal.  Left Ear: External ear normal.  Eyes: Conjunctivae and EOM are normal.  Neck: Normal range of motion.  Cardiovascular: Normal rate and normal heart sounds.  Pulmonary/Chest: Breath sounds normal.  Abdominal: Bowel sounds are normal.  Musculoskeletal: Normal range of motion. He exhibits no edema or tenderness.  Neurological: He is alert and oriented to person, place, and time.  Psychiatric: He has a normal mood and affect. His behavior is normal.          Assessment & Plan:   Diabetes mellitus.  No change in medical regimen.  Nonpharmacologic issues discussed.  Will attempt additional weight reduction  Medications updated Follow-up 3 months with new provider Annual eye examination encouraged  Marletta Lor

## 2018-03-29 ENCOUNTER — Encounter (HOSPITAL_COMMUNITY): Payer: Self-pay | Admitting: Emergency Medicine

## 2018-03-29 ENCOUNTER — Emergency Department (HOSPITAL_COMMUNITY): Payer: 59

## 2018-03-29 ENCOUNTER — Inpatient Hospital Stay (HOSPITAL_COMMUNITY)
Admission: EM | Admit: 2018-03-29 | Discharge: 2018-04-02 | DRG: 389 | Disposition: A | Discharge status: Home / Self Care | Payer: 59 | Attending: Family Medicine | Admitting: Family Medicine

## 2018-03-29 DIAGNOSIS — Z86010 Personal history of colonic polyps: Secondary | ICD-10-CM

## 2018-03-29 DIAGNOSIS — D72829 Elevated white blood cell count, unspecified: Secondary | ICD-10-CM | POA: Diagnosis present

## 2018-03-29 DIAGNOSIS — K565 Intestinal adhesions [bands], unspecified as to partial versus complete obstruction: Secondary | ICD-10-CM | POA: Diagnosis not present

## 2018-03-29 DIAGNOSIS — K5790 Diverticulosis of intestine, part unspecified, without perforation or abscess without bleeding: Secondary | ICD-10-CM | POA: Diagnosis present

## 2018-03-29 DIAGNOSIS — K56609 Unspecified intestinal obstruction, unspecified as to partial versus complete obstruction: Secondary | ICD-10-CM | POA: Diagnosis present

## 2018-03-29 DIAGNOSIS — Z7984 Long term (current) use of oral hypoglycemic drugs: Secondary | ICD-10-CM

## 2018-03-29 DIAGNOSIS — E119 Type 2 diabetes mellitus without complications: Secondary | ICD-10-CM

## 2018-03-29 DIAGNOSIS — Z6836 Body mass index (BMI) 36.0-36.9, adult: Secondary | ICD-10-CM

## 2018-03-29 DIAGNOSIS — Z79899 Other long term (current) drug therapy: Secondary | ICD-10-CM

## 2018-03-29 DIAGNOSIS — N179 Acute kidney failure, unspecified: Secondary | ICD-10-CM | POA: Diagnosis present

## 2018-03-29 DIAGNOSIS — R109 Unspecified abdominal pain: Secondary | ICD-10-CM | POA: Diagnosis not present

## 2018-03-29 DIAGNOSIS — Z9049 Acquired absence of other specified parts of digestive tract: Secondary | ICD-10-CM

## 2018-03-29 DIAGNOSIS — E785 Hyperlipidemia, unspecified: Secondary | ICD-10-CM | POA: Diagnosis present

## 2018-03-29 DIAGNOSIS — E669 Obesity, unspecified: Secondary | ICD-10-CM | POA: Diagnosis present

## 2018-03-29 LAB — CBC
HCT: 50.3 % (ref 39.0–52.0)
Hemoglobin: 16.2 g/dL (ref 13.0–17.0)
MCH: 29.5 pg (ref 26.0–34.0)
MCHC: 32.2 g/dL (ref 30.0–36.0)
MCV: 91.5 fL (ref 80.0–100.0)
PLATELETS: 352 10*3/uL (ref 150–400)
RBC: 5.5 MIL/uL (ref 4.22–5.81)
RDW: 14.7 % (ref 11.5–15.5)
WBC: 13.1 10*3/uL — ABNORMAL HIGH (ref 4.0–10.5)
nRBC: 0 % (ref 0.0–0.2)

## 2018-03-29 LAB — URINALYSIS, ROUTINE W REFLEX MICROSCOPIC
Bacteria, UA: NONE SEEN
Bilirubin Urine: NEGATIVE
Glucose, UA: >=500 mg/dL — AB
Hgb urine dipstick: NEGATIVE
Ketones, ur: 80 mg/dL — AB
Leukocytes, UA: NEGATIVE
Nitrite: NEGATIVE
Protein, ur: NEGATIVE mg/dL
Specific Gravity, Urine: 1.036 — ABNORMAL HIGH (ref 1.005–1.030)
pH: 5 (ref 5.0–8.0)

## 2018-03-29 LAB — COMPREHENSIVE METABOLIC PANEL
ALT: 29 U/L (ref 0–44)
AST: 26 U/L (ref 15–41)
Albumin: 4.5 g/dL (ref 3.5–5.0)
Alkaline Phosphatase: 52 U/L (ref 38–126)
Anion gap: 19 — ABNORMAL HIGH (ref 5–15)
BUN: 11 mg/dL (ref 6–20)
CO2: 26 mmol/L (ref 22–32)
Calcium: 10.4 mg/dL — ABNORMAL HIGH (ref 8.9–10.3)
Chloride: 94 mmol/L — ABNORMAL LOW (ref 98–111)
Creatinine, Ser: 1.31 mg/dL — ABNORMAL HIGH (ref 0.61–1.24)
GFR calc Af Amer: >60 mL/min (ref >60)
GFR calc non Af Amer: >60 mL/min (ref >60)
Glucose, Bld: 214 mg/dL — ABNORMAL HIGH (ref 70–99)
Potassium: 5 mmol/L (ref 3.5–5.1)
Sodium: 139 mmol/L (ref 135–145)
Total Bilirubin: 1 mg/dL (ref 0.3–1.2)
Total Protein: 8.3 g/dL — ABNORMAL HIGH (ref 6.5–8.1)

## 2018-03-29 LAB — LIPASE, BLOOD: Lipase: 22 U/L (ref 11–51)

## 2018-03-29 MED ORDER — HYDROMORPHONE HCL 1 MG/ML IJ SOLN
1.0000 mg | Freq: Once | INTRAMUSCULAR | Status: AC | PRN
  Administered 2018-03-30: 1 mg via INTRAVENOUS
  Filled 2018-03-29: qty 1

## 2018-03-29 MED ORDER — METOCLOPRAMIDE HCL 5 MG/ML IJ SOLN
10.0000 mg | INTRAMUSCULAR | Status: AC
  Administered 2018-03-29: 10 mg via INTRAVENOUS
  Filled 2018-03-29: qty 2

## 2018-03-29 MED ORDER — HYDROMORPHONE HCL 1 MG/ML IJ SOLN
1.0000 mg | Freq: Once | INTRAMUSCULAR | Status: AC
  Administered 2018-03-29: 1 mg via INTRAVENOUS
  Filled 2018-03-29: qty 1

## 2018-03-29 MED ORDER — SODIUM CHLORIDE 0.9 % IV BOLUS
1000.0000 mL | Freq: Once | INTRAVENOUS | Status: AC
  Administered 2018-03-29: 1000 mL via INTRAVENOUS

## 2018-03-29 MED ORDER — SODIUM CHLORIDE 0.9 % IV BOLUS
1000.0000 mL | Freq: Once | INTRAVENOUS | Status: AC
  Administered 2018-03-30: 1000 mL via INTRAVENOUS

## 2018-03-29 MED ORDER — IOHEXOL 300 MG/ML  SOLN
100.0000 mL | Freq: Once | INTRAMUSCULAR | Status: AC | PRN
  Administered 2018-03-29: 100 mL via INTRAVENOUS

## 2018-03-29 NOTE — ED Triage Notes (Signed)
Pt to ED with c/o generalized abd pain, onset this am with nausea and vomiting.  Pt was seen at a Urgent Care earlier today but pain has gotten worse since this.  Pt's abd distended and tender to palpation.

## 2018-03-29 NOTE — ED Provider Notes (Signed)
[facility] EMERGENCY DEPARTMENT Provider Note   CSN: [id number] Arrival date & time: 03/29/18  2131     History   Chief Complaint Chief Complaint  Patient presents with  . Abdominal Pain  . Emesis    HPI Douglas Reeves is a 57 y.o. male.  57 year old male with hx of DM and HLD presents to the emergency department for evaluation of abdominal pain.  States that abdominal pain began this morning.  It has been diffuse.  Began gradually, but has steadily worsened.  The patient went to urgent care earlier today.  Had blood work drawn and a urine test performed, but is unaware of any of these results.  Presented to the ED for worsening pain with persistent nausea and vomiting.  Has had 4 episodes of nonbloody/nonbilious emesis.  Had a very small bowel movement earlier today, but denies any bowel movements or passing of flatus since.  Has tried Nexium for symptoms without relief.  No fevers, urinary symptoms, melena, hematochezia. Abdominal SHx significant for appendectomy.     Past Medical History:  Diagnosis Date  . Diabetes mellitus     Patient Active Problem List   Diagnosis Date Noted  . SBO (small bowel obstruction) ([place]) 03/30/2018  . Diabetes mellitus without complication ([place]) 69/67/8938  . EXOGENOUS OBESITY 09/13/2006    Past Surgical History:  Procedure Laterality Date  . APPENDECTOMY  1972  . [place]  . VASECTOMY  2000       Home Medications    Prior to Admission medications   Medication Sig Start Date End Date Taking? Authorizing Provider  atorvastatin (LIPITOR) 20 MG tablet Take 20 mg by mouth at bedtime.   Yes [provider]  Dulaglutide (TRULICITY) 1.5 BO/1.7PZ SOPN INJECT 1.5 MG SUBCUTANEOUSLY ONCE A WEEK Patient taking differently: Inject 1.5 mg into the skin once a week. On Sunday 12/28/17  Yes Marletta Lor, MD  empagliflozin (JARDIANCE) 25 MG TABS tablet Take 25 mg by mouth daily. 12/28/17  Yes Marletta Lor, MD  glimepiride (AMARYL) 4 MG tablet TAKE ONE TABLET BY MOUTH ONCE DAILY WITH BREAKFAST Patient taking differently: Take 4 mg by mouth daily with breakfast.  12/28/17  Yes Marletta Lor, MD  metFORMIN (GLUCOPHAGE) 1000 MG tablet TAKE 1 TABLET BY MOUTH TWICE DAILY WITH  A  MEAL Patient taking differently: Take 1,000 mg by mouth 2 (two) times daily with a meal.  12/28/17  Yes Marletta Lor, MD  pioglitazone (ACTOS) 45 MG tablet Take 1 tablet (45 mg total) by mouth daily. 12/28/17  Yes Marletta Lor, MD    Family History Family History  Problem Relation Age of Onset  . Colon cancer Neg Hx     Social History Social History   Tobacco Use  . Smoking status: Never Smoker  . Smokeless tobacco: Never Used  Substance Use Topics  . Alcohol use: No    Alcohol/week: 0.0 standard drinks  . Drug use: No     Allergies   Patient has no known allergies.   Review of Systems Review of Systems Ten systems reviewed and are negative for acute change, except as noted in the HPI.    Physical Exam Updated Vital Signs BP (!) 142/84   Pulse 94   Resp (!) 25   Ht 6\' 2"  (1.88 m)   Wt 130.6 kg   SpO2 97%   BMI 36.98 kg/m   Physical Exam Vitals signs and nursing note reviewed.  Constitutional:      General: He is not in acute distress.    Appearance: He is well-developed. He is not diaphoretic.     Comments: Appears uncomfortable. Nontoxic.  HENT:     Head: Normocephalic and atraumatic.  Eyes:     General: No scleral icterus.    Conjunctiva/sclera: Conjunctivae normal.  Neck:     Musculoskeletal: Normal range of motion.  Cardiovascular:     Rate and Rhythm: Regular rhythm. Tachycardia present.     Pulses: Normal pulses.     Comments: Mild tachycardia Pulmonary:     Effort: Pulmonary effort is normal. No respiratory distress.     Comments: Respirations even and unlabored Abdominal:     General: There is distension.     Palpations: There is no mass.      Tenderness: There is abdominal tenderness.     Comments: Obese, distended abdomen with diffuse TTP. No palpable masses.  Musculoskeletal: Normal range of motion.  Skin:    General: Skin is warm and dry.     Coloration: Skin is not pale.     Findings: No erythema or rash.  Neurological:     Mental Status: He is alert and oriented to person, place, and time.     Comments: GCS 15. Moving all extremities.  Psychiatric:        Behavior: Behavior normal.      ED Treatments / Results  Labs (all labs ordered are listed, but only abnormal results are displayed) Labs Reviewed  COMPREHENSIVE METABOLIC PANEL - Abnormal; Notable for the following components:      Result Value   Chloride 94 (*)    Glucose, Bld 214 (*)    Creatinine, Ser 1.31 (*)    Calcium 10.4 (*)    Total Protein 8.3 (*)    Anion gap 19 (*)    All other components within normal limits  CBC - Abnormal; Notable for the following components:   WBC 13.1 (*)    All other components within normal limits  URINALYSIS, ROUTINE W REFLEX MICROSCOPIC - Abnormal; Notable for the following components:   Color, Urine STRAW (*)    Specific Gravity, Urine 1.036 (*)    Glucose, UA >=500 (*)    Ketones, ur 80 (*)    All other components within normal limits  LIPASE, BLOOD    EKG None  Radiology Dg Abd 1 View  Result Date: 03/29/2018 CLINICAL DATA:  Abdominal pain. Nausea and vomiting. Abdominal distension. Evaluation at urgent care this morning, progressive pain since that time. EXAM: ABDOMEN - 1 VIEW COMPARISON:  None. FINDINGS: Upright view of the abdomen. No evidence of free air, right hemidiaphragm not entirely included in the field of view. Scattered air-fluid levels throughout bowel loops in the central abdomen with generalized paucity of bowel gas. Small volume of stool in the ascending and splenic flexure of the colon. Pelvic calcifications likely phleboliths. IMPRESSION: Upright view of the abdomen demonstrating no evidence  of free air. CT is in progress for evaluation of air-fluid levels within small bowel in the central abdomen (images not yet available). Electronically Signed   By: Keith Rake M.D.   On: 03/29/2018 23:30   Ct Abdomen Pelvis W Contrast  Result Date: 03/29/2018 CLINICAL DATA:  Abdominal pain with nausea and vomiting EXAM: CT ABDOMEN AND PELVIS WITH CONTRAST TECHNIQUE: Multidetector CT imaging of the abdomen and pelvis was performed using the standard protocol following bolus administration of intravenous contrast. CONTRAST:  16mL OMNIPAQUE IOHEXOL 300  MG/ML  SOLN COMPARISON:  CT abdomen pelvis 09/06/2014 FINDINGS: LOWER CHEST: There is no basilar pleural or apical pericardial effusion. HEPATOBILIARY: Diffuse hypoattenuation of the liver relative to the spleen suggests hepatic steatosis. No focal liver lesion or biliary dilatation. The gallbladder is normal. PANCREAS: The pancreatic parenchymal contours are normal and there is no ductal dilatation. There is no peripancreatic fluid collection. SPLEEN: Normal. ADRENALS/URINARY TRACT: --Adrenal glands: Normal. --Right kidney/ureter: No hydronephrosis, nephroureterolithiasis, perinephric stranding or solid renal mass. --Left kidney/ureter: No hydronephrosis, nephroureterolithiasis, perinephric stranding or solid renal mass. --Urinary bladder: Normal for degree of distention STOMACH/BOWEL: --Stomach/Duodenum: There is no hiatal hernia or other gastric abnormality. The duodenal course and caliber are normal. --Small bowel: There is diffuse dilatation of the proximal small bowel with a transition point in the mid abdomen (series 3, image 61). The remainder of the small bowel is decompressed. No free intraperitoneal air. No inflammatory change. --Colon: No focal abnormality. --Appendix: Surgically absent. VASCULAR/LYMPHATIC: Normal course and caliber of the major abdominal vessels. No abdominal or pelvic lymphadenopathy. REPRODUCTIVE: Normal prostate size with  symmetric seminal vesicles. MUSCULOSKELETAL. No bony spinal canal stenosis or focal osseous abnormality. OTHER: None. IMPRESSION: 1. Small bowel obstruction with transition point in the mid abdomen. 2. Hepatic steatosis. Electronically Signed   By: Ulyses Jarred M.D.   On: 03/29/2018 23:54    Procedures Procedures (including critical care time)  Medications Ordered in ED Medications  sodium chloride 0.9 % bolus 1,000 mL (1,000 mLs Intravenous New Bag/Given 03/30/18 0006)  HYDROmorphone (DILAUDID) injection 1 mg (has no administration in time range)  HYDROmorphone (DILAUDID) injection 1 mg (1 mg Intravenous Given 03/29/18 2252)  metoCLOPramide (REGLAN) injection 10 mg (10 mg Intravenous Given 03/29/18 2252)  sodium chloride 0.9 % bolus 1,000 mL (0 mLs Intravenous Stopped 03/30/18 0006)  iohexol (OMNIPAQUE) 300 MG/ML solution 100 mL (100 mLs Intravenous Contrast Given 03/29/18 2322)     Initial Impression / Assessment and Plan / ED Course  I have reviewed the triage vital signs and the nursing notes.  Pertinent labs & imaging results that were available during my care of the patient were reviewed by me and considered in my medical decision making (see chart for details).     57 year old male presents to the ED for evaluation of progressively worsening abdominal pain.  Had a small bowel movement earlier in the afternoon yesterday.  Has not been passing flatus since.  Has had 4 episodes of emesis.  His CT scan is consistent with an SBO with transition point in the mid abdomen.  NG tube ordered for decompression.  Plan for hospitalist admission.  Pending surgical consultation.  12:58 AM CCS with plans to formally consult on patient in the AM.   Vitals:   03/29/18 2315 03/29/18 2345 03/30/18 0015 03/30/18 0045  BP: (!) 150/69 (!) 157/71 (!) 142/84 (!) 146/78  Pulse: (!) 106 99 94 93  Resp: (!) 27 (!) 26 (!) 25 (!) 28  SpO2: 91% 96% 97% 98%  Weight:      Height:        Final Clinical  Impressions(s) / ED Diagnoses   Final diagnoses:  SBO (small bowel obstruction) [facility])    ED Discharge Orders    None       Antonietta Breach, PA-C 03/30/18 2751    Blanchie Dessert, MD 03/30/18 2008

## 2018-03-30 ENCOUNTER — Other Ambulatory Visit: Payer: Self-pay

## 2018-03-30 ENCOUNTER — Inpatient Hospital Stay (HOSPITAL_COMMUNITY): Payer: 59

## 2018-03-30 ENCOUNTER — Encounter (HOSPITAL_COMMUNITY): Payer: Self-pay | Admitting: Internal Medicine

## 2018-03-30 DIAGNOSIS — E785 Hyperlipidemia, unspecified: Secondary | ICD-10-CM | POA: Diagnosis present

## 2018-03-30 DIAGNOSIS — K56609 Unspecified intestinal obstruction, unspecified as to partial versus complete obstruction: Secondary | ICD-10-CM | POA: Diagnosis not present

## 2018-03-30 DIAGNOSIS — K565 Intestinal adhesions [bands], unspecified as to partial versus complete obstruction: Secondary | ICD-10-CM | POA: Diagnosis present

## 2018-03-30 DIAGNOSIS — Z9049 Acquired absence of other specified parts of digestive tract: Secondary | ICD-10-CM | POA: Diagnosis not present

## 2018-03-30 DIAGNOSIS — Z7984 Long term (current) use of oral hypoglycemic drugs: Secondary | ICD-10-CM | POA: Diagnosis not present

## 2018-03-30 DIAGNOSIS — K5790 Diverticulosis of intestine, part unspecified, without perforation or abscess without bleeding: Secondary | ICD-10-CM | POA: Diagnosis present

## 2018-03-30 DIAGNOSIS — E669 Obesity, unspecified: Secondary | ICD-10-CM | POA: Diagnosis present

## 2018-03-30 DIAGNOSIS — E119 Type 2 diabetes mellitus without complications: Secondary | ICD-10-CM

## 2018-03-30 DIAGNOSIS — N179 Acute kidney failure, unspecified: Secondary | ICD-10-CM | POA: Diagnosis present

## 2018-03-30 DIAGNOSIS — D72829 Elevated white blood cell count, unspecified: Secondary | ICD-10-CM | POA: Diagnosis present

## 2018-03-30 DIAGNOSIS — R109 Unspecified abdominal pain: Secondary | ICD-10-CM | POA: Diagnosis present

## 2018-03-30 DIAGNOSIS — Z79899 Other long term (current) drug therapy: Secondary | ICD-10-CM | POA: Diagnosis not present

## 2018-03-30 DIAGNOSIS — Z86010 Personal history of colonic polyps: Secondary | ICD-10-CM | POA: Diagnosis not present

## 2018-03-30 DIAGNOSIS — Z6836 Body mass index (BMI) 36.0-36.9, adult: Secondary | ICD-10-CM | POA: Diagnosis not present

## 2018-03-30 HISTORY — DX: Unspecified intestinal obstruction, unspecified as to partial versus complete obstruction: K56.609

## 2018-03-30 LAB — MRSA PCR SCREENING: MRSA by PCR: NEGATIVE

## 2018-03-30 LAB — BASIC METABOLIC PANEL
Anion gap: 15 (ref 5–15)
BUN: 11 mg/dL (ref 6–20)
CO2: 22 mmol/L (ref 22–32)
Calcium: 9.2 mg/dL (ref 8.9–10.3)
Chloride: 102 mmol/L (ref 98–111)
Creatinine, Ser: 1.23 mg/dL (ref 0.61–1.24)
GFR calc Af Amer: >60 mL/min (ref >60)
GFR calc non Af Amer: >60 mL/min (ref >60)
GLUCOSE: 168 mg/dL — AB (ref 70–99)
Potassium: 4.6 mmol/L (ref 3.5–5.1)
Sodium: 139 mmol/L (ref 135–145)

## 2018-03-30 LAB — GLUCOSE, CAPILLARY
GLUCOSE-CAPILLARY: 180 mg/dL — AB (ref 70–99)
Glucose-Capillary: 142 mg/dL — ABNORMAL HIGH (ref 70–99)
Glucose-Capillary: 126 mg/dL — ABNORMAL HIGH (ref 70–99)
Glucose-Capillary: 153 mg/dL — ABNORMAL HIGH (ref 70–99)
Glucose-Capillary: 140 mg/dL — ABNORMAL HIGH (ref 70–99)
Glucose-Capillary: 146 mg/dL — ABNORMAL HIGH (ref 70–99)

## 2018-03-30 LAB — HIV ANTIBODY (ROUTINE TESTING W REFLEX): HIV Screen 4th Generation wRfx: NONREACTIVE

## 2018-03-30 LAB — CBC
HCT: 47.3 % (ref 39.0–52.0)
Hemoglobin: 14.9 g/dL (ref 13.0–17.0)
MCH: 28.9 pg (ref 26.0–34.0)
MCHC: 31.5 g/dL (ref 30.0–36.0)
MCV: 91.8 fL (ref 80.0–100.0)
Platelets: 279 10*3/uL (ref 150–400)
RBC: 5.15 MIL/uL (ref 4.22–5.81)
RDW: 14.8 % (ref 11.5–15.5)
WBC: 11.6 10*3/uL — ABNORMAL HIGH (ref 4.0–10.5)
nRBC: 0 % (ref 0.0–0.2)

## 2018-03-30 MED ORDER — HYDROMORPHONE HCL 1 MG/ML IJ SOLN
1.0000 mg | INTRAMUSCULAR | Status: AC | PRN
  Administered 2018-03-30: 1 mg via INTRAVENOUS
  Filled 2018-03-30: qty 1

## 2018-03-30 MED ORDER — ACETAMINOPHEN 650 MG RE SUPP: 650.0000 mg | Freq: Four times a day (QID) | RECTAL | Status: DC | PRN

## 2018-03-30 MED ORDER — INSULIN ASPART 100 UNIT/ML ~~LOC~~ SOLN
0.0000 [IU] | SUBCUTANEOUS | Status: DC
  Administered 2018-03-30 (×2): 2 [IU] via SUBCUTANEOUS
  Administered 2018-03-30 – 2018-03-31 (×4): 1 [IU] via SUBCUTANEOUS
  Administered 2018-03-31: 2 [IU] via SUBCUTANEOUS
  Administered 2018-04-01: 1 [IU] via SUBCUTANEOUS
  Administered 2018-04-01 – 2018-04-02 (×4): 2 [IU] via SUBCUTANEOUS
  Administered 2018-04-02: 3 [IU] via SUBCUTANEOUS

## 2018-03-30 MED ORDER — MORPHINE SULFATE (PF) 2 MG/ML IV SOLN
1.0000 mg | INTRAVENOUS | Status: DC | PRN
  Administered 2018-03-30 (×2): 1 mg via INTRAVENOUS
  Filled 2018-03-30 (×2): qty 1

## 2018-03-30 MED ORDER — HYDROMORPHONE HCL 1 MG/ML IJ SOLN
1.0000 mg | INTRAMUSCULAR | Status: DC | PRN
  Administered 2018-03-30 (×4): 1 mg via INTRAVENOUS
  Filled 2018-03-30 (×4): qty 1

## 2018-03-30 MED ORDER — LACTATED RINGERS IV SOLN
INTRAVENOUS | Status: AC
  Administered 2018-03-30 – 2018-03-31 (×3): via INTRAVENOUS

## 2018-03-30 MED ORDER — ACETAMINOPHEN 325 MG PO TABS: 650.0000 mg | ORAL_TABLET | Freq: Four times a day (QID) | ORAL | Status: DC | PRN

## 2018-03-30 MED ORDER — DIATRIZOATE MEGLUMINE & SODIUM 66-10 % PO SOLN
90.0000 mL | Freq: Once | ORAL | Status: AC
  Administered 2018-03-30: 90 mL via NASOGASTRIC
  Filled 2018-03-30: qty 90

## 2018-03-30 MED ORDER — ONDANSETRON HCL 4 MG PO TABS: 4.0000 mg | ORAL_TABLET | Freq: Four times a day (QID) | ORAL | Status: DC | PRN

## 2018-03-30 MED ORDER — ONDANSETRON HCL 4 MG/2ML IJ SOLN: 4.0000 mg | Freq: Four times a day (QID) | INTRAMUSCULAR | Status: DC | PRN

## 2018-03-30 NOTE — H&P (Signed)
History and Physical    Berk Pilot ZOX:[id number] DOB: 04/08/1961 DOA: 03/29/2018  PCP: Marletta Lor, MD  Patient coming from: Home.  Chief Complaint: Abdominal pain.  HPI: Douglas Reeves is a 57 y.o. male with history of diabetes mellitus type 2 presents to the ER because of persistent abdominal pain with nausea vomiting over the last 48 hours.  Last bowel movement was more than 24 hours ago.  Pain is mostly in the epigastric area crampy in nature.  Denies any blood in the vomitus.  ED Course: In the ER CAT scan shows small bowel obstruction and on-call general surgeon Dr. Georgette Dover has been consulted and patient is placed on NG tube suction and admitted for further management.  Review of Systems: As per HPI, rest all negative.   Past Medical History:  Diagnosis Date  . Diabetes mellitus     Past Surgical History:  Procedure Laterality Date  . APPENDECTOMY  1972  . [place]  . VASECTOMY  2000     reports that he has never smoked. He has never used smokeless tobacco. He reports that he does not drink alcohol or use drugs.  No Known Allergies  Family History  Problem Relation Age of Onset  . Colon cancer Neg Hx     Prior to Admission medications   Medication Sig Start Date End Date Taking? Authorizing Provider  atorvastatin (LIPITOR) 20 MG tablet Take 20 mg by mouth at bedtime.   Yes [provider]  Dulaglutide (TRULICITY) 1.5 WJ/1.9JY SOPN INJECT 1.5 MG SUBCUTANEOUSLY ONCE A WEEK Patient taking differently: Inject 1.5 mg into the skin once a week. On Sunday 12/28/17  Yes Marletta Lor, MD  empagliflozin (JARDIANCE) 25 MG TABS tablet Take 25 mg by mouth daily. 12/28/17  Yes Marletta Lor, MD  glimepiride (AMARYL) 4 MG tablet TAKE ONE TABLET BY MOUTH ONCE DAILY WITH BREAKFAST Patient taking differently: Take 4 mg by mouth daily with breakfast.  12/28/17  Yes Marletta Lor, MD  metFORMIN (GLUCOPHAGE) 1000 MG tablet TAKE 1 TABLET  BY MOUTH TWICE DAILY WITH  A  MEAL Patient taking differently: Take 1,000 mg by mouth 2 (two) times daily with a meal.  12/28/17  Yes Marletta Lor, MD  pioglitazone (ACTOS) 45 MG tablet Take 1 tablet (45 mg total) by mouth daily. 12/28/17  Yes Marletta Lor, MD    Physical Exam: Vitals:   03/29/18 2315 03/29/18 2345 03/30/18 0015 03/30/18 0045  BP: (!) 150/69 (!) 157/71 (!) 142/84 (!) 146/78  Pulse: (!) 106 99 94 93  Resp: (!) 27 (!) 26 (!) 25 (!) 28  SpO2: 91% 96% 97% 98%  Weight:      Height:          Constitutional: Moderately built and nourished. Vitals:   03/29/18 2315 03/29/18 2345 03/30/18 0015 03/30/18 0045  BP: (!) 150/69 (!) 157/71 (!) 142/84 (!) 146/78  Pulse: (!) 106 99 94 93  Resp: (!) 27 (!) 26 (!) 25 (!) 28  SpO2: 91% 96% 97% 98%  Weight:      Height:       Eyes: Anicteric no pallor. ENMT: No discharge from the ears eyes nose or mouth. Neck: No mass or.  No neck rigidity. Respiratory: No rhonchi or crepitations. Cardiovascular: S1-S2 heard. Abdomen: Soft distended bowel sounds not appreciated. Musculoskeletal: No edema. Skin: No rash. Neurologic: Alert awake oriented to time place and person.  Moves all extremities. Psychiatric: Appears normal.  Labs on Admission: I have personally reviewed following labs and imaging studies  CBC: Recent Labs  Lab 03/29/18 2213  WBC 13.1*  HGB 16.2  HCT 50.3  MCV 91.5  PLT 235   Basic Metabolic Panel: Recent Labs  Lab 03/29/18 2213  NA 139  K 5.0  CL 94*  CO2 26  GLUCOSE 214*  BUN 11  CREATININE 1.31*  CALCIUM 10.4*   GFR: Estimated Creatinine Clearance: 90.5 mL/min (A) (by C-G formula based on SCr of 1.31 mg/dL (H)). Liver Function Tests: Recent Labs  Lab 03/29/18 2213  AST 26  ALT 29  ALKPHOS 52  BILITOT 1.0  PROT 8.3*  ALBUMIN 4.5   Recent Labs  Lab 03/29/18 2213  LIPASE 22   No results for input(s): AMMONIA in the last 168 hours. Coagulation Profile: No results for  input(s): INR, PROTIME in the last 168 hours. Cardiac Enzymes: No results for input(s): CKTOTAL, CKMB, CKMBINDEX, TROPONINI in the last 168 hours. BNP (last 3 results) No results for input(s): PROBNP in the last 8760 hours. HbA1C: No results for input(s): HGBA1C in the last 72 hours. CBG: No results for input(s): GLUCAP in the last 168 hours. Lipid Profile: No results for input(s): CHOL, HDL, LDLCALC, TRIG, CHOLHDL, LDLDIRECT in the last 72 hours. Thyroid Function Tests: No results for input(s): TSH, T4TOTAL, FREET4, T3FREE, THYROIDAB in the last 72 hours. Anemia Panel: No results for input(s): VITAMINB12, FOLATE, FERRITIN, TIBC, IRON, RETICCTPCT in the last 72 hours. Urine analysis:    Component Value Date/Time   COLORURINE STRAW (A) 03/29/2018 2256   APPEARANCEUR CLEAR 03/29/2018 2256   LABSPEC 1.036 (H) 03/29/2018 2256   PHURINE 5.0 03/29/2018 2256   GLUCOSEU >=500 (A) 03/29/2018 2256   HGBUR NEGATIVE 03/29/2018 2256   BILIRUBINUR NEGATIVE 03/29/2018 2256   BILIRUBINUR n 06/08/2014 0833   KETONESUR 80 (A) 03/29/2018 2256   PROTEINUR NEGATIVE 03/29/2018 2256   UROBILINOGEN 0.2 06/08/2014 0833   NITRITE NEGATIVE 03/29/2018 2256   LEUKOCYTESUR NEGATIVE 03/29/2018 2256   Sepsis Labs: @LABRCNTIP (procalcitonin:4,lacticidven:4) )No results found for this or any previous visit (from the past 240 hour(s)).   Radiological Exams on Admission: Dg Abd 1 View  Result Date: 03/29/2018 CLINICAL DATA:  Abdominal pain. Nausea and vomiting. Abdominal distension. Evaluation at urgent care this morning, progressive pain since that time. EXAM: ABDOMEN - 1 VIEW COMPARISON:  None. FINDINGS: Upright view of the abdomen. No evidence of free air, right hemidiaphragm not entirely included in the field of view. Scattered air-fluid levels throughout bowel loops in the central abdomen with generalized paucity of bowel gas. Small volume of stool in the ascending and splenic flexure of the colon. Pelvic  calcifications likely phleboliths. IMPRESSION: Upright view of the abdomen demonstrating no evidence of free air. CT is in progress for evaluation of air-fluid levels within small bowel in the central abdomen (images not yet available). Electronically Signed   By: Keith Rake M.D.   On: 03/29/2018 23:30   Ct Abdomen Pelvis W Contrast  Result Date: 03/29/2018 CLINICAL DATA:  Abdominal pain with nausea and vomiting EXAM: CT ABDOMEN AND PELVIS WITH CONTRAST TECHNIQUE: Multidetector CT imaging of the abdomen and pelvis was performed using the standard protocol following bolus administration of intravenous contrast. CONTRAST:  129mL OMNIPAQUE IOHEXOL 300 MG/ML  SOLN COMPARISON:  CT abdomen pelvis 09/06/2014 FINDINGS: LOWER CHEST: There is no basilar pleural or apical pericardial effusion. HEPATOBILIARY: Diffuse hypoattenuation of the liver relative to the spleen suggests hepatic steatosis. No focal liver lesion  or biliary dilatation. The gallbladder is normal. PANCREAS: The pancreatic parenchymal contours are normal and there is no ductal dilatation. There is no peripancreatic fluid collection. SPLEEN: Normal. ADRENALS/URINARY TRACT: --Adrenal glands: Normal. --Right kidney/ureter: No hydronephrosis, nephroureterolithiasis, perinephric stranding or solid renal mass. --Left kidney/ureter: No hydronephrosis, nephroureterolithiasis, perinephric stranding or solid renal mass. --Urinary bladder: Normal for degree of distention STOMACH/BOWEL: --Stomach/Duodenum: There is no hiatal hernia or other gastric abnormality. The duodenal course and caliber are normal. --Small bowel: There is diffuse dilatation of the proximal small bowel with a transition point in the mid abdomen (series 3, image 61). The remainder of the small bowel is decompressed. No free intraperitoneal air. No inflammatory change. --Colon: No focal abnormality. --Appendix: Surgically absent. VASCULAR/LYMPHATIC: Normal course and caliber of the major  abdominal vessels. No abdominal or pelvic lymphadenopathy. REPRODUCTIVE: Normal prostate size with symmetric seminal vesicles. MUSCULOSKELETAL. No bony spinal canal stenosis or focal osseous abnormality. OTHER: None. IMPRESSION: 1. Small bowel obstruction with transition point in the mid abdomen. 2. Hepatic steatosis. Electronically Signed   By: Ulyses Jarred M.D.   On: 03/29/2018 23:54     Assessment/Plan Principal Problem:   SBO (small bowel obstruction) ([facility]) Active Problems:   Diabetes mellitus without complication ([place])   ARF (acute renal failure) ([place])    1. Small bowel obstruction -NG tube has been placed and will keep patient n.p.o. pain relief medications IV fluids and general surgery has been consulted.  Repeat KUB in a.m.  Further recommendations per surgery. 2. Diabetes mellitus type 2 we will keep patient on sliding scale coverage for now.  Follow CBGs and metabolic panel. 3. Acute renal failure likely from bowel obstruction with vomiting.  Continue hydration follow metabolic panel. 4. Leukocytosis likely reactionary.   DVT prophylaxis: SCDs. Code Status: Full code. Family Communication: Discussed with wife. Disposition Plan: Home. Consults called: General surgery. Admission status: Inpatient.   Rise Patience MD [facility] Pager [phone].  If 7PM-7AM, please contact night-coverage [email or web address] Password [email or web address]  03/30/2018, 1:26 AM

## 2018-03-30 NOTE — Progress Notes (Signed)
57 yo ? DM ty II Prior polpys sp polypectomy + diverticulosis Body mass index is 36.98 kg/m.  Prior surgeries include Back surgery for scoliosis at a young age and appendectomy  Here with SBO Surgery has seen Patient undergoing SBO protocol with Barium  discussed with family overall siutaion  Verneita Griffes, MD [facility] 9:44 AM

## 2018-03-30 NOTE — ED Notes (Signed)
Advanced OG tube 5cm per portable Xray.  Another ordered to confirm placement.

## 2018-03-30 NOTE — Consult Note (Signed)
Reason for Consult:Small bowel obstruction Referring Physician: Kate Sweetman is an 57 y.o. male.  HPI: This is a 57 year old male with diabetes s/p open appendectomy in 1972 who presents with one day history of abdominal distention, pain, nausea, and vomiting.  He vomited at least four times - non-bloody.  No flatus today.  He presented to the ED for evaluation.  CT scan showed possible SBO with transition in mid-abdomen.  Past Medical History:  Diagnosis Date  . Diabetes mellitus     Past Surgical History:  Procedure Laterality Date  . APPENDECTOMY  1972  . [place]  . VASECTOMY  2000    Family History  Problem Relation Age of Onset  . Colon cancer Neg Hx     Social History:  reports that he has never smoked. He has never used smokeless tobacco. He reports that he does not drink alcohol or use drugs.  Allergies: No Known Allergies  Medications:  Prior to Admission medications   Medication Sig Start Date End Date Taking? Authorizing Provider  atorvastatin (LIPITOR) 20 MG tablet Take 20 mg by mouth at bedtime.   Yes [provider]  Dulaglutide (TRULICITY) 1.5 FG/1.8EX SOPN INJECT 1.5 MG SUBCUTANEOUSLY ONCE A WEEK Patient taking differently: Inject 1.5 mg into the skin once a week. On Sunday 12/28/17  Yes Marletta Lor, MD  empagliflozin (JARDIANCE) 25 MG TABS tablet Take 25 mg by mouth daily. 12/28/17  Yes Marletta Lor, MD  glimepiride (AMARYL) 4 MG tablet TAKE ONE TABLET BY MOUTH ONCE DAILY WITH BREAKFAST Patient taking differently: Take 4 mg by mouth daily with breakfast.  12/28/17  Yes Marletta Lor, MD  metFORMIN (GLUCOPHAGE) 1000 MG tablet TAKE 1 TABLET BY MOUTH TWICE DAILY WITH  A  MEAL Patient taking differently: Take 1,000 mg by mouth 2 (two) times daily with a meal.  12/28/17  Yes Marletta Lor, MD  pioglitazone (ACTOS) 45 MG tablet Take 1 tablet (45 mg total) by mouth daily. 12/28/17  Yes Marletta Lor, MD      Results for orders placed or performed during the hospital encounter of 03/29/18 (from the past 48 hour(s))  Lipase, blood     Status: None   Collection Time: 03/29/18 10:13 PM  Result Value Ref Range   Lipase 22 11 - 51 U/L    Comment: Performed at [facility], [place] [address] [place], [place] [address]  Comprehensive metabolic panel     Status: Abnormal   Collection Time: 03/29/18 10:13 PM  Result Value Ref Range   Sodium 139 135 - 145 mmol/L   Potassium 5.0 3.5 - 5.1 mmol/L   Chloride 94 (L) 98 - 111 mmol/L   CO2 26 22 - 32 mmol/L   Glucose, Bld 214 (H) 70 - 99 mg/dL   BUN 11 6 - 20 mg/dL   Creatinine, Ser 1.31 (H) 0.61 - 1.24 mg/dL   Calcium 10.4 (H) 8.9 - 10.3 mg/dL   Total Protein 8.3 (H) 6.5 - 8.1 g/dL   Albumin 4.5 3.5 - 5.0 g/dL   AST 26 15 - 41 U/L   ALT 29 0 - 44 U/L   Alkaline Phosphatase 52 38 - 126 U/L   Total Bilirubin 1.0 0.3 - 1.2 mg/dL   GFR calc non Af Amer >60 >60 mL/min   GFR calc Af Amer >60 >60 mL/min   Anion gap 19 (H) 5 - 15    Comment: Performed at  [facility], [place] [address], [place], [place] [address]  CBC     Status: Abnormal   Collection Time: 03/29/18 10:13 PM  Result Value Ref Range   WBC 13.1 (H) 4.0 - 10.5 K/uL   RBC 5.50 4.22 - 5.81 MIL/uL   Hemoglobin 16.2 13.0 - 17.0 g/dL   HCT 50.3 39.0 - 52.0 %   MCV 91.5 80.0 - 100.0 fL   MCH 29.5 26.0 - 34.0 pg   MCHC 32.2 30.0 - 36.0 g/dL   RDW 14.7 11.5 - 15.5 %   Platelets 352 150 - 400 K/uL   nRBC 0.0 0.0 - 0.2 %    Comment: Performed at [facility], [place] [address], [place], [place] [address]  Urinalysis, Routine w reflex microscopic     Status: Abnormal   Collection Time: 03/29/18 10:56 PM  Result Value Ref Range   Color, Urine STRAW (A) YELLOW   APPearance CLEAR CLEAR   Specific Gravity, Urine 1.036 (H) 1.005 - 1.030   pH 5.0 5.0 - 8.0   Glucose, UA >=500 (A) NEGATIVE mg/dL   Hgb urine dipstick NEGATIVE NEGATIVE   Bilirubin Urine NEGATIVE NEGATIVE   Ketones,  ur 80 (A) NEGATIVE mg/dL   Protein, ur NEGATIVE NEGATIVE mg/dL   Nitrite NEGATIVE NEGATIVE   Leukocytes, UA NEGATIVE NEGATIVE   RBC / HPF 0-5 0 - 5 RBC/hpf   WBC, UA 0-5 0 - 5 WBC/hpf   Bacteria, UA NONE SEEN NONE SEEN   Squamous Epithelial / LPF 0-5 0 - 5    Comment: Performed at [place] [address] [place], [place] [address]  Basic metabolic panel     Status: Abnormal   Collection Time: 03/30/18  2:17 AM  Result Value Ref Range   Sodium 139 135 - 145 mmol/L   Potassium 4.6 3.5 - 5.1 mmol/L   Chloride 102 98 - 111 mmol/L   CO2 22 22 - 32 mmol/L   Glucose, Bld 168 (H) 70 - 99 mg/dL   BUN 11 6 - 20 mg/dL   Creatinine, Ser 1.23 0.61 - 1.24 mg/dL   Calcium 9.2 8.9 - 10.3 mg/dL   GFR calc non Af Amer >60 >60 mL/min   GFR calc Af Amer >60 >60 mL/min   Anion gap 15 5 - 15    Comment: Performed at [place] [address], [place], [place] [address]  CBC     Status: Abnormal   Collection Time: 03/30/18  2:17 AM  Result Value Ref Range   WBC 11.6 (H) 4.0 - 10.5 K/uL   RBC 5.15 4.22 - 5.81 MIL/uL   Hemoglobin 14.9 13.0 - 17.0 g/dL   HCT 47.3 39.0 - 52.0 %   MCV 91.8 80.0 - 100.0 fL   MCH 28.9 26.0 - 34.0 pg   MCHC 31.5 30.0 - 36.0 g/dL   RDW 14.8 11.5 - 15.5 %   Platelets 279 150 - 400 K/uL   nRBC 0.0 0.0 - 0.2 %    Comment: Performed at [facility], [place] [address], [place], [place] [address]  MRSA PCR Screening     Status: None   Collection Time: 03/30/18  3:10 AM  Result Value Ref Range   MRSA by PCR NEGATIVE NEGATIVE    Comment:        The GeneXpert MRSA Assay (FDA approved for NASAL specimens only), is one component of a comprehensive MRSA colonization surveillance program. It is  not intended to diagnose MRSA infection nor to guide or monitor treatment for MRSA infections. Performed at [facility], [place] [address], [place], [place] [address]   Glucose, capillary     Status: Abnormal   Collection Time: 03/30/18  5:00 AM  Result  Value Ref Range   Glucose-Capillary 142 (H) 70 - 99 mg/dL    Dg Abd 1 View  Result Date: 03/29/2018 CLINICAL DATA:  Abdominal pain. Nausea and vomiting. Abdominal distension. Evaluation at urgent care this morning, progressive pain since that time. EXAM: ABDOMEN - 1 VIEW COMPARISON:  None. FINDINGS: Upright view of the abdomen. No evidence of free air, right hemidiaphragm not entirely included in the field of view. Scattered air-fluid levels throughout bowel loops in the central abdomen with generalized paucity of bowel gas. Small volume of stool in the ascending and splenic flexure of the colon. Pelvic calcifications likely phleboliths. IMPRESSION: Upright view of the abdomen demonstrating no evidence of free air. CT is in progress for evaluation of air-fluid levels within small bowel in the central abdomen (images not yet available). Electronically Signed   By: Keith Rake M.D.   On: 03/29/2018 23:30   Ct Abdomen Pelvis W Contrast  Result Date: 03/29/2018 CLINICAL DATA:  Abdominal pain with nausea and vomiting EXAM: CT ABDOMEN AND PELVIS WITH CONTRAST TECHNIQUE: Multidetector CT imaging of the abdomen and pelvis was performed using the standard protocol following bolus administration of intravenous contrast. CONTRAST:  163mL OMNIPAQUE IOHEXOL 300 MG/ML  SOLN COMPARISON:  CT abdomen pelvis 09/06/2014 FINDINGS: LOWER CHEST: There is no basilar pleural or apical pericardial effusion. HEPATOBILIARY: Diffuse hypoattenuation of the liver relative to the spleen suggests hepatic steatosis. No focal liver lesion or biliary dilatation. The gallbladder is normal. PANCREAS: The pancreatic parenchymal contours are normal and there is no ductal dilatation. There is no peripancreatic fluid collection. SPLEEN: Normal. ADRENALS/URINARY TRACT: --Adrenal glands: Normal. --Right kidney/ureter: No hydronephrosis, nephroureterolithiasis, perinephric stranding or solid renal mass. --Left kidney/ureter: No  hydronephrosis, nephroureterolithiasis, perinephric stranding or solid renal mass. --Urinary bladder: Normal for degree of distention STOMACH/BOWEL: --Stomach/Duodenum: There is no hiatal hernia or other gastric abnormality. The duodenal course and caliber are normal. --Small bowel: There is diffuse dilatation of the proximal small bowel with a transition point in the mid abdomen (series 3, image 61). The remainder of the small bowel is decompressed. No free intraperitoneal air. No inflammatory change. --Colon: No focal abnormality. --Appendix: Surgically absent. VASCULAR/LYMPHATIC: Normal course and caliber of the major abdominal vessels. No abdominal or pelvic lymphadenopathy. REPRODUCTIVE: Normal prostate size with symmetric seminal vesicles. MUSCULOSKELETAL. No bony spinal canal stenosis or focal osseous abnormality. OTHER: None. IMPRESSION: 1. Small bowel obstruction with transition point in the mid abdomen. 2. Hepatic steatosis. Electronically Signed   By: Ulyses Jarred M.D.   On: 03/29/2018 23:54   Dg Abd Portable 1 View  Result Date: 03/30/2018 CLINICAL DATA:  NG tube was advanced. EXAM: PORTABLE ABDOMEN - 1 VIEW COMPARISON:  Radiograph earlier this day at 0146 hour FINDINGS: The side-port of the enteric tube is now below the diaphragm in the stomach. Dilated small bowel on prior exam is not as well seen currently. IMPRESSION: Enteric tube appropriately positioned in the stomach. Electronically Signed   By: Keith Rake M.D.   On: 03/30/2018 02:28   Dg Abd Portable 1 View  Result Date: 03/30/2018 CLINICAL DATA:  Orogastric tube placement EXAM: PORTABLE ABDOMEN - 1 VIEW COMPARISON:  None. FINDINGS: Orogastric tube side port is just below the  level of the gastroesophageal junction. Recommend advancing by 5 cm. The tip projects over the gastric body. Lung bases are clear. IMPRESSION: Recommend advancing orogastric tube by 5 cm. Electronically Signed   By: Ulyses Jarred M.D.   On: 03/30/2018 02:02     Review of Systems  Constitutional: Negative for weight loss.  HENT: Negative for ear discharge, ear pain, hearing loss and tinnitus.   Eyes: Negative for blurred vision, double vision, photophobia and pain.  Respiratory: Negative for cough, sputum production and shortness of breath.   Cardiovascular: Negative for chest pain.  Gastrointestinal: Positive for abdominal pain, constipation, nausea and vomiting.  Genitourinary: Negative for dysuria, flank pain, frequency and urgency.  Musculoskeletal: Negative for back pain, falls, joint pain, myalgias and neck pain.  Neurological: Negative for dizziness, tingling, sensory change, focal weakness, loss of consciousness and headaches.  Endo/Heme/Allergies: Does not bruise/bleed easily.  Psychiatric/Behavioral: Negative for depression, memory loss and substance abuse. The patient is not nervous/anxious.    Blood pressure (!) 159/82, pulse (!) 106, temperature 98.6 F (37 C), temperature source Oral, resp. rate (!) 23, height 6\' 2"  (1.88 m), weight 130.6 kg, SpO2 97 %. Physical Exam WDWN in NAD Eyes:  Pupils equal, round; sclera anicteric HENT:  Oral mucosa moist; good dentition  Neck:  No masses palpated, no thyromegaly Lungs:  CTA bilaterally; normal respiratory effort CV:  Regular rate and rhythm; no murmurs; extremities well-perfused with no edema Abd:  +bowel sounds, distended, mildly tender; healed RLQ incision Skin:  Warm, dry; no sign of jaundice Psychiatric - alert and oriented x 4; calm mood and affect  Assessment/Plan: Small bowel obstruction - likely secondary to adhesions from open appendectomy.  NG suction Bowel rest - ice chips only IV hydration - renal function iproved Small bowel obstruction protocol.   Imogene Burn Carynn Felling 03/30/2018, 6:59 AM

## 2018-03-31 ENCOUNTER — Inpatient Hospital Stay (HOSPITAL_COMMUNITY): Payer: 59

## 2018-03-31 LAB — GLUCOSE, CAPILLARY
GLUCOSE-CAPILLARY: 138 mg/dL — AB (ref 70–99)
Glucose-Capillary: 147 mg/dL — ABNORMAL HIGH (ref 70–99)
Glucose-Capillary: 163 mg/dL — ABNORMAL HIGH (ref 70–99)
Glucose-Capillary: 134 mg/dL — ABNORMAL HIGH (ref 70–99)
Glucose-Capillary: 132 mg/dL — ABNORMAL HIGH (ref 70–99)

## 2018-03-31 MED ORDER — PHENOL 1.4 % MT LIQD
1.0000 | OROMUCOSAL | Status: DC | PRN
  Filled 2018-03-31: qty 177

## 2018-03-31 NOTE — Progress Notes (Signed)
[facility] Progress Note      Assessment/Plan Principal Problem:   SBO (small bowel obstruction) (HCC) Active Problems:   Diabetes mellitus without complication (HCC)   ARF (acute renal failure) (HCC)  SBO - likely 2/2 adhesions from open appendectomy - high NGT output but pt has had a lot of ice chips - passed gas once this am, abdominal pain resolved  FEN: NGT, clamp this afternoon and allow CLD if he continues to have flatus  VTE: SCD's, chem DVT per medicine ID: no abx  Follow up: TBD  DISPO: clamp NGT if continues to have flatus, AXR pending. Hopefully this resolves without the need for surgery    LOS: 1 day    Subjective: CC: SBO  No abdominal pain. Had one episode of flatus this am. No issues overnight. He has been eating a lot of ice chips. Wife at bedside.   Objective: Vital signs in last 24 hours: Temp:  [98.6 F (37 C)-99 F (37.2 C)] 98.8 F (37.1 C) (12/19 0834) Pulse Rate:  [90-100] 91 (12/19 0834) Resp:  [12-20] 20 (12/19 0834) BP: (133-162)/(69-76) 133/69 (12/19 0834) SpO2:  [94 %-97 %] 95 % (12/19 0834) Weight:  [128.6 kg] 128.6 kg (12/19 0432) Last BM Date: 03/29/18  Intake/Output from previous day: 12/18 0701 - 12/19 0700 In: 1911.4 [I.V.:1911.4] Out: 2175 [Urine:450; Emesis/NG output:1725] Intake/Output this shift: No intake/output data recorded.  PE: Gen:  Alert, NAD, pleasant, cooperative Pulm:  Rate and effort normal Abd: Soft, NT/ND, +BS, NGT output looks dark Skin: no rashes noted, warm and dry   Anti-infectives: Anti-infectives (From admission, onward)   None      Lab Results:  Recent Labs    03/29/18 2213 03/30/18 0217  WBC 13.1* 11.6*  HGB 16.2 14.9  HCT 50.3 47.3  PLT 352 279   BMET Recent Labs    03/29/18 2213 03/30/18 0217  NA 139 139  K 5.0 4.6  CL 94* 102  CO2 26 22  GLUCOSE 214* 168*  BUN 11 11  CREATININE 1.31* 1.23  CALCIUM 10.4* 9.2   PT/INR No results for input(s):  LABPROT, INR in the last 72 hours. CMP     Component Value Date/Time   NA 139 03/30/2018 0217   K 4.6 03/30/2018 0217   CL 102 03/30/2018 0217   CO2 22 03/30/2018 0217   GLUCOSE 168 (H) 03/30/2018 0217   GLUCOSE 177 (H) 03/08/2006 0807   BUN 11 03/30/2018 0217   CREATININE 1.23 03/30/2018 0217   CREATININE 0.99 10/04/2015 1717   CALCIUM 9.2 03/30/2018 0217   PROT 8.3 (H) 03/29/2018 2213   ALBUMIN 4.5 03/29/2018 2213   AST 26 03/29/2018 2213   ALT 29 03/29/2018 2213   ALKPHOS 52 03/29/2018 2213   BILITOT 1.0 03/29/2018 2213   GFRNONAA >60 03/30/2018 0217   GFRAA >60 03/30/2018 0217   Lipase     Component Value Date/Time   LIPASE 22 03/29/2018 2213    Studies/Results: Dg Abd 1 View  Result Date: 03/29/2018 CLINICAL DATA:  Abdominal pain. Nausea and vomiting. Abdominal distension. Evaluation at urgent care this morning, progressive pain since that time. EXAM: ABDOMEN - 1 VIEW COMPARISON:  None. FINDINGS: Upright view of the abdomen. No evidence of free air, right hemidiaphragm not entirely included in the field of view. Scattered air-fluid levels throughout bowel loops in the central abdomen with generalized paucity of bowel gas. Small volume of stool in the ascending and splenic flexure of the colon. Pelvic calcifications  likely phleboliths. IMPRESSION: Upright view of the abdomen demonstrating no evidence of free air. CT is in progress for evaluation of air-fluid levels within small bowel in the central abdomen (images not yet available). Electronically Signed   By: Keith Rake M.D.   On: 03/29/2018 23:30   Ct Abdomen Pelvis W Contrast  Result Date: 03/29/2018 CLINICAL DATA:  Abdominal pain with nausea and vomiting EXAM: CT ABDOMEN AND PELVIS WITH CONTRAST TECHNIQUE: Multidetector CT imaging of the abdomen and pelvis was performed using the standard protocol following bolus administration of intravenous contrast. CONTRAST:  119mL OMNIPAQUE IOHEXOL 300 MG/ML  SOLN COMPARISON:   CT abdomen pelvis 09/06/2014 FINDINGS: LOWER CHEST: There is no basilar pleural or apical pericardial effusion. HEPATOBILIARY: Diffuse hypoattenuation of the liver relative to the spleen suggests hepatic steatosis. No focal liver lesion or biliary dilatation. The gallbladder is normal. PANCREAS: The pancreatic parenchymal contours are normal and there is no ductal dilatation. There is no peripancreatic fluid collection. SPLEEN: Normal. ADRENALS/URINARY TRACT: --Adrenal glands: Normal. --Right kidney/ureter: No hydronephrosis, nephroureterolithiasis, perinephric stranding or solid renal mass. --Left kidney/ureter: No hydronephrosis, nephroureterolithiasis, perinephric stranding or solid renal mass. --Urinary bladder: Normal for degree of distention STOMACH/BOWEL: --Stomach/Duodenum: There is no hiatal hernia or other gastric abnormality. The duodenal course and caliber are normal. --Small bowel: There is diffuse dilatation of the proximal small bowel with a transition point in the mid abdomen (series 3, image 61). The remainder of the small bowel is decompressed. No free intraperitoneal air. No inflammatory change. --Colon: No focal abnormality. --Appendix: Surgically absent. VASCULAR/LYMPHATIC: Normal course and caliber of the major abdominal vessels. No abdominal or pelvic lymphadenopathy. REPRODUCTIVE: Normal prostate size with symmetric seminal vesicles. MUSCULOSKELETAL. No bony spinal canal stenosis or focal osseous abnormality. OTHER: None. IMPRESSION: 1. Small bowel obstruction with transition point in the mid abdomen. 2. Hepatic steatosis. Electronically Signed   By: Ulyses Jarred M.D.   On: 03/29/2018 23:54   Dg Abd Portable 1v-small Bowel Obstruction Protocol-initial, 8 Hr Delay  Result Date: 03/30/2018 CLINICAL DATA:  Small bowel obstruction. EXAM: PORTABLE ABDOMEN - 1 VIEW COMPARISON:  CT 03/29/2017 FINDINGS: AP supine view of the abdomen was obtained. Dilated loops of small bowel that appear to  contain dilute oral contrast or fluid. No significant gas in the rectum. Small amount of gas in the right colon. IMPRESSION: Persistent dilated loops of small bowel. Findings remain compatible with a small bowel obstruction. Electronically Signed   By: Markus Daft M.D.   On: 03/30/2018 21:21   Dg Abd Portable 1 View  Result Date: 03/30/2018 CLINICAL DATA:  NG tube was advanced. EXAM: PORTABLE ABDOMEN - 1 VIEW COMPARISON:  Radiograph earlier this day at 0146 hour FINDINGS: The side-port of the enteric tube is now below the diaphragm in the stomach. Dilated small bowel on prior exam is not as well seen currently. IMPRESSION: Enteric tube appropriately positioned in the stomach. Electronically Signed   By: Keith Rake M.D.   On: 03/30/2018 02:28   Dg Abd Portable 1 View  Result Date: 03/30/2018 CLINICAL DATA:  Orogastric tube placement EXAM: PORTABLE ABDOMEN - 1 VIEW COMPARISON:  None. FINDINGS: Orogastric tube side port is just below the level of the gastroesophageal junction. Recommend advancing by 5 cm. The tip projects over the gastric body. Lung bases are clear. IMPRESSION: Recommend advancing orogastric tube by 5 cm. Electronically Signed   By: Ulyses Jarred M.D.   On: 03/30/2018 02:02      Kalman Drape , PA-C [facility] 03/31/2018, 11:09 AM  Pager: [phone] Mon-Wed, Friday 7:00am-4:30pm Thurs 7am-11:30am  Consults: [phone]

## 2018-03-31 NOTE — Progress Notes (Signed)
[facility] PROGRESS NOTE  Douglas Reeves DTO:[id number] DOB: October 19, 1960 DOA: 03/29/2018 PCP: Marletta Lor, MD   Narrative: 57 yo ? DM ty II Prior polpys sp polypectomy + diverticulosis Body mass index is 36.98 kg/m.  Prior surgeries include Back surgery for scoliosis at a young age and appendectomy  Here with SBO Surgery has seen Patient undergoing SBO protocol with Barium   A & Plan Small bowel obstruction-Gastrografin performed 12/18 but still shows obstruction on x-ray-General surgery managing this issue-clamping trials done today on NG tube which is in place-keep n.p.o.-pain control with Dilaudid 1 mg every 2 as needed-ice chips as per general surgery Diabetes mellitus type 2-blood sugars ranging 138-180-continue sensitive sliding scale coverage as he is n.p.o. Home meds include Amaryl 4 mg metformin 1000 twice daily Actos 45 Jardiance 25 Trulicity all on hold Hyperlipidemia holding Lipitor for now Moderate obesity BMI 36    DVT prophylaxis: Lovenox code Status: Full code family Communication: Discussed with wife disposition Plan: Await resolution of SBO otherwise may need surgery   Kerriann Kamphuis, MD  [facility] Direct contact: [phone] --Via amion app OR  --[email or web address]; password [email or web address]  7PM-7AM contact night coverage as above 03/31/2018, 12:11 PM  LOS: 1 day   Consultants:  General surgery  Procedures:  None  Antimicrobials:  No  Interval history/Subjective: Awake alert pleasant putting out reasonable amounts of succus No chest pain some abdominal pain but relieved by Dilaudid Mild flatus today earlier x1 no stool  Objective:  Vitals:  Vitals:   03/31/18 0432 03/31/18 0834  BP: (!) 141/74 133/69  Pulse: 90 91  Resp: 18 20  Temp: 98.7 F (37.1 C) 98.8 F (37.1 C)  SpO2: 94% 95%    Exam:  EOMI NCAT no pallor no icterus S1-S2 no murmur Abdomen is obese and swollen and somewhat distended no bowel sounds heard no rebound no  guarding    I have personally reviewed the following:  DATA NG output is about 1.7 L Sugars ranging as above  Scheduled Meds: . insulin aspart  0-9 Units Subcutaneous Q4H   Continuous Infusions:  Principal Problem:   SBO (small bowel obstruction) (HCC) Active Problems:   Diabetes mellitus without complication (HCC)   ARF (acute renal failure) (HCC)   LOS: 1 day

## 2018-04-01 ENCOUNTER — Inpatient Hospital Stay (HOSPITAL_COMMUNITY): Payer: 59

## 2018-04-01 LAB — CBC WITH DIFFERENTIAL/PLATELET
Abs Immature Granulocytes: 0.03 10*3/uL (ref 0.00–0.07)
Basophils Absolute: 0 10*3/uL (ref 0.0–0.1)
Basophils Relative: 0 %
Eosinophils Absolute: 0.2 10*3/uL (ref 0.0–0.5)
Eosinophils Relative: 2 %
HCT: 47.6 % (ref 39.0–52.0)
Hemoglobin: 15.1 g/dL (ref 13.0–17.0)
IMMATURE GRANULOCYTES: 0 %
LYMPHS PCT: 14 %
Lymphs Abs: 1.3 10*3/uL (ref 0.7–4.0)
MCH: 29 pg (ref 26.0–34.0)
MCHC: 31.7 g/dL (ref 30.0–36.0)
MCV: 91.4 fL (ref 80.0–100.0)
Monocytes Absolute: 0.9 10*3/uL (ref 0.1–1.0)
Monocytes Relative: 10 %
Neutro Abs: 6.8 10*3/uL (ref 1.7–7.7)
Neutrophils Relative %: 74 %
Platelets: 285 10*3/uL (ref 150–400)
RBC: 5.21 MIL/uL (ref 4.22–5.81)
RDW: 14.6 % (ref 11.5–15.5)
WBC: 9.3 10*3/uL (ref 4.0–10.5)
nRBC: 0 % (ref 0.0–0.2)

## 2018-04-01 LAB — GLUCOSE, CAPILLARY
GLUCOSE-CAPILLARY: 167 mg/dL — AB (ref 70–99)
Glucose-Capillary: 117 mg/dL — ABNORMAL HIGH (ref 70–99)
Glucose-Capillary: 125 mg/dL — ABNORMAL HIGH (ref 70–99)
Glucose-Capillary: 124 mg/dL — ABNORMAL HIGH (ref 70–99)
Glucose-Capillary: 125 mg/dL — ABNORMAL HIGH (ref 70–99)
Glucose-Capillary: 155 mg/dL — ABNORMAL HIGH (ref 70–99)
Glucose-Capillary: 156 mg/dL — ABNORMAL HIGH (ref 70–99)
Glucose-Capillary: 181 mg/dL — ABNORMAL HIGH (ref 70–99)

## 2018-04-01 LAB — BASIC METABOLIC PANEL
Anion gap: 17 — ABNORMAL HIGH (ref 5–15)
BUN: 27 mg/dL — ABNORMAL HIGH (ref 6–20)
CO2: 24 mmol/L (ref 22–32)
Calcium: 8.7 mg/dL — ABNORMAL LOW (ref 8.9–10.3)
Chloride: 101 mmol/L (ref 98–111)
Creatinine, Ser: 1.15 mg/dL (ref 0.61–1.24)
GFR calc non Af Amer: >60 mL/min (ref >60)
Glucose, Bld: 134 mg/dL — ABNORMAL HIGH (ref 70–99)
Potassium: 3.9 mmol/L (ref 3.5–5.1)
Sodium: 142 mmol/L (ref 135–145)

## 2018-04-01 NOTE — Progress Notes (Signed)
[facility] PROGRESS NOTE  Douglas Reeves VUY:[id number] DOB: [birth year] DOA: 03/29/2018 PCP: Marletta Lor, MD   Narrative: 57 yo ? DM ty II Prior polpys sp polypectomy + diverticulosis Body mass index is 36.98 kg/m.  Prior surgeries include Back surgery for scoliosis at a young age and appendectomy  Here with SBO Surgery has seen Patient undergoing SBO protocol with Barium   A & Plan Small bowel obstruction-Gastrografin performed 12/18 now passing flatus and has large drainage-NGn tube pulled-defer to surgery furthe rplans and diet Diabetes mellitus type 2-blood sugars ranging 120's-continue sensitive sliding scale coverage as he is n.p.o. Home meds include Amaryl 4 mg metformin 1000 twice daily Actos 45 Jardiance 25 Trulicity all on hold Hyperlipidemia holding Lipitor for now Moderate obesity BMI 36    DVT prophylaxis: Lovenox code Status: Full code family Communication: Discussed with wife disposition Plan: Await resolution of SBO    [name], MD  [facility] Direct contact: [phone] --Via amion app OR  --[email or web address]; password [email or web address]  7PM-7AM contact night coverage as above 04/01/2018, 12:14 PM  LOS: 2 days   Consultants:  General surgery  Procedures:  None  Antimicrobials:  No  Interval history/Subjective:  Well ++flatus No stool tol clears  Objective:  Vitals:  Vitals:   04/01/18 0825 04/01/18 1134  BP: (!) 147/78 (!) 149/85  Pulse: 96 (!) 108  Resp: (!) 22 20  Temp: 98.6 F (37 C) 98.1 F (36.7 C)  SpO2: 97% 94%    Exam:  EOMI NCAT no pallor no icterus S1-S2 no murmur Abdomen is obese, less distended, Bs heard Neuro intat No ic tno pallor    I have personally reviewed the following:  DATA NG out AXR reviewe dshows improving gas pattenr in lower bowels Sugars ranging as above  Scheduled Meds: . insulin aspart  0-9 Units Subcutaneous Q4H   Continuous Infusions:  Principal Problem:   SBO (small bowel  obstruction) (HCC) Active Problems:   Diabetes mellitus without complication (HCC)   ARF (acute renal failure) (HCC)   LOS: 2 days

## 2018-04-01 NOTE — Progress Notes (Signed)
Patient ID: Douglas Reeves, male   DOB: [birth year], 57 y.o.   MRN: [id number]       Subjective: Patient feels much better.  No abdominal pain.  Passing flatus, but NGT output has still been very high. Patient states he is eating a lot of ice.  Objective: Vital signs in last 24 hours: Temp:  [98 F (36.7 C)-99.7 F (37.6 C)] 98.6 F (37 C) (12/20 0825) Pulse Rate:  [69-115] 96 (12/20 0825) Resp:  [14-25] 22 (12/20 0825) BP: (138-151)/(67-82) 147/78 (12/20 0825) SpO2:  [91 %-98 %] 97 % (12/20 0825) Weight:  [782 kg] 124 kg (12/20 0500) Last BM Date: 03/29/18  Intake/Output from previous day: 12/19 0701 - 12/20 0700 In: -  Out: 2250 [Emesis/NG output:2250] Intake/Output this shift: No intake/output data recorded.  PE: Heart: regular Lungs: CTAB Abd: soft, NT, ND, +BS, NGT with some old bloody looking NGT output, thin.  Lab Results:  Recent Labs    03/30/18 0217 04/01/18 0442  WBC 11.6* 9.3  HGB 14.9 15.1  HCT 47.3 47.6  PLT 279 285   BMET Recent Labs    03/30/18 0217 04/01/18 0442  NA 139 142  K 4.6 3.9  CL 102 101  CO2 22 24  GLUCOSE 168* 134*  BUN 11 27*  CREATININE 1.23 1.15  CALCIUM 9.2 8.7*   PT/INR No results for input(s): LABPROT, INR in the last 72 hours. CMP     Component Value Date/Time   NA 142 04/01/2018 0442   K 3.9 04/01/2018 0442   CL 101 04/01/2018 0442   CO2 24 04/01/2018 0442   GLUCOSE 134 (H) 04/01/2018 0442   GLUCOSE 177 (H) 03/08/2006 0807   BUN 27 (H) 04/01/2018 0442   CREATININE 1.15 04/01/2018 0442   CREATININE 0.99 10/04/2015 1717   CALCIUM 8.7 (L) 04/01/2018 0442   PROT 8.3 (H) 03/29/2018 2213   ALBUMIN 4.5 03/29/2018 2213   AST 26 03/29/2018 2213   ALT 29 03/29/2018 2213   ALKPHOS 52 03/29/2018 2213   BILITOT 1.0 03/29/2018 2213   GFRNONAA >60 04/01/2018 0442   GFRAA >60 04/01/2018 0442   Lipase     Component Value Date/Time   LIPASE 22 03/29/2018 2213       Studies/Results: Dg Abd Portable 1v  Result  Date: 03/31/2018 CLINICAL DATA:  Bowel obstruction EXAM: PORTABLE ABDOMEN - 1 VIEW COMPARISON:  03/30/2018 FINDINGS: Mildly dilated small bowel loops again noted, unchanged since prior study. Gas and oral contrast material noted within decompressed colon. NG tube is in the proximal to mid stomach. IMPRESSION: Continued mildly dilated gas-filled small bowel loops compatible with small bowel obstruction, partial. Oral contrast material is noted within the colon. Electronically Signed   By: Rolm Baptise M.D.   On: 03/31/2018 11:23   Dg Abd Portable 1v-small Bowel Obstruction Protocol-initial, 8 Hr Delay  Result Date: 03/30/2018 CLINICAL DATA:  Small bowel obstruction. EXAM: PORTABLE ABDOMEN - 1 VIEW COMPARISON:  CT 03/29/2017 FINDINGS: AP supine view of the abdomen was obtained. Dilated loops of small bowel that appear to contain dilute oral contrast or fluid. No significant gas in the rectum. Small amount of gas in the right colon. IMPRESSION: Persistent dilated loops of small bowel. Findings remain compatible with a small bowel obstruction. Electronically Signed   By: Markus Daft M.D.   On: 03/30/2018 21:21    Anti-infectives: Anti-infectives (From admission, onward)   None       Assessment/Plan Principal Problem:   SBO (small bowel obstruction) (  [place]) Active Problems:   Diabetes mellitus without complication ([place])   ARF (acute renal failure) ([facility])  SBO - likely 2/2 adhesions from open appendectomy - still with a lot of NGT output, but he has been eating several cups of ice. -repeat film today shows improvement and contrast throughout colon and decrease in small bowel dilatation. -DC NGT and give clear liquids  FEN: CLD  VTE: SCD's, chem DVT per medicine ID: no abx     LOS: 2 days    Henreitta Cea , [facility] 04/01/2018, 10:16 AM Pager: [phone]

## 2018-04-02 LAB — GLUCOSE, CAPILLARY
GLUCOSE-CAPILLARY: 214 mg/dL — AB (ref 70–99)
Glucose-Capillary: 172 mg/dL — ABNORMAL HIGH (ref 70–99)
Glucose-Capillary: 200 mg/dL — ABNORMAL HIGH (ref 70–99)

## 2018-04-02 NOTE — Discharge Summary (Signed)
Physician Discharge Summary  Douglas Reeves ALP:[id number] DOB: 02-Apr-1961 DOA: 03/29/2018  PCP: Marletta Lor, MD  Admit date: 03/29/2018 Discharge date: 04/02/2018  Time spent: 30 minutes  Recommendations for Outpatient Follow-up:  1. Needs outpatient follow-up for his diabetes  Discharge Diagnoses:  Principal Problem:   SBO (small bowel obstruction) ([facility]) Active Problems:   Diabetes mellitus without complication ([place])   ARF (acute renal failure) ([place])   Discharge Condition: Improved  Diet recommendation: Diabetic heart healthy  Filed Weights   03/31/18 0432 04/01/18 0500 04/02/18 0600  Weight: 128.6 kg 124 kg 128.2 kg    History of present illness:  57 yo? DM ty II Prior polpys sp polypectomy + diverticulosis Body mass index is 36.98 kg/m. Prior surgeries include Back surgery for scoliosis at a young age and appendectomy  Here with SBO Surgery has seen Patient undergoing SBO protocol with Barium   A & Plan Small bowel obstruction-Gastrografin performed 12/18 now passing flatus and has large drainage-NG was discontinued on 12/20 and as he was passing stool and eating and drinking he was cleared for discharge by surgery--at his wife's request I have given letters excusing him from work Diabetes mellitus type 2-blood sugars ranging 120's-continue sensitive sliding scale coverage as he is n.p.o. Home meds include Amaryl 4 mg metformin 1000 twice daily Actos 45 Jardiance 25 Trulicity resumed on discharge Hyperlipidemia Lipitor resumed on discharge Moderate obesity BMI 36    Procedures:  Gastrografin   Consultations:  General surgery  Discharge Exam: Vitals:   04/02/18 0300 04/02/18 0859  BP: 134/76 123/75  Pulse: 81 85  Resp: 20 20  Temp: 98.3 F (36.8 C) 99 F (37.2 C)  SpO2: 97% 97%    General: Awake alert pleasant no distress Cardiovascular: S1-S2 no murmur rub or gallop Respiratory: Clear Abdomen soft nontender  Discharge  Instructions   Discharge Instructions    Diet - low sodium heart healthy   Complete by:  As directed    Discharge instructions   Complete by:  As directed    Follow with primary MD for adjustment of meds   Increase activity slowly   Complete by:  As directed      Allergies as of 04/02/2018   No Known Allergies     Medication List    TAKE these medications   atorvastatin 20 MG tablet Commonly known as:  LIPITOR Take 20 mg by mouth at bedtime.   Dulaglutide 1.5 MG/0.5ML Sopn Commonly known as:  TRULICITY INJECT 1.5 MG SUBCUTANEOUSLY ONCE A WEEK What changed:    how much to take  how to take this  when to take this  additional instructions   empagliflozin 25 MG Tabs tablet Commonly known as:  JARDIANCE Take 25 mg by mouth daily.   glimepiride 4 MG tablet Commonly known as:  AMARYL TAKE ONE TABLET BY MOUTH ONCE DAILY WITH BREAKFAST What changed:    how much to take  how to take this  when to take this  additional instructions   metFORMIN 1000 MG tablet Commonly known as:  GLUCOPHAGE TAKE 1 TABLET BY MOUTH TWICE DAILY WITH  A  MEAL What changed:    how much to take  how to take this  when to take this  additional instructions   pioglitazone 45 MG tablet Commonly known as:  ACTOS Take 1 tablet (45 mg total) by mouth daily.      No Known Allergies    The results of significant diagnostics from this hospitalization (including  imaging, microbiology, ancillary and laboratory) are listed below for reference.    Significant Diagnostic Studies: Dg Abd 1 View  Result Date: 03/29/2018 CLINICAL DATA:  Abdominal pain. Nausea and vomiting. Abdominal distension. Evaluation at urgent care this morning, progressive pain since that time. EXAM: ABDOMEN - 1 VIEW COMPARISON:  None. FINDINGS: Upright view of the abdomen. No evidence of free air, right hemidiaphragm not entirely included in the field of view. Scattered air-fluid levels throughout bowel loops in  the central abdomen with generalized paucity of bowel gas. Small volume of stool in the ascending and splenic flexure of the colon. Pelvic calcifications likely phleboliths. IMPRESSION: Upright view of the abdomen demonstrating no evidence of free air. CT is in progress for evaluation of air-fluid levels within small bowel in the central abdomen (images not yet available). Electronically Signed   By: Keith Rake M.D.   On: 03/29/2018 23:30   Ct Abdomen Pelvis W Contrast  Result Date: 03/29/2018 CLINICAL DATA:  Abdominal pain with nausea and vomiting EXAM: CT ABDOMEN AND PELVIS WITH CONTRAST TECHNIQUE: Multidetector CT imaging of the abdomen and pelvis was performed using the standard protocol following bolus administration of intravenous contrast. CONTRAST:  136mL OMNIPAQUE IOHEXOL 300 MG/ML  SOLN COMPARISON:  CT abdomen pelvis 09/06/2014 FINDINGS: LOWER CHEST: There is no basilar pleural or apical pericardial effusion. HEPATOBILIARY: Diffuse hypoattenuation of the liver relative to the spleen suggests hepatic steatosis. No focal liver lesion or biliary dilatation. The gallbladder is normal. PANCREAS: The pancreatic parenchymal contours are normal and there is no ductal dilatation. There is no peripancreatic fluid collection. SPLEEN: Normal. ADRENALS/URINARY TRACT: --Adrenal glands: Normal. --Right kidney/ureter: No hydronephrosis, nephroureterolithiasis, perinephric stranding or solid renal mass. --Left kidney/ureter: No hydronephrosis, nephroureterolithiasis, perinephric stranding or solid renal mass. --Urinary bladder: Normal for degree of distention STOMACH/BOWEL: --Stomach/Duodenum: There is no hiatal hernia or other gastric abnormality. The duodenal course and caliber are normal. --Small bowel: There is diffuse dilatation of the proximal small bowel with a transition point in the mid abdomen (series 3, image 61). The remainder of the small bowel is decompressed. No free intraperitoneal air. No  inflammatory change. --Colon: No focal abnormality. --Appendix: Surgically absent. VASCULAR/LYMPHATIC: Normal course and caliber of the major abdominal vessels. No abdominal or pelvic lymphadenopathy. REPRODUCTIVE: Normal prostate size with symmetric seminal vesicles. MUSCULOSKELETAL. No bony spinal canal stenosis or focal osseous abnormality. OTHER: None. IMPRESSION: 1. Small bowel obstruction with transition point in the mid abdomen. 2. Hepatic steatosis. Electronically Signed   By: Ulyses Jarred M.D.   On: 03/29/2018 23:54   Dg Abd Portable 1v  Result Date: 04/01/2018 CLINICAL DATA:  Small bowel obstruction EXAM: PORTABLE ABDOMEN - 1 VIEW COMPARISON:  CT abdomen and pelvis March 29, 2018; abdominal radiograph March 31, 2018 FINDINGS: Contrast is present in the colon and rectum. There is no longer appreciable bowel dilatation. No air-fluid levels are noted. No free air. Note that nasogastric tube tip and side port are in the stomach. There are phleboliths in the pelvis. IMPRESSION: Nasogastric tube tip and side port in stomach. No longer bowel dilatation. Contrast noted in colon and rectum. Findings indicative of resolution of bowel obstruction. No free air demonstrable. Electronically Signed   By: Lowella Grip III M.D.   On: 04/01/2018 10:17   Dg Abd Portable 1v  Result Date: 03/31/2018 CLINICAL DATA:  Bowel obstruction EXAM: PORTABLE ABDOMEN - 1 VIEW COMPARISON:  03/30/2018 FINDINGS: Mildly dilated small bowel loops again noted, unchanged since prior study. Gas and oral contrast material  noted within decompressed colon. NG tube is in the proximal to mid stomach. IMPRESSION: Continued mildly dilated gas-filled small bowel loops compatible with small bowel obstruction, partial. Oral contrast material is noted within the colon. Electronically Signed   By: Rolm Baptise M.D.   On: 03/31/2018 11:23   Dg Abd Portable 1v-small Bowel Obstruction Protocol-initial, 8 Hr Delay  Result Date:  03/30/2018 CLINICAL DATA:  Small bowel obstruction. EXAM: PORTABLE ABDOMEN - 1 VIEW COMPARISON:  CT 03/29/2017 FINDINGS: AP supine view of the abdomen was obtained. Dilated loops of small bowel that appear to contain dilute oral contrast or fluid. No significant gas in the rectum. Small amount of gas in the right colon. IMPRESSION: Persistent dilated loops of small bowel. Findings remain compatible with a small bowel obstruction. Electronically Signed   By: Markus Daft M.D.   On: 03/30/2018 21:21   Dg Abd Portable 1 View  Result Date: 03/30/2018 CLINICAL DATA:  NG tube was advanced. EXAM: PORTABLE ABDOMEN - 1 VIEW COMPARISON:  Radiograph earlier this day at 0146 hour FINDINGS: The side-port of the enteric tube is now below the diaphragm in the stomach. Dilated small bowel on prior exam is not as well seen currently. IMPRESSION: Enteric tube appropriately positioned in the stomach. Electronically Signed   By: Keith Rake M.D.   On: 03/30/2018 02:28   Dg Abd Portable 1 View  Result Date: 03/30/2018 CLINICAL DATA:  Orogastric tube placement EXAM: PORTABLE ABDOMEN - 1 VIEW COMPARISON:  None. FINDINGS: Orogastric tube side port is just below the level of the gastroesophageal junction. Recommend advancing by 5 cm. The tip projects over the gastric body. Lung bases are clear. IMPRESSION: Recommend advancing orogastric tube by 5 cm. Electronically Signed   By: Ulyses Jarred M.D.   On: 03/30/2018 02:02    Microbiology: Recent Results (from the past 240 hour(s))  MRSA PCR Screening     Status: None   Collection Time: 03/30/18  3:10 AM  Result Value Ref Range Status   MRSA by PCR NEGATIVE NEGATIVE Final    Comment:        The GeneXpert MRSA Assay (FDA approved for NASAL specimens only), is one component of a comprehensive MRSA colonization surveillance program. It is not intended to diagnose MRSA infection nor to guide or monitor treatment for MRSA infections. Performed at [facility], [place] [address], [place], [place] [address]      Labs: Basic Metabolic Panel: Recent Labs  Lab 03/29/18 2213 03/30/18 0217 04/01/18 0442  NA 139 139 142  K 5.0 4.6 3.9  CL 94* 102 101  CO2 26 22 24   GLUCOSE 214* 168* 134*  BUN 11 11 27*  CREATININE 1.31* 1.23 1.15  CALCIUM 10.4* 9.2 8.7*   Liver Function Tests: Recent Labs  Lab 03/29/18 2213  AST 26  ALT 29  ALKPHOS 52  BILITOT 1.0  PROT 8.3*  ALBUMIN 4.5   Recent Labs  Lab 03/29/18 2213  LIPASE 22   No results for input(s): AMMONIA in the last 168 hours. CBC: Recent Labs  Lab 03/29/18 2213 03/30/18 0217 04/01/18 0442  WBC 13.1* 11.6* 9.3  NEUTROABS  --   --  6.8  HGB 16.2 14.9 15.1  HCT 50.3 47.3 47.6  MCV 91.5 91.8 91.4  PLT 352 279 285   Cardiac Enzymes: No results for input(s): CKTOTAL, CKMB, CKMBINDEX, TROPONINI in the last 168 hours. BNP: BNP (last 3 results) No results for input(s): BNP in the last 8760  hours.  ProBNP (last 3 results) No results for input(s): PROBNP in the last 8760 hours.  CBG: Recent Labs  Lab 04/01/18 1613 04/01/18 2008 04/01/18 2310 04/02/18 0404 04/02/18 0856  GLUCAP 156* 181* 167* 172* 200*       Signed:  Nita Sells MD   [facility] 04/02/2018, 10:41 AM

## 2018-04-02 NOTE — Discharge Instructions (Signed)
Bowel Obstruction °A bowel obstruction means that something is blocking the small or large bowel. The bowel is also called the intestine. It is the long tube that connects the stomach to the opening of the butt (anus). When something blocks the bowel, food and fluids cannot pass through like normal. This condition needs to be treated. Treatment depends on the cause of the problem and how bad the problem is. °What are the causes? °Common causes of this condition include: °· Scar tissue (adhesions) from past surgery or from high-energy X-rays (radiation). °· Recent surgery in the belly. This affects how food moves in the bowel. °· Some diseases, such as: °? Irritation of the lining of the digestive tract (Crohn's disease). °? Irritation of small pouches in the bowel (diverticulitis). °· Growths or tumors. °· A bulging organ (hernia). °· Twisting of the bowel (volvulus). °· A foreign body. °· Slipping of a part of the bowel into another part (intussusception). °What are the signs or symptoms? °Symptoms of this condition include: °· Pain in the belly. °· Feeling sick to your stomach (nauseous). °· Throwing up (vomiting). °· Bloating in the belly. °· Being unable to pass gas. °· Trouble pooping (constipation). °· Watery poop (diarrhea). °· A lot of belching. °How is this diagnosed? °This condition may be diagnosed based on: °· A physical exam. °· Medical history. °· Imaging tests, such as X-ray or CT scan. °· Blood tests. °· Urine tests. °How is this treated? °Treatment for this condition may include: °· Fluids and pain medicines that are given through an IV tube. Your doctor may tell you not to eat or drink if you feel sick to your stomach and are throwing up. °· Eating a clear liquid diet for a few days. °· Putting a small tube (nasogastric tube) into the stomach. This will help with pain, discomfort, and nausea by removing blocked air and fluids from the stomach. °· Surgery. This may be needed if other treatments do  not work. °Follow these instructions at home: °Medicines °· Take over-the-counter and prescription medicines only as told by your doctor. °· If you were prescribed an antibiotic medicine, take it as told by your doctor. Do not stop taking the antibiotic even if you start to feel better. °General instructions °· Follow your diet as told by your doctor. You may need to: °? Only drink clear liquids until you start to get better. °? Avoid solid foods. °· Return to your normal activities as told by your doctor. Ask your doctor what activities are safe for you. °· Do not sit for a long time without moving. Get up to take short walks every 1-2 hours. This is important. Ask for help if you feel weak or unsteady. °· Keep all follow-up visits as told by your doctor. This is important. °How is this prevented? °After having a bowel obstruction, you may be more likely to have another. You can do some things to stop it from happening again. °· If you have a long-term (chronic) disease, contact your doctor if you see changes or problems. °· Take steps to prevent or treat trouble pooping. Your doctor may ask that you: °? Drink enough fluid to keep your pee (urine) pale yellow. °? Take over-the-counter or prescription medicines. °? Eat foods that are high in fiber. These include beans, whole grains, and fresh fruits and vegetables. °? Limit foods that are high in fat and sugar. These include fried or sweet foods. °· Stay active. Ask your doctor which exercises are   safe for you. °· Avoid stress. °· Eat three small meals and three small snacks each day. °· Work with a food expert (dietitian) to make a meal plan that works for you. °· Do not use any products that contain nicotine or tobacco, such as cigarettes and e-cigarettes. If you need help quitting, ask your doctor. °Contact a doctor if: °· You have a fever. °· You have chills. °Get help right away if: °· You have pain or cramps that get worse. °· You throw up blood. °· You are  sick to your stomach. °· You cannot stop throwing up. °· You cannot drink fluids. °· You feel mixed up (confused). °· You feel very thirsty (dehydrated). °· Your belly gets more bloated. °· You feel weak or you pass out (faint). °Summary °· A bowel obstruction means that something is blocking the small or large bowel. °· Treatment may include IV fluids and pain medicine. You may also have a clear liquid diet, a small tube in your stomach, or surgery. °· Drink clear liquids and avoid solid foods until you get better. °This information is not intended to replace advice given to you by your health care provider. Make sure you discuss any questions you have with your health care provider. °Document Released: 05/07/2004 Document Revised: 08/11/2017 Document Reviewed: 08/11/2017 °Elsevier Interactive Patient Education © 2019 [employer] ° °

## 2018-04-02 NOTE — Progress Notes (Signed)
Patient sitting up in chair, looks great.  Abdomen is soft, not tender, passing flatus and having bowel movements.  Okay to go home from surgical standpoint.  Kathryne Eriksson. Dahlia Bailiff, MD, [place] [phone] [facility]

## 2018-04-02 NOTE — Progress Notes (Signed)
Nsg Discharge Note  Admit Date:  03/29/2018 Discharge date: 04/02/2018   Douglas Reeves to be D/C'd to go home per MD order.  AVS completed. Patient/caregiver able to verbalize understanding.  Discharge Medication: Allergies as of 04/02/2018   No Known Allergies     Medication List    TAKE these medications   atorvastatin 20 MG tablet Commonly known as:  LIPITOR Take 20 mg by mouth at bedtime.   Dulaglutide 1.5 MG/0.5ML Sopn Commonly known as:  TRULICITY INJECT 1.5 MG SUBCUTANEOUSLY ONCE A WEEK What changed:    how much to take  how to take this  when to take this  additional instructions Notes to patient:  ONCE A WEEK ON SUNDAYS   empagliflozin 25 MG Tabs tablet Commonly known as:  JARDIANCE Take 25 mg by mouth daily.   glimepiride 4 MG tablet Commonly known as:  AMARYL TAKE ONE TABLET BY MOUTH ONCE DAILY WITH BREAKFAST What changed:    how much to take  how to take this  when to take this  additional instructions   metFORMIN 1000 MG tablet Commonly known as:  GLUCOPHAGE TAKE 1 TABLET BY MOUTH TWICE DAILY WITH  A  MEAL What changed:    how much to take  how to take this  when to take this  additional instructions   pioglitazone 45 MG tablet Commonly known as:  ACTOS Take 1 tablet (45 mg total) by mouth daily.       Discharge Assessment: Vitals:   04/02/18 0900 04/02/18 1155  BP: 123/75 133/70  Pulse: 86 84  Resp: (!) 22 20  Temp:  98.9 F (37.2 C)  SpO2: 99% 97%   Skin clean, dry and intact without evidence of skin break down, no evidence of skin tears noted. IV catheter discontinued intact. Site without signs and symptoms of complications - no redness or edema noted at insertion site, patient denies c/o pain - only slight tenderness at site.  Dressing with slight pressure applied.  D/c Instructions-Education: Discharge instructions given to patient/family with verbalized understanding. D/c education completed with patient/family  including follow up instructions, medication list, d/c activities limitations if indicated, with other d/c instructions as indicated by MD - patient able to verbalize understanding, all questions fully answered. Patient instructed to return to ED, call 911, or call MD for any changes in condition.  Patient escorted via [place], and D/C home via private auto.  [place]

## 2018-04-04 ENCOUNTER — Telehealth: Payer: Self-pay | Admitting: *Deleted

## 2018-04-04 NOTE — Telephone Encounter (Signed)
TCM  Attempted to call patient

## 2018-04-05 NOTE — Telephone Encounter (Signed)
TCM Left message on machine for patient to return our call 2nd attempt

## 2018-11-28 ENCOUNTER — Telehealth: Payer: Self-pay | Admitting: Family Medicine

## 2018-11-28 ENCOUNTER — Encounter: Payer: 59 | Admitting: Family Medicine

## 2018-11-28 NOTE — Telephone Encounter (Signed)

## 2018-11-29 ENCOUNTER — Ambulatory Visit: Payer: 59 | Admitting: Family Medicine

## 2018-11-29 ENCOUNTER — Encounter: Payer: Self-pay | Admitting: Family Medicine

## 2018-11-29 VITALS — BP 140/70 | HR 90 | Temp 96.6°F | Ht 74.0 in | Wt 285.0 lb

## 2018-11-29 DIAGNOSIS — E119 Type 2 diabetes mellitus without complications: Secondary | ICD-10-CM | POA: Diagnosis not present

## 2018-11-29 DIAGNOSIS — E785 Hyperlipidemia, unspecified: Secondary | ICD-10-CM

## 2018-11-29 DIAGNOSIS — E1169 Type 2 diabetes mellitus with other specified complication: Secondary | ICD-10-CM

## 2018-11-29 DIAGNOSIS — Z23 Encounter for immunization: Secondary | ICD-10-CM | POA: Diagnosis not present

## 2018-11-29 MED ORDER — TRULICITY 1.5 MG/0.5ML ~~LOC~~ SOAJ: SUBCUTANEOUS | 2 refills | Status: DC

## 2018-11-29 NOTE — Patient Instructions (Signed)
Your blood pressure is borderline high today.  Recommend a low salt diet.    Diabetes Mellitus and Nutrition, Adult When you have diabetes (diabetes mellitus), it is very important to have healthy eating habits because your blood sugar (glucose) levels are greatly affected by what you eat and drink. Eating healthy foods in the appropriate amounts, at about the same times every day, can help you:  Control your blood glucose.  Lower your risk of heart disease.  Improve your blood pressure.  Reach or maintain a healthy weight. Every person with diabetes is different, and each person has different needs for a meal plan. Your health care provider may recommend that you work with a diet and nutrition specialist (dietitian) to make a meal plan that is best for you. Your meal plan may vary depending on factors such as:  The calories you need.  The medicines you take.  Your weight.  Your blood glucose, blood pressure, and cholesterol levels.  Your activity level.  Other health conditions you have, such as heart or kidney disease. How do carbohydrates affect me? Carbohydrates, also called carbs, affect your blood glucose level more than any other type of food. Eating carbs naturally raises the amount of glucose in your blood. Carb counting is a method for keeping track of how many carbs you eat. Counting carbs is important to keep your blood glucose at a healthy level, especially if you use insulin or take certain oral diabetes medicines. It is important to know how many carbs you can safely have in each meal. This is different for every person. Your dietitian can help you calculate how many carbs you should have at each meal and for each snack. Foods that contain carbs include:  Bread, cereal, rice, pasta, and crackers.  Potatoes and corn.  Peas, beans, and lentils.  Milk and yogurt.  Fruit and juice.  Desserts, such as cakes, cookies, ice cream, and candy. How does alcohol affect  me? Alcohol can cause a sudden decrease in blood glucose (hypoglycemia), especially if you use insulin or take certain oral diabetes medicines. Hypoglycemia can be a life-threatening condition. Symptoms of hypoglycemia (sleepiness, dizziness, and confusion) are similar to symptoms of having too much alcohol. If your health care provider says that alcohol is safe for you, follow these guidelines:  Limit alcohol intake to no more than 1 drink per day for nonpregnant women and 2 drinks per day for men. One drink equals 12 oz of beer, 5 oz of wine, or 1 oz of hard liquor.  Do not drink on an empty stomach.  Keep yourself hydrated with water, diet soda, or unsweetened iced tea.  Keep in mind that regular soda, juice, and other mixers may contain a lot of sugar and must be counted as carbs. What are tips for following this plan?  Reading food labels  Start by checking the serving size on the "Nutrition Facts" label of packaged foods and drinks. The amount of calories, carbs, fats, and other nutrients listed on the label is based on one serving of the item. Many items contain more than one serving per package.  Check the total grams (g) of carbs in one serving. You can calculate the number of servings of carbs in one serving by dividing the total carbs by 15. For example, if a food has 30 g of total carbs, it would be equal to 2 servings of carbs.  Check the number of grams (g) of saturated and trans fats in one serving.  Choose foods that have low or no amount of these fats.  Check the number of milligrams (mg) of salt (sodium) in one serving. Most people should limit total sodium intake to less than 2,300 mg per day.  Always check the nutrition information of foods labeled as "low-fat" or "nonfat". These foods may be higher in added sugar or refined carbs and should be avoided.  Talk to your dietitian to identify your daily goals for nutrients listed on the label. Shopping  Avoid buying  canned, premade, or processed foods. These foods tend to be high in fat, sodium, and added sugar.  Shop around the outside edge of the grocery store. This includes fresh fruits and vegetables, bulk grains, fresh meats, and fresh dairy. Cooking  Use low-heat cooking methods, such as baking, instead of high-heat cooking methods like deep frying.  Cook using healthy oils, such as olive, canola, or sunflower oil.  Avoid cooking with butter, cream, or high-fat meats. Meal planning  Eat meals and snacks regularly, preferably at the same times every day. Avoid going long periods of time without eating.  Eat foods high in fiber, such as fresh fruits, vegetables, beans, and whole grains. Talk to your dietitian about how many servings of carbs you can eat at each meal.  Eat 4-6 ounces (oz) of lean protein each day, such as lean meat, chicken, fish, eggs, or tofu. One oz of lean protein is equal to: ? 1 oz of meat, chicken, or fish. ? 1 egg. ?  cup of tofu.  Eat some foods each day that contain healthy fats, such as avocado, nuts, seeds, and fish. Lifestyle  Check your blood glucose regularly.  Exercise regularly as told by your health care provider. This may include: ? 150 minutes of moderate-intensity or vigorous-intensity exercise each week. This could be brisk walking, biking, or water aerobics. ? Stretching and doing strength exercises, such as yoga or weightlifting, at least 2 times a week.  Take medicines as told by your health care provider.  Do not use any products that contain nicotine or tobacco, such as cigarettes and e-cigarettes. If you need help quitting, ask your health care provider.  Work with a Social worker or diabetes educator to identify strategies to manage stress and any emotional and social challenges. Questions to ask a health care provider  Do I need to meet with a diabetes educator?  Do I need to meet with a dietitian?  What number can I call if I have  questions?  When are the best times to check my blood glucose? Where to find more information:  American Diabetes Association: diabetes.org  Academy of Nutrition and Dietetics: www.eatright.[employer] of Diabetes and Digestive and Kidney Diseases (NIH): [employer] Summary  A healthy meal plan will help you control your blood glucose and maintain a healthy lifestyle.  Working with a diet and nutrition specialist (dietitian) can help you make a meal plan that is best for you.  Keep in mind that carbohydrates (carbs) and alcohol have immediate effects on your blood glucose levels. It is important to count carbs and to use alcohol carefully. This information is not intended to replace advice given to you by your health care provider. Make sure you discuss any questions you have with your health care provider. Document Released: 12/25/2004 Document Revised: 03/12/2017 Document Reviewed: 05/04/2016 Elsevier Patient Education  2020 [employer].

## 2018-11-29 NOTE — Assessment & Plan Note (Signed)
-  Doing well with current medications.  Trulicity refilled.  -Lab work updated today, will contact with results and any adjustments to medications.  -Follow low carb diet with regular exercise.

## 2018-11-29 NOTE — Assessment & Plan Note (Signed)
Update lipid panel today. Continue atorvastatin.

## 2018-11-29 NOTE — Progress Notes (Signed)
Douglas Reeves - 58 y.o. male MRN [id number]  Date of birth: 15-Oct-1960  Subjective Chief Complaint  Patient presents with  . Establish Care    TOC/ med refill trulicity    HPI Douglas Reeves is a 58 y.o. male wit history of T2DM and HLD here today for initial visit and follow up of diabetes. He is currently managing his diabetes with metformin, amaryl, jardiance, actos and trulicity.  He reports that blood sugars at home are around 130-150.  He denies any symptoms of hypoglycemia.  His job keeps him fairly active.  He tries to follow a healthy diet but admits he could continue to make improvements.    He is also taking atorvastatin for elevated cholesterol.  He is tolerating this and denies myalgias.    ROS:  A comprehensive ROS was completed and negative except as noted per HPI  No Known Allergies  Past Medical History:  Diagnosis Date  . Diabetes mellitus     Past Surgical History:  Procedure Laterality Date  . APPENDECTOMY  1972  . [place]  . VASECTOMY  2000    Social History   Socioeconomic History  . Marital status: Married    Spouse name: Not on file  . Number of children: Not on file  . Years of education: Not on file  . Highest education level: Not on file  Occupational History  . Not on file  Social Needs  . Financial resource strain: Not on file  . Food insecurity    Worry: Not on file    Inability: Not on file  . Transportation needs    Medical: Not on file    Non-medical: Not on file  Tobacco Use  . Smoking status: Never Smoker  . Smokeless tobacco: Never Used  Substance and Sexual Activity  . Alcohol use: No    Alcohol/week: 0.0 standard drinks  . Drug use: No  . Sexual activity: Not on file  Lifestyle  . Physical activity    Days per week: Not on file    Minutes per session: Not on file  . Stress: Not on file  Relationships  . Social Herbalist on phone: Not on file    Gets together: Not on file    Attends religious  service: Not on file    Active member of club or organization: Not on file    Attends meetings of clubs or organizations: Not on file    Relationship status: Not on file  Other Topics Concern  . Not on file  Social History Narrative  . Not on file    Family History  Problem Relation Age of Onset  . Colon cancer Neg Hx     Health Maintenance  Topic Date Due  . Hepatitis C Screening  January 21, 1961  . PNEUMOCOCCAL POLYSACCHARIDE VACCINE AGE 32-64 HIGH RISK  05/14/1963  . OPHTHALMOLOGY EXAM  12/14/2010  . FOOT EXAM  12/11/2017  . URINE MICROALBUMIN  04/22/2018  . HEMOGLOBIN A1C  06/28/2018  . INFLUENZA VACCINE  11/12/2018  . TETANUS/TDAP  06/04/2021  . COLONOSCOPY  08/19/2024  . HIV Screening  Completed    ----------------------------------------------------------------------------------------------------------------------------------------------------------------------------------------------------------------- Physical Exam BP 140/70   Pulse 90   Temp (!) 96.6 F (35.9 C) (Temporal)   Ht 6\' 2"  (1.88 m)   Wt 285 lb (129.3 kg)   SpO2 96%   BMI 36.59 kg/m   Physical Exam Constitutional:      Appearance: Normal appearance.  HENT:  Head: Normocephalic and atraumatic.     Right Ear: Tympanic membrane normal.     Left Ear: Tympanic membrane normal.     Mouth/Throat:     Mouth: Mucous membranes are moist.  Neck:     Musculoskeletal: Neck supple.  Cardiovascular:     Rate and Rhythm: Normal rate and regular rhythm.  Skin:    General: Skin is warm and dry.  Neurological:     General: No focal deficit present.     Mental Status: He is alert.  Psychiatric:        Mood and Affect: Mood normal.        Behavior: Behavior normal.    Diabetic Foot Exam - Simple   Simple Foot Form Visual Inspection No deformities, no ulcerations, no other skin breakdown bilaterally: Yes Sensation Testing Intact to touch and monofilament testing bilaterally: Yes Pulse Check Posterior  Tibialis and Dorsalis pulse intact bilaterally: Yes Comments      ------------------------------------------------------------------------------------------------------------------------------------------------------------------------------------------------------------------- Assessment and Plan  Hyperlipidemia associated with type 2 diabetes mellitus (HCC) Update lipid panel today. Continue atorvastatin.   Diabetes mellitus without complication ([place]) -Doing well with current medications.  Trulicity refilled.  -Lab work updated today, will contact with results and any adjustments to medications.  -Follow low carb diet with regular exercise.

## 2018-11-30 LAB — COMPREHENSIVE METABOLIC PANEL
ALT: 17 U/L (ref 0–53)
AST: 15 U/L (ref 0–37)
Albumin: 4.2 g/dL (ref 3.5–5.2)
Alkaline Phosphatase: 70 U/L (ref 39–117)
BUN: 13 mg/dL (ref 6–23)
CO2: 25 mEq/L (ref 19–32)
Calcium: 9.2 mg/dL (ref 8.4–10.5)
Chloride: 101 mEq/L (ref 96–112)
Creatinine, Ser: 0.99 mg/dL (ref 0.40–1.50)
GFR: 94.1 mL/min (ref >60.00)
Glucose, Bld: 191 mg/dL — ABNORMAL HIGH (ref 70–99)
Potassium: 3.8 mEq/L (ref 3.5–5.1)
Sodium: 136 mEq/L (ref 135–145)
Total Bilirubin: 0.5 mg/dL (ref 0.2–1.2)
Total Protein: 7.2 g/dL (ref 6.0–8.3)

## 2018-11-30 LAB — LIPID PANEL
Cholesterol: 186 mg/dL (ref 0–200)
HDL: 44 mg/dL (ref >39.00)
NonHDL: 142.35
Total CHOL/HDL Ratio: 4
Triglycerides: 339 mg/dL — ABNORMAL HIGH (ref 0.0–149.0)
VLDL: 67.8 mg/dL — ABNORMAL HIGH (ref 0.0–40.0)

## 2018-11-30 LAB — MICROALBUMIN / CREATININE URINE RATIO
Creatinine,U: 93.2 mg/dL
Microalb Creat Ratio: 0.8 mg/g (ref 0.0–30.0)
Microalb, Ur: <0.7 mg/dL (ref 0.0–1.9)

## 2018-11-30 LAB — LDL CHOLESTEROL, DIRECT: Direct LDL: 99 mg/dL

## 2018-11-30 LAB — HEMOGLOBIN A1C: Hgb A1c MFr Bld: 7.9 % — ABNORMAL HIGH (ref 4.6–6.5)

## 2019-02-03 ENCOUNTER — Other Ambulatory Visit: Payer: Self-pay | Admitting: Family Medicine

## 2019-02-03 NOTE — Telephone Encounter (Signed)
Medication Refill - Medication: glimepiride (AMARYL) 4 MG tablet, metFORMIN (GLUCOPHAGE) 1000 MG tablet     empagliflozin (JARDIANCE) 25 MG TABS tablet     Preferred Pharmacy :  [facility] ([place] [place] - [address] [phone] (Phone) [phone] (Fax)     Pt was advised that RX refills may take up to 3 business days. We ask that you follow-up with your pharmacy.

## 2019-02-08 MED ORDER — JARDIANCE 25 MG PO TABS: 25.0000 mg | ORAL_TABLET | Freq: Every day | ORAL | 5 refills | Status: DC

## 2019-02-08 MED ORDER — GLIMEPIRIDE 4 MG PO TABS: ORAL_TABLET | ORAL | 11 refills | Status: DC

## 2019-02-08 MED ORDER — METFORMIN HCL 1000 MG PO TABS: ORAL_TABLET | ORAL | 4 refills | Status: DC

## 2019-02-08 NOTE — Telephone Encounter (Signed)
Requested medication (s) are due for refill today:yes  Requested medication (s) are on the active medication list: yes  Last refill:  12/29/2018  Future visit scheduled: no  Notes to clinic:  Patient calling again regarding medication refill Last filled by different provider   Requested Prescriptions  Pending Prescriptions Disp Refills   glimepiride (AMARYL) 4 MG tablet 90 tablet 11    Sig: TAKE ONE TABLET BY MOUTH ONCE DAILY WITH BREAKFAST     Endocrinology:  Diabetes - Sulfonylureas Passed - 02/08/2019 10:02 AM      Passed - HBA1C is between 0 and 7.9 and within 180 days    Hgb A1c MFr Bld  Date Value Ref Range Status  11/29/2018 7.9 (H) 4.6 - 6.5 % Final    Comment:    Glycemic Control Guidelines for People with Diabetes:Non Diabetic:  <6%Goal of Therapy: <7%Additional Action Suggested:  >8%          Passed - Valid encounter within last 6 months    Recent Outpatient Visits          2 months ago Diabetes mellitus without complication [facility])   [facility] [place], [place], DO   1 year ago Diabetes mellitus without complication ([place])   [facility]rap[place], [facility] at [facility], Doretha Sou, MD   1 year ago Diabetes mellitus without complication ([place])   [place] at [facility], Doretha Sou, MD   1 year ago Diabetes mellitus without complication ([place])   [place] at [facility], Doretha Sou, MD   2 years ago Diabetes mellitus without complication ([place])   [place] at [facility], Doretha Sou, MD      Future Appointments            In 3 weeks Luetta Nutting, DO [facility]            empagliflozin (JARDIANCE) 25 MG TABS tablet 90 tablet 5    Sig: Take 25 mg by mouth daily.     Endocrinology:  Diabetes - SGLT2 Inhibitors Failed - 02/08/2019 10:02 AM      Failed - LDL in normal range and within 360 days    LDL Cholesterol  Date Value Ref Range Status  04/22/2017 86 0 - 99  mg/dL Final         Passed - Cr in normal range and within 360 days    Creat  Date Value Ref Range Status  10/04/2015 0.99 0.70 - 1.33 mg/dL Final    Comment:      For patients > or = 57 years of age: The upper reference limit for Creatinine is approximately 13% higher for people identified as African-American.      Creatinine, Ser  Date Value Ref Range Status  11/29/2018 0.99 0.40 - 1.50 mg/dL Final         Passed - HBA1C is between 0 and 7.9 and within 180 days    Hgb A1c MFr Bld  Date Value Ref Range Status  11/29/2018 7.9 (H) 4.6 - 6.5 % Final    Comment:    Glycemic Control Guidelines for People with Diabetes:Non Diabetic:  <6%Goal of Therapy: <7%Additional Action Suggested:  >8%          Passed - eGFR in normal range and within 360 days    GFR calc Af Amer  Date Value Ref Range Status  04/01/2018 >60 >60 mL/min Final   GFR calc non Af Wyvonnia Lora  Date Value Ref Range Status  04/01/2018 >60 >60 mL/min Final   GFR  Date Value Ref Range Status  11/29/2018 94.10 >60.00 mL/min Final         Passed - Valid encounter within last 6 months    Recent Outpatient Visits          2 months ago Diabetes mellitus without complication [facility])   [facility] [place], [place], DO   1 year ago Diabetes mellitus without complication ([place])   [place] at [facility], Doretha Sou, MD   1 year ago Diabetes mellitus without complication ([place])   [place] [facility], Doretha Sou, MD   1 year ago Diabetes mellitus without complication ([place])   [place] at [facility], Doretha Sou, MD   2 years ago Diabetes mellitus without complication ([place])   [place] at [facility], Doretha Sou, MD      Future Appointments            In 3 weeks Luetta Nutting, DO [facility] [place], [facility]            metFORMIN (GLUCOPHAGE) 1000 MG tablet 180 tablet 4    Sig: TAKE 1 TABLET BY MOUTH TWICE DAILY  WITH  A  MEAL     Endocrinology:  Diabetes - Biguanides Passed - 02/08/2019 10:02 AM      Passed - Cr in normal range and within 360 days    Creat  Date Value Ref Range Status  10/04/2015 0.99 0.70 - 1.33 mg/dL Final    Comment:      For patients > or = 57 years of age: The upper reference limit for Creatinine is approximately 13% higher for people identified as African-American.      Creatinine, Ser  Date Value Ref Range Status  11/29/2018 0.99 0.40 - 1.50 mg/dL Final         Passed - HBA1C is between 0 and 7.9 and within 180 days    Hgb A1c MFr Bld  Date Value Ref Range Status  11/29/2018 7.9 (H) 4.6 - 6.5 % Final    Comment:    Glycemic Control Guidelines for People with Diabetes:Non Diabetic:  <6%Goal of Therapy: <7%Additional Action Suggested:  >8%          Passed - eGFR in normal range and within 360 days    GFR calc Af Amer  Date Value Ref Range Status  04/01/2018 >60 >60 mL/min Final   GFR calc non Af Amer  Date Value Ref Range Status  04/01/2018 >60 >60 mL/min Final   GFR  Date Value Ref Range Status  11/29/2018 94.10 >60.00 mL/min Final         Passed - Valid encounter within last 6 months    Recent Outpatient Visits          2 months ago Diabetes mellitus without complication [facility])   [facility] [place], [place], DO   1 year ago Diabetes mellitus without complication ([place])   [place] at [facility], Doretha Sou, MD   1 year ago Diabetes mellitus without complication ([place])   [place] [facility], Doretha Sou, MD   1 year ago Diabetes mellitus without complication ([place])   [place] at [facility], Doretha Sou, MD   2 years ago Diabetes mellitus without complication ([place])   [place] at [facility], Doretha Sou, MD      Future Appointments            In 3  weeks Luetta Nutting, DO [facility] [place], [place]

## 2019-02-08 NOTE — Telephone Encounter (Signed)
Pt calling and want to know why is  meds hasn't been refilled.

## 2019-03-02 ENCOUNTER — Telehealth: Payer: Self-pay

## 2019-03-02 NOTE — Telephone Encounter (Signed)

## 2019-03-03 ENCOUNTER — Ambulatory Visit: Payer: 59 | Admitting: Family Medicine

## 2019-03-03 ENCOUNTER — Encounter: Payer: Self-pay | Admitting: Family Medicine

## 2019-03-03 ENCOUNTER — Other Ambulatory Visit: Payer: Self-pay

## 2019-03-03 VITALS — BP 130/64 | HR 80 | Temp 97.1°F | Ht 74.0 in | Wt 278.0 lb

## 2019-03-03 DIAGNOSIS — Z23 Encounter for immunization: Secondary | ICD-10-CM

## 2019-03-03 DIAGNOSIS — E785 Hyperlipidemia, unspecified: Secondary | ICD-10-CM

## 2019-03-03 DIAGNOSIS — E119 Type 2 diabetes mellitus without complications: Secondary | ICD-10-CM | POA: Diagnosis not present

## 2019-03-03 DIAGNOSIS — E1169 Type 2 diabetes mellitus with other specified complication: Secondary | ICD-10-CM

## 2019-03-03 LAB — BASIC METABOLIC PANEL
BUN: 10 mg/dL (ref 6–23)
CO2: 30 mEq/L (ref 19–32)
Calcium: 9.2 mg/dL (ref 8.4–10.5)
Chloride: 104 mEq/L (ref 96–112)
Creatinine, Ser: 0.92 mg/dL (ref 0.40–1.50)
GFR: 102.31 mL/min (ref >60.00)
Glucose, Bld: 100 mg/dL — ABNORMAL HIGH (ref 70–99)
Potassium: 4.5 mEq/L (ref 3.5–5.1)
Sodium: 140 mEq/L (ref 135–145)

## 2019-03-03 LAB — HEMOGLOBIN A1C: Hgb A1c MFr Bld: 7.5 % — ABNORMAL HIGH (ref 4.6–6.5)

## 2019-03-03 MED ORDER — ATORVASTATIN CALCIUM 20 MG PO TABS: 20.0000 mg | ORAL_TABLET | Freq: Every day | ORAL | 2 refills | Status: DC

## 2019-03-03 MED ORDER — PIOGLITAZONE HCL 45 MG PO TABS: 45.0000 mg | ORAL_TABLET | Freq: Every day | ORAL | 3 refills | Status: DC

## 2019-03-03 NOTE — Addendum Note (Signed)
Addended by: Lynnea Ferrier on: 03/03/2019 10:53 AM   Modules accepted: Orders

## 2019-03-03 NOTE — Patient Instructions (Signed)

## 2019-03-03 NOTE — Assessment & Plan Note (Signed)
Lab Results  Component Value Date   LDLCALC 86 04/22/2017  Tolerating atorvastatin well, recommend continuation.

## 2019-03-03 NOTE — Progress Notes (Signed)
Douglas Reeves - 58 y.o. male MRN [id number]  Date of birth: 02/09/1961  Subjective Chief Complaint  Patient presents with  . Follow-up    follow up DM/ pt need refills of meds    HPI Douglas Reeves is a 58 y.o. male with history of T2DM and HLD here today for follow up of diabetes.  He reports that he is doing well.  He checks blood glucose every other day with readings around 120-130.  Last a1c 7.9%.  He has been working to reduce high carb foods and weight is down about 7lbs since last visit.  He denies symptoms of hypoglycemia.  He does not have an eye doctor.   He is due for annual flu vaccine today.   ROS:  A comprehensive ROS was completed and negative except as noted per HPI  No Known Allergies  Past Medical History:  Diagnosis Date  . Diabetes mellitus     Past Surgical History:  Procedure Laterality Date  . APPENDECTOMY  1972  . [place]  . VASECTOMY  2000    Social History   Socioeconomic History  . Marital status: Married    Spouse name: Not on file  . Number of children: Not on file  . Years of education: Not on file  . Highest education level: Not on file  Occupational History  . Not on file  Social Needs  . Financial resource strain: Not on file  . Food insecurity    Worry: Not on file    Inability: Not on file  . Transportation needs    Medical: Not on file    Non-medical: Not on file  Tobacco Use  . Smoking status: Never Smoker  . Smokeless tobacco: Never Used  Substance and Sexual Activity  . Alcohol use: No    Alcohol/week: 0.0 standard drinks  . Drug use: No  . Sexual activity: Not on file  Lifestyle  . Physical activity    Days per week: Not on file    Minutes per session: Not on file  . Stress: Not on file  Relationships  . Social Herbalist on phone: Not on file    Gets together: Not on file    Attends religious service: Not on file    Active member of club or organization: Not on file    Attends meetings of  clubs or organizations: Not on file    Relationship status: Not on file  Other Topics Concern  . Not on file  Social History Narrative  . Not on file    Family History  Problem Relation Age of Onset  . Colon cancer Neg Hx     Health Maintenance  Topic Date Due  . Hepatitis C Screening  07-30-1960  . OPHTHALMOLOGY EXAM  12/14/2010  . INFLUENZA VACCINE  11/12/2018  . HEMOGLOBIN A1C  06/01/2019  . FOOT EXAM  11/29/2019  . URINE MICROALBUMIN  11/29/2019  . TETANUS/TDAP  06/04/2021  . COLONOSCOPY  08/19/2024  . PNEUMOCOCCAL POLYSACCHARIDE VACCINE AGE 43-64 HIGH RISK  Completed  . HIV Screening  Completed    ----------------------------------------------------------------------------------------------------------------------------------------------------------------------------------------------------------------- Physical Exam BP 130/64   Pulse 80   Temp (!) 97.1 F (36.2 C) (Temporal)   Ht 6\' 2"  (1.88 m)   Wt 278 lb (126.1 kg)   SpO2 95%   BMI 35.69 kg/m   Physical Exam Constitutional:      Appearance: Normal appearance.  HENT:     Head: Normocephalic and atraumatic.  Mouth/Throat:     Mouth: Mucous membranes are moist.  Eyes:     General: No scleral icterus. Neck:     Musculoskeletal: Neck supple.  Cardiovascular:     Rate and Rhythm: Normal rate and regular rhythm.  Pulmonary:     Effort: Pulmonary effort is normal.     Breath sounds: Normal breath sounds.  Skin:    General: Skin is warm and dry.  Neurological:     General: No focal deficit present.     Mental Status: He is alert.  Psychiatric:        Mood and Affect: Mood normal.        Behavior: Behavior normal.     ------------------------------------------------------------------------------------------------------------------------------------------------------------------------------------------------------------------- Assessment and Plan  Diabetes mellitus without complication ([place])  -Update a1c and will plan to make adjustment to medications based on results.  Hopefully we'll see some improvement in this given lifestyle change and weight loss.  -Recommend low carb diet.  -He will check into seeing optometrist at [facility] where he gets Rx filled.  -Flu vaccine given today.  -F/in in 3 months.  Hyperlipidemia associated with type 2 diabetes mellitus [facility]) Lab Results  Component Value Date   LDLCALC 86 04/22/2017  Tolerating atorvastatin well, recommend continuation.   This visit occurred during the SARS-CoV-2 public health emergency.  Safety protocols were in place, including screening questions prior to the visit, additional usage of staff PPE, and extensive cleaning of exam room while observing appropriate contact time as indicated for disinfecting solutions.

## 2019-03-03 NOTE — Assessment & Plan Note (Signed)
-  Update a1c and will plan to make adjustment to medications based on results.  Hopefully we'll see some improvement in this given lifestyle change and weight loss.  -Recommend low carb diet.  -He will check into seeing optometrist at [facility] where he gets Rx filled.  -Flu vaccine given today.  -F/in in 3 months.

## 2019-03-06 NOTE — Progress Notes (Signed)
A1c improved since last check.  Continue to work on lifestyle change to control blood sugar and continue current medications.

## 2019-06-02 ENCOUNTER — Encounter: Payer: Self-pay | Admitting: Family Medicine

## 2019-06-02 ENCOUNTER — Ambulatory Visit (INDEPENDENT_AMBULATORY_CARE_PROVIDER_SITE_OTHER): Payer: 59 | Admitting: Family Medicine

## 2019-06-02 ENCOUNTER — Other Ambulatory Visit: Payer: Self-pay

## 2019-06-02 VITALS — Ht 74.0 in | Wt 278.0 lb

## 2019-06-02 DIAGNOSIS — E1169 Type 2 diabetes mellitus with other specified complication: Secondary | ICD-10-CM | POA: Diagnosis not present

## 2019-06-02 DIAGNOSIS — E785 Hyperlipidemia, unspecified: Secondary | ICD-10-CM

## 2019-06-02 DIAGNOSIS — E119 Type 2 diabetes mellitus without complications: Secondary | ICD-10-CM | POA: Diagnosis not present

## 2019-06-02 NOTE — Progress Notes (Signed)
Virtual Visit via Video Note  I connected with Douglas Reeves on 06/02/19 at  3:00 PM EST by a video enabled telemedicine application and verified that I am speaking with the correct person using two identifiers. Location patient: home Location provider: work  Persons participating in the virtual visit: patient, provider  I discussed the limitations of evaluation and management by telemedicine and the availability of in person appointments. The patient expressed understanding and agreed to proceed.  Chief Complaint  Patient presents with  . Establish Care  . Diabetes    3 month f/u.  Pt is due for an eye exam and requesting a referral or a recommendation.     HPI: Douglas Reeves is a 59 y.o. male former patient of Dr. Zigmund Daniel. He is seen today for routine DM f/u. He is due for eye exam.  Home fasting BS - 120-125.  Pt does not exercise/walk but knows he needs to.  He does not need any medication refills at this time. No hypo or hyperglycemia episodes. No CP, SOB, HA, n/v, dizziness.     Past Medical History:  Diagnosis Date  . Diabetes mellitus     Past Surgical History:  Procedure Laterality Date  . APPENDECTOMY  1972  . [place]  . VASECTOMY  2000    Family History  Problem Relation Age of Onset  . Colon cancer Neg Hx     Social History   Tobacco Use  . Smoking status: Never Smoker  . Smokeless tobacco: Never Used  Substance Use Topics  . Alcohol use: No    Alcohol/week: 0.0 standard drinks  . Drug use: No     Current Outpatient Medications:  .  atorvastatin (LIPITOR) 20 MG tablet, Take 1 tablet (20 mg total) by mouth at bedtime., Disp: 90 tablet, Rfl: 2 .  Dulaglutide (TRULICITY) 1.5 0000000 SOPN, INJECT 1.5 MG SUBCUTANEOUSLY ONCE A WEEK, Disp: 12 pen, Rfl: 2 .  empagliflozin (JARDIANCE) 25 MG TABS tablet, Take 25 mg by mouth daily., Disp: 90 tablet, Rfl: 5 .  glimepiride (AMARYL) 4 MG tablet, TAKE ONE TABLET BY MOUTH ONCE DAILY WITH BREAKFAST,  Disp: 90 tablet, Rfl: 11 .  metFORMIN (GLUCOPHAGE) 1000 MG tablet, TAKE 1 TABLET BY MOUTH TWICE DAILY WITH  A  MEAL, Disp: 180 tablet, Rfl: 4 .  pioglitazone (ACTOS) 45 MG tablet, Take 1 tablet (45 mg total) by mouth daily., Disp: 90 tablet, Rfl: 3  No Known Allergies    ROS: See pertinent positives and negatives per HPI.   EXAM:  VITALS per patient if applicable: Ht 6\' 2"  (1.88 m)   Wt 278 lb (126.1 kg)   BMI 35.69 kg/m    GENERAL: alert, oriented, appears well and in no acute distress  HEENT: atraumatic, conjunctiva clear, no obvious abnormalities on inspection of external nose and ears  NECK: normal movements of the head and neck  LUNGS: on inspection no signs of respiratory distress, breathing rate appears normal, no obvious gross SOB, gasping or wheezing, no conversational dyspnea  CV: no obvious cyanosis  PSYCH/NEURO: pleasant and cooperative, no obvious depression or anxiety, speech and thought processing grossly intact   ASSESSMENT AND PLAN:  1. Diabetes mellitus without complication ([place]) - last A1C 3 mo ago = 7.5 - home fasting BS 120-125 as per pt recall - cont current meds - Hemoglobin A1c; Future - pt will schedule lab appt next week - Ambulatory referral to Ophthalmology - f/u q102mo or sooner PRN  2. Hyperlipidemia associated  with type 2 diabetes mellitus ([place]) - cont on lipitor 20mg  daily - Lipid panel; Future - low fat diet and encouraged pt to start regular CV exercise routine    I discussed the assessment and treatment plan with the patient. The patient was provided an opportunity to ask questions and all were answered. The patient agreed with the plan and demonstrated an understanding of the instructions.   The patient was advised to call back or seek an in-person evaluation if the symptoms worsen or if the condition fails to improve as anticipated.   Letta Median, DO

## 2019-06-02 NOTE — Patient Instructions (Addendum)
Health Maintenance Due  Topic Date Due  . Hepatitis C Screening  06[birth year]  . OPHTHALMOLOGY EXAM Patient does not have a eye doctor.  Patient requesting a referral/recommendation. 12/14/2010    Depression screen PHQ 2/9 03/03/2019 07/31/2016 06/15/2014  Decreased Interest 0 0 0  Down, Depressed, Hopeless 0 0 0  PHQ - 2 Score 0 0 0   [facility] [place], [place], [place] [address] Phone: [phone]  [facility] [address] [place], [place], [place] [address] Phone: [phone]  COVID-19 Vaccine Information can be found at: [email or web address] For questions related to vaccine distribution or appointments, please email [email or web address] or call [phone].   We are committed to keeping you informed about the COVID-19 vaccine.  As the vaccine continues to become available for each phase, we will ensure that patients who meet the criteria receive the information they need to access vaccination opportunities. Continue to check your MyChart account and [email or web address] for updates. Please review the Phase 1b information below.  [place] On Tuesday, Jan. 19, the [facility]) and [place] began large-scale COVID-19 vaccinations at the [place]. The vaccinations are appointment only and for those 70 and older.  Walk-ins will not be accepted.  All appointments are currently filled. Please join our waiting list for the next available appointments. We will contact you when appointments become available. Please do not sign up more than once.  Join Our Waiting List  Our daily vaccination capacity will continue to increase, including expansion of our [employer] site, increasing mobile clinics across our service region and plans to open COVID-19 vaccination clinics in [place]  and [place] counties.  Other Vaccination Opportunities in [place] We are also working in partnership with county health agencies in our service counties to ensure continuing vaccination availability in the weeks and months ahead. Learn more about each county's vaccination efforts in the website links below:   [place]'s phase 1b vaccination guidelines, prioritizing those 65 and over as the next eligible group to receive the COVID-19 vaccine, are detailed at [email or web address].[email or web address].   Vaccine Safety and Effectiveness Clinical trials for the Pfizer COVID-19 vaccine involved 42,000 people and showed that the vaccine is more than 95% effective in preventing COVID-19 with no serious safety concerns. Similar results have been reported for the Moderna COVID-19 vaccine. Side effects reported in the [place] clinical trials include a sore arm at the injection site, fatigue, headache, chills and fever. While side effects from the [place] COVID-19 vaccine are higher than for a typical flu vaccine, they are lower in many ways than side effects from the leading vaccine to prevent shingles. Side effects are signs that a vaccine is working and are related to your immune system being stimulated to produce antibodies against infection. Side effects from vaccination are far less significant than health impacts from COVID-19.  Staying Informed Pharmacists, infectious disease doctors, critical care nurses and other experts at [facility] continue to speak publicly through media interviews and direct communication with our patients and communities about the safety, effectiveness and importance of vaccines to eliminate COVID-19. In addition, reliable information on vaccine safety, effectiveness, side effects and more is available on the following websites:  [place] Department of Health and Human Services COVID-19 Vaccine Information Website.  U.S.  Centers for Disease Control and Prevention XX123456 Vaccine Public Information  Website.  Staying Safe We agree with the CDC on what we can do to help our communities get back to normal: Getting "back to normal" is going to take all of our tools. If we use all the tools we have, we stand the best chance of getting our families, communities, schools and workplaces "back to normal" sooner:  Get vaccinated as soon as vaccines become available within the phase of the state's vaccination rollout plan for which you meet the eligibility criteria.  Wear a mask.  Stay 6 feet from others and avoid crowds.  Wash hands often.  For our most current information, please visit [email or web address].

## 2019-06-12 ENCOUNTER — Other Ambulatory Visit: Payer: Self-pay

## 2019-06-13 ENCOUNTER — Other Ambulatory Visit (INDEPENDENT_AMBULATORY_CARE_PROVIDER_SITE_OTHER): Payer: 59

## 2019-06-13 DIAGNOSIS — E119 Type 2 diabetes mellitus without complications: Secondary | ICD-10-CM | POA: Diagnosis not present

## 2019-06-13 DIAGNOSIS — E1169 Type 2 diabetes mellitus with other specified complication: Secondary | ICD-10-CM | POA: Diagnosis not present

## 2019-06-13 DIAGNOSIS — E785 Hyperlipidemia, unspecified: Secondary | ICD-10-CM

## 2019-06-14 LAB — LIPID PANEL
Cholesterol: 121 mg/dL (ref 0–200)
HDL: 43.3 mg/dL (ref >39.00)
NonHDL: 77.69
Total CHOL/HDL Ratio: 3
Triglycerides: 328 mg/dL — ABNORMAL HIGH (ref 0.0–149.0)
VLDL: 65.6 mg/dL — ABNORMAL HIGH (ref 0.0–40.0)

## 2019-06-14 LAB — HEMOGLOBIN A1C: Hgb A1c MFr Bld: 8.3 % — ABNORMAL HIGH (ref 4.6–6.5)

## 2019-06-14 LAB — LDL CHOLESTEROL, DIRECT: Direct LDL: 46 mg/dL

## 2019-06-15 ENCOUNTER — Encounter: Payer: Self-pay | Admitting: Family Medicine

## 2019-06-26 ENCOUNTER — Telehealth: Payer: Self-pay | Admitting: Family Medicine

## 2019-06-26 DIAGNOSIS — E119 Type 2 diabetes mellitus without complications: Secondary | ICD-10-CM

## 2019-06-26 DIAGNOSIS — E781 Pure hyperglyceridemia: Secondary | ICD-10-CM

## 2019-06-26 NOTE — Telephone Encounter (Signed)
Patient is calling and wanted to see if lab results were back. CB is [phone]

## 2019-06-27 NOTE — Telephone Encounter (Signed)
Pt is willing to start the medication to help lower his Triglycerides and increase his Trulicity dose.  Pt informed of lab results below, per Dr.C.   Your A1C increased from 7.5 to 8.3. I'd like to increase your trulicity dose from 1.5mg  to 3mg  and continue with your other meds as you are currently taking them. Your cholesterol is excellent and at goal. Your triglycerides are still significantly elevated at 328 (goal is less than 150). We should consider adding a medication to help lower these, along with following a low fat diet and engaging in regular cardiovascular exercise. Tricor (fenofibrate) is the med that works to lower triglycerides. Please let me know your thoughts.

## 2019-06-28 ENCOUNTER — Other Ambulatory Visit: Payer: Self-pay

## 2019-06-28 DIAGNOSIS — E119 Type 2 diabetes mellitus without complications: Secondary | ICD-10-CM

## 2019-06-28 MED ORDER — TRULICITY 1.5 MG/0.5ML ~~LOC~~ SOAJ: 3.0000 mg | SUBCUTANEOUS | 3 refills | Status: DC

## 2019-06-28 MED ORDER — TRULICITY 3 MG/0.5ML ~~LOC~~ SOAJ: 3.0000 mg | SUBCUTANEOUS | 0 refills | Status: DC

## 2019-06-28 MED ORDER — FENOFIBRATE 160 MG PO TABS: 160.0000 mg | ORAL_TABLET | Freq: Every day | ORAL | 3 refills | Status: DC

## 2019-06-28 NOTE — Telephone Encounter (Signed)
Douglas Reeves is calling from [place] regarding Trulicity that was sent to the pharmacy. CB [phone]

## 2019-06-28 NOTE — Telephone Encounter (Signed)
I called [facility] back and spoke with Yaw.  He informed me that the Trulicity needed to be sent as 3mg /0.39ml.  I informed him that a new rx will be resent today.  Dr. Loletha Grayer is aware.

## 2019-06-28 NOTE — Telephone Encounter (Signed)
New Rx sent to pharm

## 2019-06-28 NOTE — Telephone Encounter (Signed)
I called [facility] back and spoke with Yaw.  He informed me that the Trulicity needed to be sent as 3mg /0.32ml.  I informed him that a new rx will be resent today.  Dr. Loletha Grayer is aware.

## 2019-07-27 LAB — HM DIABETES EYE EXAM

## 2019-08-10 ENCOUNTER — Other Ambulatory Visit: Payer: Self-pay

## 2019-08-11 ENCOUNTER — Ambulatory Visit: Payer: 59 | Admitting: Family Medicine

## 2019-08-16 ENCOUNTER — Other Ambulatory Visit: Payer: Self-pay

## 2019-08-17 ENCOUNTER — Encounter: Payer: Self-pay | Admitting: Family Medicine

## 2019-08-17 ENCOUNTER — Ambulatory Visit: Payer: 59 | Admitting: Family Medicine

## 2019-08-17 VITALS — BP 128/80 | HR 76 | Temp 97.3°F | Ht 74.0 in | Wt 276.2 lb

## 2019-08-17 DIAGNOSIS — E119 Type 2 diabetes mellitus without complications: Secondary | ICD-10-CM | POA: Diagnosis not present

## 2019-08-17 DIAGNOSIS — E1169 Type 2 diabetes mellitus with other specified complication: Secondary | ICD-10-CM | POA: Diagnosis not present

## 2019-08-17 DIAGNOSIS — E785 Hyperlipidemia, unspecified: Secondary | ICD-10-CM | POA: Diagnosis not present

## 2019-08-17 NOTE — Progress Notes (Signed)
Chief Complaint  Patient presents with  . Diabetes    3 month follow up visit.    HPI: *Douglas Reeves is a 59 y.o. male here for DM, hyperlipidemia follow-up. At last OV, we increased his trulicity from 1.5mg  to 3mg  and no side effects.  He is UTD on eye exams.  Pt does not check BS at home.  Hypoglycemia/Hypergylcemic episodes: no  Diet: pt states he has been trying to eat better - more fish, less red meat and pork; veggies; he loves potatoes, has pasta every 1-2 wks. He has cut back on sweets.  Exercise: he has been walking 2 miles a few a days per week  Lab Results  Component Value Date   HGBA1C 8.3 (H) 06/13/2019   Lab Results  Component Value Date   MICROALBUR <0.7 11/29/2018   Lab Results  Component Value Date   CREATININE 0.92 03/03/2019   Lab Results  Component Value Date   CHOL 121 06/13/2019   HDL 43.30 06/13/2019   LDLCALC 86 04/22/2017   LDLDIRECT 46.0 06/13/2019   TRIG 328.0 (H) 06/13/2019   CHOLHDL 3 06/13/2019    The ASCVD Risk score Mikey Bussing DC Jr., et al., 2013) failed to calculate for the following reasons:   The valid total cholesterol range is 130 to 320 mg/dL   Past Medical History:  Diagnosis Date  . Diabetes mellitus     Past Surgical History:  Procedure Laterality Date  . APPENDECTOMY  1972  . [place]  . VASECTOMY  2000    Social History   Socioeconomic History  . Marital status: Married    Spouse name: Not on file  . Number of children: Not on file  . Years of education: Not on file  . Highest education level: Not on file  Occupational History  . Not on file  Tobacco Use  . Smoking status: Never Smoker  . Smokeless tobacco: Never Used  Substance and Sexual Activity  . Alcohol use: No    Alcohol/week: 0.0 standard drinks  . Drug use: No  . Sexual activity: Not on file  Other Topics Concern  . Not on file  Social History Narrative  . Not on file   Social Determinants of Health   Financial Resource  Strain:   . Difficulty of Paying Living Expenses:   Food Insecurity:   . Worried About Charity fundraiser in the Last Year:   . Arboriculturist in the Last Year:   Transportation Needs:   . Film/video editor (Medical):   Marland Kitchen Lack of Transportation (Non-Medical):   Physical Activity:   . Days of Exercise per Week:   . Minutes of Exercise per Session:   Stress:   . Feeling of Stress :   Social Connections:   . Frequency of Communication with Friends and Family:   . Frequency of Social Gatherings with Friends and Family:   . Attends Religious Services:   . Active Member of Clubs or Organizations:   . Attends Archivist Meetings:   Marland Kitchen Marital Status:   Intimate Partner Violence:   . Fear of Current or Ex-Partner:   . Emotionally Abused:   Marland Kitchen Physically Abused:   . Sexually Abused:     Family History  Problem Relation Age of Onset  . Colon cancer Neg Hx      Immunization History  Administered Date(s) Administered  . Influenza Split 03/24/2012  . Influenza,inj,Quad PF,6+ Mos 01/02/2016, 12/28/2017,  03/03/2019  . Influenza-Unspecified 02/11/2017  . Pneumococcal Polysaccharide-23 11/29/2018  . Tdap 06/05/2011    Outpatient Encounter Medications as of 08/17/2019  Medication Sig  . atorvastatin (LIPITOR) 20 MG tablet Take 1 tablet (20 mg total) by mouth at bedtime.  . Dulaglutide (TRULICITY) 3 0000000 SOPN Inject 3 mg into the skin once a week.  . empagliflozin (JARDIANCE) 25 MG TABS tablet Take 25 mg by mouth daily.  . fenofibrate 160 MG tablet Take 1 tablet (160 mg total) by mouth daily.  Marland Kitchen glimepiride (AMARYL) 4 MG tablet TAKE ONE TABLET BY MOUTH ONCE DAILY WITH BREAKFAST  . metFORMIN (GLUCOPHAGE) 1000 MG tablet TAKE 1 TABLET BY MOUTH TWICE DAILY WITH  A  MEAL  . pioglitazone (ACTOS) 45 MG tablet Take 1 tablet (45 mg total) by mouth daily.   No facility-administered encounter medications on file as of 08/17/2019.     ROS: Pertinent positives and negatives  noted in HPI. Remainder of ROS non-contributory    No Known Allergies  BP 128/80 (BP Location: Left Arm, Patient Position: Sitting, Cuff Size: Normal)   Pulse 76   Temp (!) 97.3 F (36.3 C) (Temporal)   Ht 6\' 2"  (1.88 m)   Wt 276 lb 3.2 oz (125.3 kg)   SpO2 98%   BMI 35.46 kg/m   Physical Exam  Constitutional: He is oriented to person, place, and time. He appears well-developed and well-nourished. No distress.  Cardiovascular: Normal rate, regular rhythm and normal heart sounds.  Pulmonary/Chest: Effort normal and breath sounds normal. No respiratory distress. He has no wheezes. He has no rhonchi.  Musculoskeletal:        General: No edema.     Cervical back: Neck supple.  Lymphadenopathy:    He has no cervical adenopathy.  Neurological: He is alert and oriented to person, place, and time.  Psychiatric: He has a normal mood and affect. His behavior is normal.     A/P:  1. Diabetes mellitus without complication ([place]) - pt has improved diet, walking regularly - at last OV, trulicity was increased to 3mg  weekly and pt has tolerated this w/o issue - has is also taking metformin 1000mg  BID, amaryl 4mg  daily, jardiance 25mg  daily, actos 45mg  daily - Hemoglobin A1c - UTD on eye exam - cont with statin, no on ace/arb - cont with low carb diet ad regular CV exercise - f/u in 3 mo  2. Hyperlipidemia associated with type 2 diabetes mellitus ([place]) - cont on lipitor 20mg  daily and fenofibrate 160mg  daily - FLP done in 06/2019   Call pt with results as he does not use his MyChart  This visit occurred during the SARS-CoV-2 public health emergency.  Safety protocols were in place, including screening questions prior to the visit, additional usage of staff PPE, and extensive cleaning of exam room while observing appropriate contact time as indicated for disinfecting solutions.

## 2019-08-18 LAB — HEMOGLOBIN A1C: Hgb A1c MFr Bld: 8 % — ABNORMAL HIGH (ref 4.6–6.5)

## 2019-08-25 ENCOUNTER — Other Ambulatory Visit: Payer: Self-pay | Admitting: Family Medicine

## 2019-08-25 DIAGNOSIS — E119 Type 2 diabetes mellitus without complications: Secondary | ICD-10-CM

## 2019-08-25 MED ORDER — TRULICITY 4.5 MG/0.5ML ~~LOC~~ SOAJ: 4.5000 mg | SUBCUTANEOUS | 5 refills | Status: DC

## 2019-11-16 ENCOUNTER — Other Ambulatory Visit: Payer: Self-pay

## 2019-11-17 ENCOUNTER — Ambulatory Visit: Payer: 59 | Admitting: Family Medicine

## 2019-11-17 ENCOUNTER — Encounter: Payer: Self-pay | Admitting: Family Medicine

## 2019-11-17 VITALS — BP 126/78 | HR 76 | Temp 97.5°F | Ht 74.0 in | Wt 283.2 lb

## 2019-11-17 DIAGNOSIS — E1169 Type 2 diabetes mellitus with other specified complication: Secondary | ICD-10-CM

## 2019-11-17 DIAGNOSIS — E119 Type 2 diabetes mellitus without complications: Secondary | ICD-10-CM

## 2019-11-17 DIAGNOSIS — E785 Hyperlipidemia, unspecified: Secondary | ICD-10-CM

## 2019-11-17 NOTE — Progress Notes (Signed)
Chief Complaint  Patient presents with  . Diabetes    Pt here for 3 month DM follow up viist.    HPI: *Douglas Reeves is a 59 y.o. male here for DM, hyperlipidemia follow-up. For his DM, pt is on metformin 1000mg  BID, actos 45mg  daily, amaryl 4mg  daily, jardiance 25mg  daily, trulicity 45mg  weekly. The trulicity was increased from 3.0mg  after last OV 3 mo ago.  He is also on lipitor 20mg  daily and fenofibrate 160mg  daily.   Pt does ocassionally check BS at home. Readings: 131 yesterday (post-prandial) Hypoglycemia/Hypergylcemic episodes: none  Diet: pt loves carbs/starches Exercise: pt was walking regularly, lost 20lbs but gained 13lbs back since he stopped diet  Lab Results  Component Value Date   HGBA1C 8.0 (H) 08/17/2019   Lab Results  Component Value Date   MICROALBUR <0.7 11/29/2018   Lab Results  Component Value Date   CREATININE 0.92 03/03/2019   Lab Results  Component Value Date   CHOL 121 06/13/2019   HDL 43.30 06/13/2019   LDLCALC 86 04/22/2017   LDLDIRECT 46.0 06/13/2019   TRIG 328.0 (H) 06/13/2019   CHOLHDL 3 06/13/2019    The ASCVD Risk score Mikey Bussing DC Jr., et al., 2013) failed to calculate for the following reasons:   The valid total cholesterol range is 130 to 320 mg/dL   Past Medical History:  Diagnosis Date  . Diabetes mellitus     Past Surgical History:  Procedure Laterality Date  . APPENDECTOMY  1972  . [place]  . VASECTOMY  2000    Social History   Socioeconomic History  . Marital status: Married    Spouse name: Not on file  . Number of children: Not on file  . Years of education: Not on file  . Highest education level: Not on file  Occupational History  . Not on file  Tobacco Use  . Smoking status: Never Smoker  . Smokeless tobacco: Never Used  Substance and Sexual Activity  . Alcohol use: No    Alcohol/week: 0.0 standard drinks  . Drug use: No  . Sexual activity: Not on file  Other Topics Concern  . Not on  file  Social History Narrative  . Not on file   Social Determinants of Health   Financial Resource Strain:   . Difficulty of Paying Living Expenses:   Food Insecurity:   . Worried About Charity fundraiser in the Last Year:   . Arboriculturist in the Last Year:   Transportation Needs:   . Film/video editor (Medical):   Marland Kitchen Lack of Transportation (Non-Medical):   Physical Activity:   . Days of Exercise per Week:   . Minutes of Exercise per Session:   Stress:   . Feeling of Stress :   Social Connections:   . Frequency of Communication with Friends and Family:   . Frequency of Social Gatherings with Friends and Family:   . Attends Religious Services:   . Active Member of Clubs or Organizations:   . Attends Archivist Meetings:   Marland Kitchen Marital Status:   Intimate Partner Violence:   . Fear of Current or Ex-Partner:   . Emotionally Abused:   Marland Kitchen Physically Abused:   . Sexually Abused:     Family History  Problem Relation Age of Onset  . Colon cancer Neg Hx      Immunization History  Administered Date(s) Administered  . Influenza Split 03/24/2012  . Influenza,inj,Quad PF,6+  Mos 01/02/2016, 12/28/2017, 03/03/2019  . Influenza-Unspecified 02/11/2017  . Pneumococcal Polysaccharide-23 11/29/2018  . Tdap 06/05/2011    Outpatient Encounter Medications as of 11/17/2019  Medication Sig  . atorvastatin (LIPITOR) 20 MG tablet Take 1 tablet (20 mg total) by mouth at bedtime.  . Dulaglutide (TRULICITY) 4.5 HY/0.7PX SOPN Inject 4.5 mg into the skin once a week.  . empagliflozin (JARDIANCE) 25 MG TABS tablet Take 25 mg by mouth daily.  . fenofibrate 160 MG tablet Take 1 tablet (160 mg total) by mouth daily.  Marland Kitchen glimepiride (AMARYL) 4 MG tablet TAKE ONE TABLET BY MOUTH ONCE DAILY WITH BREAKFAST  . metFORMIN (GLUCOPHAGE) 1000 MG tablet TAKE 1 TABLET BY MOUTH TWICE DAILY WITH  A  MEAL  . pioglitazone (ACTOS) 45 MG tablet Take 1 tablet (45 mg total) by mouth daily.   No  facility-administered encounter medications on file as of 11/17/2019.     ROS: Pertinent positives and negatives noted in HPI. Remainder of ROS non-contributory    No Known Allergies  BP 126/78 (BP Location: Left Arm, Patient Position: Sitting, Cuff Size: Normal)   Pulse 76   Temp (!) 97.5 F (36.4 C) (Temporal)   Ht 6\' 2"  (1.88 m)   Wt 283 lb 3.2 oz (128.5 kg)   SpO2 98%   BMI 36.36 kg/m   Physical Exam Constitutional:      Appearance: He is obese.  Cardiovascular:     Rate and Rhythm: Normal rate and regular rhythm.     Pulses: Normal pulses.  Pulmonary:     Effort: Pulmonary effort is normal. No respiratory distress.     Breath sounds: Normal breath sounds.  Abdominal:     General: Bowel sounds are normal. There is no distension.     Palpations: Abdomen is soft.     Tenderness: There is no abdominal tenderness.  Musculoskeletal:     Right lower leg: No edema.     Left lower leg: No edema.  Skin:    General: Skin is warm and dry.  Neurological:     General: No focal deficit present.     Mental Status: He is alert and oriented to person, place, and time.  Psychiatric:        Mood and Affect: Mood normal.        Behavior: Behavior normal.      A/P:  1. Hyperlipidemia associated with type 2 diabetes mellitus ([place]) - on lipitor 20mg  daily, fenofibrate 160mg  daily - pt was walking regularly and lost 20lbs but stopped walking and gained 13lbs back so encouraged him to resume this - Lipid panel; Future  2. Diabetes mellitus without complication ([place]) - last A1C improved from 8.3 to 8.0, trulicity was increased to 3.0mg  weekly after that and all other meds were continued - pt was walking regularly and lost 20lbs but stopped walking and gained 13lbs back so encouraged him to resume this - encouraged pt to limit carb/starch intake - lab closed this afternoon so pt will RTO next week for fasting lab appt and I will contact him via phone with results and recommendations  when results available - Lipid panel; Future - Hemoglobin A1c; Future - Microalbumin / creatinine urine ratio; Future - f/u in 58mo  This visit occurred during the SARS-CoV-2 public health emergency.  Safety protocols were in place, including screening questions prior to the visit, additional usage of staff PPE, and extensive cleaning of exam room while observing appropriate contact time as indicated for disinfecting solutions.

## 2019-11-20 ENCOUNTER — Other Ambulatory Visit: Payer: Self-pay

## 2019-11-21 ENCOUNTER — Other Ambulatory Visit (INDEPENDENT_AMBULATORY_CARE_PROVIDER_SITE_OTHER): Payer: 59

## 2019-11-21 DIAGNOSIS — E785 Hyperlipidemia, unspecified: Secondary | ICD-10-CM | POA: Diagnosis not present

## 2019-11-21 DIAGNOSIS — E1169 Type 2 diabetes mellitus with other specified complication: Secondary | ICD-10-CM | POA: Diagnosis not present

## 2019-11-21 DIAGNOSIS — E119 Type 2 diabetes mellitus without complications: Secondary | ICD-10-CM | POA: Diagnosis not present

## 2019-11-21 LAB — MICROALBUMIN / CREATININE URINE RATIO
Creatinine,U: 34.7 mg/dL
Microalb Creat Ratio: 2 mg/g (ref 0.0–30.0)
Microalb, Ur: <0.7 mg/dL (ref 0.0–1.9)

## 2019-11-21 LAB — LIPID PANEL
Cholesterol: 116 mg/dL (ref 0–200)
HDL: 50.5 mg/dL (ref >39.00)
LDL Cholesterol: 52 mg/dL (ref 0–99)
NonHDL: 65.78
Total CHOL/HDL Ratio: 2
Triglycerides: 68 mg/dL (ref 0.0–149.0)
VLDL: 13.6 mg/dL (ref 0.0–40.0)

## 2019-11-21 LAB — HEMOGLOBIN A1C: Hgb A1c MFr Bld: 8.1 % — ABNORMAL HIGH (ref 4.6–6.5)

## 2019-11-24 ENCOUNTER — Other Ambulatory Visit: Payer: Self-pay | Admitting: Family Medicine

## 2019-11-24 DIAGNOSIS — E669 Obesity, unspecified: Secondary | ICD-10-CM

## 2019-11-24 DIAGNOSIS — E1169 Type 2 diabetes mellitus with other specified complication: Secondary | ICD-10-CM | POA: Insufficient documentation

## 2019-12-08 ENCOUNTER — Other Ambulatory Visit: Payer: Self-pay | Admitting: Family Medicine

## 2019-12-23 ENCOUNTER — Other Ambulatory Visit: Payer: Self-pay | Admitting: Family Medicine

## 2019-12-25 NOTE — Telephone Encounter (Signed)
Forwarding to PCP.

## 2020-01-23 ENCOUNTER — Ambulatory Visit: Payer: 59 | Admitting: Endocrinology

## 2020-01-23 ENCOUNTER — Other Ambulatory Visit: Payer: Self-pay

## 2020-01-23 ENCOUNTER — Encounter: Payer: Self-pay | Admitting: Endocrinology

## 2020-01-23 VITALS — BP 136/62 | HR 82 | Ht 74.0 in

## 2020-01-23 DIAGNOSIS — E119 Type 2 diabetes mellitus without complications: Secondary | ICD-10-CM

## 2020-01-23 LAB — POCT GLYCOSYLATED HEMOGLOBIN (HGB A1C): Hemoglobin A1C: 8.2 % — AB (ref 4.0–5.6)

## 2020-01-23 MED ORDER — ACARBOSE 25 MG PO TABS: 25.0000 mg | ORAL_TABLET | Freq: Three times a day (TID) | ORAL | 3 refills | Status: DC

## 2020-01-23 NOTE — Patient Instructions (Addendum)
good diet and exercise significantly improve the control of your diabetes.  please let me know if you wish to be referred to a dietician.  high blood sugar is very risky to your health.  you should see an eye doctor and dentist every year.  It is very important to get all recommended vaccinations.  Controlling your blood pressure and cholesterol drastically reduces the damage diabetes does to your body.  Those who smoke should quit.  Please discuss these with your doctor.  check your blood sugar once a day.  vary the time of day when you check, between before the 3 meals, and at bedtime.  also check if you have symptoms of your blood sugar being too high or too low.  please keep a record of the readings and bring it to your next appointment here (or you can bring the meter itself).  You can write it on any piece of paper.  please call us sooner if your blood sugar goes below 70, or if you have a lot of readings over 200. I have sent a prescription to your pharmacy, to add "acarbose." If necessary, we can add colesevelam or bromocriptine. Please come back for a follow-up appointment in 6 weeks.

## 2020-01-23 NOTE — Progress Notes (Signed)
Subjective:    Patient ID: Douglas Reeves, male    DOB: [birth year], 59 y.o.   MRN: [id number]  HPI pt is referred by Dr Bryan Lemma, for diabetes.  Pt states DM was dx'ed in 2003; he is unaware of any chronic complications; he has never been on insulin; pt says his diet and exercise are not good; he has never had pancreatitis, pancreatic surgery, severe hypoglycemia or DKA.  He is a Pharmacist, community.  He takes Trulicity and 4 oral meds.  Pt says cbg varies from 120-200's Past Medical History:  Diagnosis Date  . Diabetes mellitus     Past Surgical History:  Procedure Laterality Date  . APPENDECTOMY  1972  . [place]  . VASECTOMY  2000    Social History   Socioeconomic History  . Marital status: Married    Spouse name: Not on file  . Number of children: Not on file  . Years of education: Not on file  . Highest education level: Not on file  Occupational History  . Not on file  Tobacco Use  . Smoking status: Never Smoker  . Smokeless tobacco: Never Used  Substance and Sexual Activity  . Alcohol use: No    Alcohol/week: 0.0 standard drinks  . Drug use: No  . Sexual activity: Not on file  Other Topics Concern  . Not on file  Social History Narrative  . Not on file   Social Determinants of Health   Financial Resource Strain:   . Difficulty of Paying Living Expenses: Not on file  Food Insecurity:   . Worried About Charity fundraiser in the Last Year: Not on file  . Ran Out of Food in the Last Year: Not on file  Transportation Needs:   . Lack of Transportation (Medical): Not on file  . Lack of Transportation (Non-Medical): Not on file  Physical Activity:   . Days of Exercise per Week: Not on file  . Minutes of Exercise per Session: Not on file  Stress:   . Feeling of Stress : Not on file  Social Connections:   . Frequency of Communication with Friends and Family: Not on file  . Frequency of Social Gatherings with Friends and Family: Not on file  . Attends  Religious Services: Not on file  . Active Member of Clubs or Organizations: Not on file  . Attends Archivist Meetings: Not on file  . Marital Status: Not on file  Intimate Partner Violence:   . Fear of Current or Ex-Partner: Not on file  . Emotionally Abused: Not on file  . Physically Abused: Not on file  . Sexually Abused: Not on file    Current Outpatient Medications on File Prior to Visit  Medication Sig Dispense Refill  . atorvastatin (LIPITOR) 20 MG tablet TAKE 1 TABLET BY MOUTH AT BEDTIME 90 tablet 3  . Dulaglutide (TRULICITY) 4.5 QB/3.4LP SOPN Inject 4.5 mg into the skin once a week. 2 mL 5  . empagliflozin (JARDIANCE) 25 MG TABS tablet Take 25 mg by mouth daily. 90 tablet 5  . fenofibrate 160 MG tablet Take 1 tablet (160 mg total) by mouth daily. 90 tablet 3  . glimepiride (AMARYL) 4 MG tablet TAKE ONE TABLET BY MOUTH ONCE DAILY WITH BREAKFAST 90 tablet 11  . metFORMIN (GLUCOPHAGE) 1000 MG tablet TAKE 1 TABLET BY MOUTH TWICE DAILY WITH  A  MEAL 180 tablet 4  . pioglitazone (ACTOS) 45 MG tablet Take 1 tablet by mouth  once daily 90 tablet 3   No current facility-administered medications on file prior to visit.    No Known Allergies  Family History  Problem Relation Age of Onset  . Diabetes Brother   . Colon cancer Neg Hx     BP 136/62 (BP Location: Left Arm)   Pulse 82   Ht 6\' 2"  (1.88 m)   SpO2 94%   BMI 36.36 kg/m    Review of Systems denies weight loss, blurry vision, chest pain, sob, n/v, urinary frequency, memory loss, hypoglycemia, and depression.     Objective:   Physical Exam VITAL SIGNS:  See vs page GENERAL: no distress Pulses: dorsalis pedis intact bilat.   MSK: no deformity of the feet CV: no leg edema Skin:  no ulcer on the feet.  normal color and temp on the feet. Neuro: sensation is intact to touch on the feet, but decreased from normal.    Lab Results  Component Value Date   HGBA1C 8.2 (A) 01/23/2020   Lab Results  Component  Value Date   CREATININE 0.92 03/03/2019   BUN 10 03/03/2019   NA 140 03/03/2019   K 4.5 03/03/2019   CL 104 03/03/2019   CO2 30 03/03/2019    I have reviewed outside records, and summarized: Pt was noted to have elevated A1c, and referred here.  Dyslipidemia wa also addressed--she wa on several meds for this      Assessment & Plan:  Type 2 DM: uncontrolled.  We discussed.  Pt wants to exhaust non-insulin rx before considering insulin.   Patient Instructions  good diet and exercise significantly improve the control of your diabetes.  please let me know if you wish to be referred to a dietician.  high blood sugar is very risky to your health.  you should see an eye doctor and dentist every year.  It is very important to get all recommended vaccinations.  Controlling your blood pressure and cholesterol drastically reduces the damage diabetes does to your body.  Those who smoke should quit.  Please discuss these with your doctor.  check your blood sugar once a day.  vary the time of day when you check, between before the 3 meals, and at bedtime.  also check if you have symptoms of your blood sugar being too high or too low.  please keep a record of the readings and bring it to your next appointment here (or you can bring the meter itself).  You can write it on any piece of paper.  please call us sooner if your blood sugar goes below 70, or if you have a lot of readings over 200. I have sent a prescription to your pharmacy, to add "acarbose." If necessary, we can add colesevelam or bromocriptine. Please come back for a follow-up appointment in 6 weeks.

## 2020-02-22 ENCOUNTER — Encounter: Payer: Self-pay | Admitting: Family Medicine

## 2020-02-22 ENCOUNTER — Telehealth (INDEPENDENT_AMBULATORY_CARE_PROVIDER_SITE_OTHER): Payer: 59 | Admitting: Family Medicine

## 2020-02-22 VITALS — Ht 74.0 in | Wt 290.0 lb

## 2020-02-22 DIAGNOSIS — E1169 Type 2 diabetes mellitus with other specified complication: Secondary | ICD-10-CM | POA: Diagnosis not present

## 2020-02-22 DIAGNOSIS — E785 Hyperlipidemia, unspecified: Secondary | ICD-10-CM | POA: Diagnosis not present

## 2020-02-22 DIAGNOSIS — E669 Obesity, unspecified: Secondary | ICD-10-CM

## 2020-02-22 NOTE — Progress Notes (Signed)
Virtual Visit via Video Note  I connected with Douglas Reeves on 02/22/20 at  4:00 PM EST by a video enabled telemedicine application and verified that I am speaking with the correct person using two identifiers. Location patient: home Location provider:  home office Persons participating in the virtual visit: patient, provider  I discussed the limitations of evaluation and management by telemedicine and the availability of in person appointments. The patient expressed understanding and agreed to proceed.  Chief Complaint  Patient presents with  . Follow-up    3 month f/u DM.  average BS 120.      HPI: Douglas Reeves is a 59 y.o. male seen today for routine f/u on DM. Pt reports fasting BS around 120. He recently established with endo Dr. Loanne Drilling about 4 wks and has f/u appt in about 3wks. Pt is taking actos 45mg  daily, metformin 1000mg  BID, jardiance 25mg  daily, glimepiride 4mg  daily, trulicity 4.5mg  weekly. He was started on acarbose 25mg  TID by Dr. Loanne Drilling at appt on 02/01/20. Denies any side effects or episodes of hypoglycemia.  He is also on lipitor 20mg  daily. He does not need any med refills at this time. He has no acute issues or concerns.   Lab Results  Component Value Date   HGBA1C 8.2 (A) 01/23/2020   Lab Results  Component Value Date   CHOL 116 11/21/2019   HDL 50.50 11/21/2019   LDLCALC 52 11/21/2019   LDLDIRECT 46.0 06/13/2019   TRIG 68.0 11/21/2019   CHOLHDL 2 11/21/2019    Past Medical History:  Diagnosis Date  . Diabetes mellitus     Past Surgical History:  Procedure Laterality Date  . APPENDECTOMY  1972  . [place]  . VASECTOMY  2000    Family History  Problem Relation Age of Onset  . Diabetes Brother   . Colon cancer Neg Hx     Social History   Tobacco Use  . Smoking status: Never Smoker  . Smokeless tobacco: Never Used  Substance Use Topics  . Alcohol use: No    Alcohol/week: 0.0 standard drinks  . Drug use: No      Current Outpatient Medications:  .  acarbose (PRECOSE) 25 MG tablet, Take 1 tablet (25 mg total) by mouth 3 (three) times daily with meals., Disp: 270 tablet, Rfl: 3 .  atorvastatin (LIPITOR) 20 MG tablet, TAKE 1 TABLET BY MOUTH AT BEDTIME, Disp: 90 tablet, Rfl: 3 .  Dulaglutide (TRULICITY) 4.5 XN/2.3FT SOPN, Inject 4.5 mg into the skin once a week., Disp: 2 mL, Rfl: 5 .  empagliflozin (JARDIANCE) 25 MG TABS tablet, Take 25 mg by mouth daily., Disp: 90 tablet, Rfl: 5 .  fenofibrate 160 MG tablet, Take 1 tablet (160 mg total) by mouth daily., Disp: 90 tablet, Rfl: 3 .  glimepiride (AMARYL) 4 MG tablet, TAKE ONE TABLET BY MOUTH ONCE DAILY WITH BREAKFAST, Disp: 90 tablet, Rfl: 11 .  metFORMIN (GLUCOPHAGE) 1000 MG tablet, TAKE 1 TABLET BY MOUTH TWICE DAILY WITH  A  MEAL, Disp: 180 tablet, Rfl: 4 .  pioglitazone (ACTOS) 45 MG tablet, Take 1 tablet by mouth once daily, Disp: 90 tablet, Rfl: 3  No Known Allergies    ROS: See pertinent positives and negatives per HPI.   EXAM:  VITALS per patient if applicable: Ht 6\' 2"  (1.88 m)   Wt 290 lb (131.5 kg) Comment: pt reported  BMI 37.23 kg/m    GENERAL: alert, oriented, appears well and in no acute  distress  HEENT: atraumatic, conjunctiva clear, no obvious abnormalities on inspection of external nose and ears  NECK: normal movements of the head and neck  LUNGS: on inspection no signs of respiratory distress, breathing rate appears normal, no obvious gross SOB, gasping or wheezing, no conversational dyspnea  CV: no obvious cyanosis  PSYCH/NEURO: pleasant and cooperative, no obvious depression or anxiety, speech and thought processing grossly intact   ASSESSMENT AND PLAN: 1. Type 2 diabetes mellitus with obesity (HCC) - cont current meds - actos 45mg  daily, metformin 1000mg  BID, jardiance 25mg  daily, glimepiride 4mg  daily, trulicity 4.5mg  weekly, acarbose 25mg  TID - stressed importance of low carb diet, regular exercise, weight  loss - pt has f/u appt with endo Dr. Loanne Drilling in about 3 wks  - explained to pt that he does not need to f/u with myself and endo for his DM. He can continue routine f/u with Dr. Loanne Drilling and see me annually for CPE and PRN  2. Hyperlipidemia associated with type 2 diabetes mellitus ([place]) - LDL at goal on last LFP in 11/2019 - cont lipitor 20mg  daily    I discussed the assessment and treatment plan with the patient. The patient was provided an opportunity to ask questions and all were answered. The patient agreed with the plan and demonstrated an understanding of the instructions.   The patient was advised to call back or seek an in-person evaluation if the symptoms worsen or if the condition fails to improve as anticipated.   Letta Median, DO

## 2020-03-11 ENCOUNTER — Other Ambulatory Visit: Payer: Self-pay | Admitting: Family Medicine

## 2020-03-12 ENCOUNTER — Ambulatory Visit: Payer: 59 | Admitting: Endocrinology

## 2020-03-12 ENCOUNTER — Other Ambulatory Visit: Payer: Self-pay

## 2020-03-12 ENCOUNTER — Encounter: Payer: Self-pay | Admitting: Endocrinology

## 2020-03-12 VITALS — BP 136/72 | HR 68 | Ht 74.0 in | Wt 292.0 lb

## 2020-03-12 DIAGNOSIS — Z23 Encounter for immunization: Secondary | ICD-10-CM | POA: Diagnosis not present

## 2020-03-12 DIAGNOSIS — E119 Type 2 diabetes mellitus without complications: Secondary | ICD-10-CM

## 2020-03-12 LAB — POCT GLYCOSYLATED HEMOGLOBIN (HGB A1C): Hemoglobin A1C: 8.2 % — AB (ref 4.0–5.6)

## 2020-03-12 MED ORDER — GLIMEPIRIDE 4 MG PO TABS
4.0000 mg | ORAL_TABLET | Freq: Every day | ORAL | 3 refills | Status: DC
Start: 2020-03-12 — End: 2020-05-22

## 2020-03-12 MED ORDER — METFORMIN HCL ER 500 MG PO TB24: 2000.0000 mg | ORAL_TABLET | Freq: Every day | ORAL | 3 refills | Status: DC

## 2020-03-12 MED ORDER — ACARBOSE 50 MG PO TABS: 50.0000 mg | ORAL_TABLET | Freq: Three times a day (TID) | ORAL | 3 refills | Status: DC

## 2020-03-12 NOTE — Patient Instructions (Addendum)
check your blood sugar once a day.  vary the time of day when you check, between before the 3 meals, and at bedtime.  also check if you have symptoms of your blood sugar being too high or too low.  please keep a record of the readings and bring it to your next appointment here (or you can bring the meter itself).  You can write it on any piece of paper.  please call us sooner if your blood sugar goes below 70, or if you have a lot of readings over 200. I have sent a prescription to your pharmacy, to increase the acarbose.  If necessary, we can add colesevelam or bromocriptine.  Please come back for a follow-up appointment in 2 months.

## 2020-03-12 NOTE — Progress Notes (Signed)
Subjective:    Patient ID: Douglas Reeves, male    DOB: 1960/08/08, 59 y.o.   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  pt states he feels well in general.   Past Medical History:  Diagnosis Date  . Diabetes mellitus     Past Surgical History:  Procedure Laterality Date  . APPENDECTOMY  1972  . [place]  . VASECTOMY  2000    Social History   Socioeconomic History  . Marital status: Married    Spouse name: Not on file  . Number of children: Not on file  . Years of education: Not on file  . Highest education level: Not on file  Occupational History  . Not on file  Tobacco Use  . Smoking status: Never Smoker  . Smokeless tobacco: Never Used  Substance and Sexual Activity  . Alcohol use: No    Alcohol/week: 0.0 standard drinks  . Drug use: No  . Sexual activity: Not on file  Other Topics Concern  . Not on file  Social History Narrative  . Not on file   Social Determinants of Health   Financial Resource Strain:   . Difficulty of Paying Living Expenses: Not on file  Food Insecurity:   . Worried About Charity fundraiser in the Last Year: Not on file  . Ran Out of Food in the Last Year: Not on file  Transportation Needs:   . Lack of Transportation (Medical): Not on file  . Lack of Transportation (Non-Medical): Not on file  Physical Activity:   . Days of Exercise per Week: Not on file  . Minutes of Exercise per Session: Not on file  Stress:   . Feeling of Stress : Not on file  Social Connections:   . Frequency of Communication with Friends and Family: Not on file  . Frequency of Social Gatherings  with Friends and Family: Not on file  . Attends Religious Services: Not on file  . Active Member of Clubs or Organizations: Not on file  . Attends Archivist Meetings: Not on file  . Marital Status: Not on file  Intimate Partner Violence:   . Fear of Current or Ex-Partner: Not on file  . Emotionally Abused: Not on file  . Physically Abused: Not on file  . Sexually Abused: Not on file    Current Outpatient Medications on File Prior to Visit  Medication Sig Dispense Refill  . atorvastatin (LIPITOR) 20 MG tablet TAKE 1 TABLET BY MOUTH AT BEDTIME 90 tablet 3  . Dulaglutide (TRULICITY) 4.5 CH/8.5ID SOPN Inject 4.5 mg into the skin once a week. 2 mL 5  . empagliflozin (JARDIANCE) 25 MG TABS tablet Take 25 mg by mouth daily. 90 tablet 5  . fenofibrate 160 MG tablet Take 1 tablet (160 mg total) by mouth daily. 90 tablet 3  . pioglitazone (ACTOS) 45 MG tablet Take 1 tablet by mouth once daily 90 tablet 3   No current facility-administered medications on file prior to visit.    No Known Allergies  Family History  Problem Relation Age of Onset  . Diabetes Brother   .  Colon cancer Neg Hx     BP 136/72   Pulse 68   Ht 6\' 2"  (1.88 m)   Wt 292 lb (132.5 kg)   SpO2 98%   BMI 37.49 kg/m    Review of Systems He denies hypoglycemia    Objective:   Physical Exam VITAL SIGNS:  See vs page GENERAL: no distress Pulses: dorsalis pedis intact bilat.   MSK: no deformity of the feet CV: no leg edema Skin:  no ulcer on the feet.  normal color and temp on the feet. Neuro: sensation is intact to touch on the feet, but decreased from normal.    Lab Results  Component Value Date   HGBA1C 8.2 (A) 03/12/2020       Assessment & Plan:  Type 2 DM, with PN: uncontrolled.  He declines to add another med now.   Patient Instructions  check your blood sugar once a day.  vary the time of day when you check, between before the 3 meals, and at bedtime.  also check if you have symptoms of  your blood sugar being too high or too low.  please keep a record of the readings and bring it to your next appointment here (or you can bring the meter itself).  You can write it on any piece of paper.  please call us sooner if your blood sugar goes below 70, or if you have a lot of readings over 200. I have sent a prescription to your pharmacy, to increase the acarbose.  If necessary, we can add colesevelam or bromocriptine.  Please come back for a follow-up appointment in 2 months.

## 2020-03-12 NOTE — Telephone Encounter (Signed)
Forwarding to PCP.

## 2020-03-26 ENCOUNTER — Telehealth: Payer: Self-pay | Admitting: Endocrinology

## 2020-03-26 ENCOUNTER — Other Ambulatory Visit: Payer: Self-pay

## 2020-03-26 DIAGNOSIS — E119 Type 2 diabetes mellitus without complications: Secondary | ICD-10-CM

## 2020-03-26 MED ORDER — METFORMIN HCL ER 500 MG PO TB24: 2000.0000 mg | ORAL_TABLET | Freq: Every day | ORAL | 3 refills | Status: DC

## 2020-03-26 MED ORDER — TRULICITY 4.5 MG/0.5ML ~~LOC~~ SOAJ: 4.5000 mg | SUBCUTANEOUS | 5 refills | Status: DC

## 2020-03-26 NOTE — Telephone Encounter (Signed)
RX sent to pharmacy  

## 2020-03-26 NOTE — Telephone Encounter (Signed)
Patient called and requested refill for Trulicity and Douglas Reeves be sent to [facility] on [address]

## 2020-05-04 ENCOUNTER — Other Ambulatory Visit: Payer: Self-pay | Admitting: Family Medicine

## 2020-05-14 ENCOUNTER — Ambulatory Visit: Payer: 59 | Admitting: Endocrinology

## 2020-05-22 ENCOUNTER — Ambulatory Visit: Payer: 59 | Admitting: Endocrinology

## 2020-05-22 ENCOUNTER — Other Ambulatory Visit: Payer: Self-pay

## 2020-05-22 VITALS — BP 160/64 | HR 86 | Ht 74.0 in | Wt 283.2 lb

## 2020-05-22 DIAGNOSIS — E119 Type 2 diabetes mellitus without complications: Secondary | ICD-10-CM

## 2020-05-22 LAB — POCT GLYCOSYLATED HEMOGLOBIN (HGB A1C): Hemoglobin A1C: 7.3 % — AB (ref 4.0–5.6)

## 2020-05-22 MED ORDER — TRULICITY 4.5 MG/0.5ML ~~LOC~~ SOAJ: 4.5000 mg | SUBCUTANEOUS | 5 refills | Status: DC

## 2020-05-22 MED ORDER — GLIMEPIRIDE 4 MG PO TABS: 4.0000 mg | ORAL_TABLET | Freq: Every day | ORAL | 3 refills | Status: DC

## 2020-05-22 NOTE — Progress Notes (Signed)
Subjective:    Patient ID: Douglas Reeves, male    DOB: 1960-04-15, 60 y.o.   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   Interval history: no cbg record, but states cbg's are in the 100's.  pt states he feels well in general.  He takes meds as rx'ed.   Past Medical History:  Diagnosis Date  . Diabetes mellitus     Past Surgical History:  Procedure Laterality Date  . APPENDECTOMY  1972  . [place]  . VASECTOMY  2000    Social History   Socioeconomic History  . Marital status: Married    Spouse name: Not on file  . Number of children: Not on file  . Years of education: Not on file  . Highest education level: Not on file  Occupational History  . Not on file  Tobacco Use  . Smoking status: Never Smoker  . Smokeless tobacco: Never Used  Substance and Sexual Activity  . Alcohol use: No    Alcohol/week: 0.0 standard drinks  . Drug use: No  . Sexual activity: Not on file  Other Topics Concern  . Not on file  Social History Narrative  . Not on file   Social Determinants of Health   Financial Resource Strain: Not on file  Food Insecurity: Not on file  Transportation Needs: Not on file  Physical Activity: Not on file  Stress: Not on file  Social Connections: Not on file  Intimate Partner Violence: Not on file    Current Outpatient Medications on File Prior to Visit  Medication Sig Dispense Refill  . acarbose (PRECOSE) 50 MG tablet Take 1 tablet (50 mg total) by mouth 3 (three) times daily with meals. 270 tablet 3  . atorvastatin (LIPITOR) 20 MG tablet TAKE 1 TABLET BY MOUTH AT BEDTIME 90 tablet 3  . fenofibrate 160 MG tablet Take 1 tablet (160 mg total) by mouth daily. 90  tablet 3  . JARDIANCE 25 MG TABS tablet Take 1 tablet by mouth once daily 90 tablet 0  . metFORMIN (GLUCOPHAGE-XR) 500 MG 24 hr tablet Take 4 tablets (2,000 mg total) by mouth daily. 360 tablet 3  . pioglitazone (ACTOS) 45 MG tablet Take 1 tablet by mouth once daily 90 tablet 3   No current facility-administered medications on file prior to visit.    No Known Allergies  Family History  Problem Relation Age of Onset  . Diabetes Brother   . Colon cancer Neg Hx     BP (!) 160/64 (BP Location: Right Arm, Patient Position: Sitting, Cuff Size: Large)   Pulse 86   Ht 6\' 2"  (1.88 m)   Wt 283 lb 3.2 oz (128.5 kg)   SpO2 97%   BMI 36.36 kg/m    Review of Systems He denies hypoglycemia/n/v/d    Objective:   Physical Exam VITAL SIGNS:  See vs page GENERAL: no distress Pulses: dorsalis pedis intact bilat.   MSK: no deformity of the feet CV: no leg edema Skin:  no ulcer on the feet.  normal color and temp on the feet. Neuro: sensation is intact to touch on the feet.   Lab Results  Component Value Date   HGBA1C  7.3 (A) 05/22/2020       Assessment & Plan:  Type 2 DM: well-controlled.    Patient Instructions  check your blood sugar once a day.  vary the time of day when you check, between before the 3 meals, and at bedtime.  also check if you have symptoms of your blood sugar being too high or too low.  please keep a record of the readings and bring it to your next appointment here (or you can bring the meter itself).  You can write it on any piece of paper.  please call us sooner if your blood sugar goes below 70, or if you have a lot of readings over 200. Please continue the same 5 diabetes medications If necessary, we can add colesevelam or bromocriptine.  Please come back for a follow-up appointment in 4 months.

## 2020-05-22 NOTE — Patient Instructions (Signed)
check your blood sugar once a day.  vary the time of day when you check, between before the 3 meals, and at bedtime.  also check if you have symptoms of your blood sugar being too high or too low.  please keep a record of the readings and bring it to your next appointment here (or you can bring the meter itself).  You can write it on any piece of paper.  please call us sooner if your blood sugar goes below 70, or if you have a lot of readings over 200. Please continue the same 5 diabetes medications If necessary, we can add colesevelam or bromocriptine.  Please come back for a follow-up appointment in 4 months.

## 2020-06-01 ENCOUNTER — Other Ambulatory Visit: Payer: Self-pay | Admitting: Family Medicine

## 2020-06-01 DIAGNOSIS — E781 Pure hyperglyceridemia: Secondary | ICD-10-CM

## 2020-06-03 NOTE — Telephone Encounter (Signed)
Last VV 02/22/20 Last fill 06/28/19  #90/3

## 2020-06-24 ENCOUNTER — Other Ambulatory Visit: Payer: Self-pay

## 2020-06-24 MED ORDER — PIOGLITAZONE HCL 45 MG PO TABS: 45.0000 mg | ORAL_TABLET | Freq: Every day | ORAL | 0 refills | Status: DC

## 2020-06-24 MED ORDER — EMPAGLIFLOZIN 25 MG PO TABS: 25.0000 mg | ORAL_TABLET | Freq: Every day | ORAL | 0 refills | Status: DC

## 2020-06-24 MED ORDER — ACARBOSE 50 MG PO TABS: 50.0000 mg | ORAL_TABLET | Freq: Three times a day (TID) | ORAL | 3 refills | Status: DC

## 2020-06-25 ENCOUNTER — Other Ambulatory Visit: Payer: Self-pay

## 2020-06-25 DIAGNOSIS — E781 Pure hyperglyceridemia: Secondary | ICD-10-CM

## 2020-06-25 MED ORDER — FENOFIBRATE 160 MG PO TABS: 160.0000 mg | ORAL_TABLET | Freq: Every day | ORAL | 0 refills | Status: DC

## 2020-09-08 ENCOUNTER — Other Ambulatory Visit: Payer: Self-pay | Admitting: Family Medicine

## 2020-09-08 DIAGNOSIS — E781 Pure hyperglyceridemia: Secondary | ICD-10-CM

## 2020-09-11 ENCOUNTER — Ambulatory Visit: Payer: 59 | Admitting: Endocrinology

## 2020-10-28 ENCOUNTER — Other Ambulatory Visit: Payer: Self-pay | Admitting: Endocrinology

## 2020-12-04 ENCOUNTER — Other Ambulatory Visit: Payer: Self-pay

## 2020-12-04 ENCOUNTER — Ambulatory Visit: Payer: 59 | Admitting: Endocrinology

## 2020-12-04 VITALS — BP 100/42 | HR 75 | Ht 74.0 in | Wt 290.4 lb

## 2020-12-04 DIAGNOSIS — E1165 Type 2 diabetes mellitus with hyperglycemia: Secondary | ICD-10-CM

## 2020-12-04 DIAGNOSIS — E119 Type 2 diabetes mellitus without complications: Secondary | ICD-10-CM

## 2020-12-04 LAB — POCT GLYCOSYLATED HEMOGLOBIN (HGB A1C): Hemoglobin A1C: 9 % — AB (ref 4.0–5.6)

## 2020-12-04 MED ORDER — ACARBOSE 50 MG PO TABS: 50.0000 mg | ORAL_TABLET | Freq: Three times a day (TID) | ORAL | 3 refills | Status: DC

## 2020-12-04 MED ORDER — BROMOCRIPTINE MESYLATE 0.8 MG PO TABS: 1.0000 | ORAL_TABLET | Freq: Every day | ORAL | 3 refills | Status: DC

## 2020-12-04 MED ORDER — PIOGLITAZONE HCL 45 MG PO TABS: 45.0000 mg | ORAL_TABLET | Freq: Every day | ORAL | 3 refills | Status: DC

## 2020-12-04 MED ORDER — TRULICITY 4.5 MG/0.5ML ~~LOC~~ SOAJ: 4.5000 mg | SUBCUTANEOUS | 5 refills | Status: DC

## 2020-12-04 MED ORDER — EMPAGLIFLOZIN 25 MG PO TABS: 25.0000 mg | ORAL_TABLET | Freq: Every day | ORAL | 3 refills | Status: DC

## 2020-12-04 MED ORDER — COLESEVELAM HCL 625 MG PO TABS: 625.0000 mg | ORAL_TABLET | Freq: Every day | ORAL | 3 refills | Status: DC

## 2020-12-04 MED ORDER — METFORMIN HCL ER 500 MG PO TB24: 2000.0000 mg | ORAL_TABLET | Freq: Every day | ORAL | 3 refills | Status: DC

## 2020-12-04 NOTE — Patient Instructions (Addendum)
check your blood sugar once a day.  vary the time of day when you check, between before the 3 meals, and at bedtime.  also check if you have symptoms of your blood sugar being too high or too low.  please keep a record of the readings and bring it to your next appointment here (or you can bring the meter itself).  You can write it on any piece of paper.  please call us sooner if your blood sugar goes below 70, or if you have a lot of readings over 200. Please continue the same 5 diabetes medications, and:  I have sent prescriptions to your pharmacy, to add colesevelam or bromocriptine.   Please come back for a follow-up appointment in 2 months.

## 2020-12-04 NOTE — Progress Notes (Signed)
Subjective:    Patient ID: Douglas Reeves, male    DOB: November 25, 1960, 60 y.o.   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   Interval history: no cbg record, but states cbg's are in the 100's.  pt states he feels well in general.  He takes meds as rx'ed.   Past Medical History:  Diagnosis Date   Diabetes mellitus     Past Surgical History:  Procedure Laterality Date   APPENDECTOMY  1972   BACK SURGERY  1973   VASECTOMY  2000    Social History   Socioeconomic History   Marital status: Married    Spouse name: Not on file   Number of children: Not on file   Years of education: Not on file   Highest education level: Not on file  Occupational History   Not on file  Tobacco Use   Smoking status: Never   Smokeless tobacco: Never  Substance and Sexual Activity   Alcohol use: No    Alcohol/week: 0.0 standard drinks   Drug use: No   Sexual activity: Not on file  Other Topics Concern   Not on file  Social History Narrative   Not on file   Social Determinants of Health   Financial Resource Strain: Not on file  Food Insecurity: Not on file  Transportation Needs: Not on file  Physical Activity: Not on file  Stress: Not on file  Social Connections: Not on file  Intimate Partner Violence: Not on file    Current Outpatient Medications on File Prior to Visit  Medication Sig Dispense Refill   atorvastatin (LIPITOR) 20 MG tablet TAKE 1 TABLET BY MOUTH AT BEDTIME 90 tablet 3   fenofibrate 160 MG tablet Take 1 tablet (160 mg total) by mouth daily. **Need appointment before anymore refills** 90 tablet 0   glimepiride (AMARYL) 4 MG tablet Take 1 tablet (4 mg total) by mouth daily with breakfast. 90  tablet 3   No current facility-administered medications on file prior to visit.    No Known Allergies  Family History  Problem Relation Age of Onset   Diabetes Brother    Colon cancer Neg Hx     BP (!) 100/42 (BP Location: Right Arm, Patient Position: Sitting, Cuff Size: Large)   Pulse 75   Ht '6\' 2"'$  (1.88 m)   Wt 290 lb 6.4 oz (131.7 kg)   SpO2 97%   BMI 37.29 kg/m    Review of Systems He denies hypoglycemia    Objective:   Physical Exam Pulses: dorsalis pedis intact bilat.   MSK: no deformity of the feet CV: trace bilat leg edema.   Skin:  no ulcer on the feet.  normal color and temp on the feet. Neuro: sensation is intact to touch on the feet. Ext: there is bilateral onychomycosis of the toenails.     Lab Results  Component Value Date   HGBA1C 9.0 (A) 12/04/2020      Assessment & Plan:  Type 2 DM: uncontrolled.  I advised insulin, but he declines.   Patient Instructions  check your blood sugar once a day.  vary the time of day when you check, between before the 3 meals, and at  bedtime.  also check if you have symptoms of your blood sugar being too high or too low.  please keep a record of the readings and bring it to your next appointment here (or you can bring the meter itself).  You can write it on any piece of paper.  please call us sooner if your blood sugar goes below 70, or if you have a lot of readings over 200. Please continue the same 5 diabetes medications, and:  I have sent prescriptions to your pharmacy, to add colesevelam or bromocriptine.   Please come back for a follow-up appointment in 2 months.   Marland Kitchen

## 2020-12-09 ENCOUNTER — Other Ambulatory Visit (HOSPITAL_COMMUNITY): Payer: Self-pay

## 2020-12-17 ENCOUNTER — Other Ambulatory Visit (HOSPITAL_COMMUNITY): Payer: Self-pay

## 2020-12-17 ENCOUNTER — Telehealth: Payer: Self-pay | Admitting: Pharmacy Technician

## 2020-12-17 NOTE — Telephone Encounter (Addendum)
Patient Advocate Encounter   Received notification from [place] that prior authorization for JARDIANCE is required.   PA submitted on 12/17/2020 Key [id number] Status is APPROVED  Request Reference Number: [id number]. JARDIANCE TAB '25MG'$  is approved through 12/17/2021    [facility] will continue to follow   Ronney Asters, CPhT Patient Advocate [facility] Phone: [phone] Fax:  [phone]

## 2020-12-23 ENCOUNTER — Other Ambulatory Visit: Payer: Self-pay

## 2020-12-23 ENCOUNTER — Telehealth: Payer: Self-pay | Admitting: Pharmacy Technician

## 2020-12-23 MED ORDER — ATORVASTATIN CALCIUM 20 MG PO TABS: 20.0000 mg | ORAL_TABLET | Freq: Every day | ORAL | 0 refills | Status: DC

## 2020-12-23 NOTE — Telephone Encounter (Addendum)
Patient Advocate Encounter   Received notification from [place] that prior authorization for TRULICITY is required.   PA submitted on 12/23/2020 Key [id number] Status is APPROVED [facility] 12/23/2021  [id number]    [facility] will continue to follow   Ronney Asters, CPhT Patient Advocate [facility] Phone: [phone] Fax:  [phone]

## 2020-12-23 NOTE — Telephone Encounter (Signed)
Refill request for: Atorvastatin 20 mg LR 12/13/19, #90, 3 rfs LOV 02/22/20 FOV  none scheduled.  Please review and advise.  Thanks. Dm/cma

## 2020-12-30 ENCOUNTER — Other Ambulatory Visit: Payer: Self-pay

## 2020-12-30 DIAGNOSIS — E781 Pure hyperglyceridemia: Secondary | ICD-10-CM

## 2020-12-30 MED ORDER — FENOFIBRATE 160 MG PO TABS: 160.0000 mg | ORAL_TABLET | Freq: Every day | ORAL | 0 refills | Status: DC

## 2020-12-30 NOTE — Telephone Encounter (Signed)
Refill request for: Fenofibrate 160 mg  LR 09/10/20, #90, 0 rfs LOV  02/22/20 FOV  none scheduled.   Please review and advise.  Thanks. Dm/cma

## 2021-02-05 ENCOUNTER — Other Ambulatory Visit: Payer: Self-pay

## 2021-02-05 ENCOUNTER — Ambulatory Visit: Payer: 59 | Admitting: Endocrinology

## 2021-02-05 ENCOUNTER — Other Ambulatory Visit: Payer: Self-pay | Admitting: Endocrinology

## 2021-02-05 VITALS — BP 150/62 | HR 86 | Ht 74.0 in | Wt 288.4 lb

## 2021-02-05 DIAGNOSIS — Z23 Encounter for immunization: Secondary | ICD-10-CM

## 2021-02-05 DIAGNOSIS — E119 Type 2 diabetes mellitus without complications: Secondary | ICD-10-CM | POA: Diagnosis not present

## 2021-02-05 LAB — POCT GLYCOSYLATED HEMOGLOBIN (HGB A1C): Hemoglobin A1C: 7.7 % — AB (ref 4.0–5.6)

## 2021-02-05 NOTE — Patient Instructions (Addendum)
check your blood sugar once a day.  vary the time of day when you check, between before the 3 meals, and at bedtime.  also check if you have symptoms of your blood sugar being too high or too low.  please keep a record of the readings and bring it to your next appointment here (or you can bring the meter itself).  You can write it on any piece of paper.  please call us sooner if your blood sugar goes below 70, or if you have a lot of readings over 200. Please continue the same 7 diabetes medications, and:    Blood tests are requested for you today.  We'll let you know about the results.  Please come back for a follow-up appointment in 4 months.

## 2021-02-05 NOTE — Progress Notes (Signed)
Subjective:    Patient ID: Douglas Reeves, male    DOB: 29-Sep-1960, 60 y.o.   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   Other:  he has never been on insulin, and he declines for now.   Interval history: no cbg record, but states cbg's vary from 180-200.  pt states he feels well in general.  He takes meds as rx'ed.   Past Medical History:  Diagnosis Date   Diabetes mellitus     Past Surgical History:  Procedure Laterality Date   APPENDECTOMY  1972   BACK SURGERY  1973   VASECTOMY  2000    Social History   Socioeconomic History   Marital status: Married    Spouse name: Not on file   Number of children: Not on file   Years of education: Not on file   Highest education level: Not on file  Occupational History   Not on file  Tobacco Use   Smoking status: Never   Smokeless tobacco: Never  Substance and Sexual Activity   Alcohol use: No    Alcohol/week: 0.0 standard drinks   Drug use: No   Sexual activity: Not on file  Other Topics Concern   Not on file  Social History Narrative   Not on file   Social Determinants of Health   Financial Resource Strain: Not on file  Food Insecurity: Not on file  Transportation Needs: Not on file  Physical Activity: Not on file  Stress: Not on file  Social Connections: Not on file  Intimate Partner Violence: Not on file    Current Outpatient Medications on File Prior to Visit  Medication Sig Dispense Refill   acarbose (PRECOSE) 50 MG tablet Take 1 tablet (50 mg total) by mouth 3 (three) times daily with meals. 270 tablet 3   atorvastatin (LIPITOR) 20 MG tablet Take 1 tablet (20 mg total) by mouth at bedtime. 90 tablet 0   Bromocriptine Mesylate 0.8 MG TABS Take 1 tablet (0.8 mg total) by  mouth daily. 90 tablet 3   colesevelam (WELCHOL) 625 MG tablet Take 1 tablet (625 mg total) by mouth daily. 90 tablet 3   Dulaglutide (TRULICITY) 4.5 GT/3.6IW SOPN Inject 4.5 mg into the skin once a week. 6 mL 5   empagliflozin (JARDIANCE) 25 MG TABS tablet Take 1 tablet (25 mg total) by mouth daily. 90 tablet 3   fenofibrate 160 MG tablet Take 1 tablet (160 mg total) by mouth daily. **Need appointment before anymore refills** 90 tablet 0   glimepiride (AMARYL) 4 MG tablet Take 1 tablet (4 mg total) by mouth daily with breakfast. 90 tablet 3   pioglitazone (ACTOS) 45 MG tablet Take 1 tablet (45 mg total) by mouth daily. 90 tablet 3   No current facility-administered medications on file prior to visit.    No Known Allergies  Family History  Problem Relation Age of Onset   Diabetes Brother    Colon cancer Neg Hx     BP (!) 150/62 (BP Location: Right Arm, Patient Position: Sitting, Cuff Size: Large)   Pulse 86   Ht 6\' 2"  (1.88 m)   Wt 288 lb 6.4 oz (130.8 kg)   SpO2 97%   BMI 37.03 kg/m    Review of Systems  He denies hypoglycemia/N/V/HB    Objective:   Physical Exam Ext: trace bilat leg edema.     Lab Results  Component Value Date   HGBA1C 7.7 (A) 02/05/2021      Assessment & Plan:  Type 2 DM: well-controlled HTN: check BMET  Patient Instructions  check your blood sugar once a day.  vary the time of day when you check, between before the 3 meals, and at bedtime.  also check if you have symptoms of your blood sugar being too high or too low.  please keep a record of the readings and bring it to your next appointment here (or you can bring the meter itself).  You can write it on any piece of paper.  please call us sooner if your blood sugar goes below 70, or if you have a lot of readings over 200. Please continue the same 7 diabetes medications, and:    Blood tests are requested for you today.  We'll let you know about the results.  Please come back for a follow-up  appointment in 4 months.

## 2021-02-06 LAB — BASIC METABOLIC PANEL
BUN: 17 mg/dL (ref 6–23)
CO2: 28 mEq/L (ref 19–32)
Calcium: 9.6 mg/dL (ref 8.4–10.5)
Chloride: 103 mEq/L (ref 96–112)
Creatinine, Ser: 1.32 mg/dL (ref 0.40–1.50)
GFR: 58.95 mL/min — ABNORMAL LOW (ref >60.00)
Glucose, Bld: 114 mg/dL — ABNORMAL HIGH (ref 70–99)
Potassium: 4.3 mEq/L (ref 3.5–5.1)
Sodium: 139 mEq/L (ref 135–145)

## 2021-02-06 LAB — T4, FREE: Free T4: 0.85 ng/dL (ref 0.60–1.60)

## 2021-02-06 LAB — TSH: TSH: 2.67 u[IU]/mL (ref 0.35–5.50)

## 2021-02-06 MED ORDER — METFORMIN HCL ER 500 MG PO TB24: 1500.0000 mg | ORAL_TABLET | Freq: Every day | ORAL | 3 refills | Status: DC

## 2021-03-13 ENCOUNTER — Telehealth: Payer: Self-pay

## 2021-03-13 DIAGNOSIS — E119 Type 2 diabetes mellitus without complications: Secondary | ICD-10-CM

## 2021-03-13 MED ORDER — TRULICITY 4.5 MG/0.5ML ~~LOC~~ SOAJ: 4.5000 mg | SUBCUTANEOUS | 5 refills | Status: DC

## 2021-03-13 NOTE — Telephone Encounter (Signed)
Patient took his last dose of Trulicity on Sunday. He needs a refill sent to [facility] at [facility].  Call back phone [phone]

## 2021-03-13 NOTE — Telephone Encounter (Signed)
Rx now sent

## 2021-03-14 ENCOUNTER — Telehealth: Payer: Self-pay | Admitting: Endocrinology

## 2021-03-14 NOTE — Telephone Encounter (Signed)
please contact patient:  Please move up next appt to next available.

## 2021-03-24 ENCOUNTER — Other Ambulatory Visit: Payer: Self-pay | Admitting: Family

## 2021-03-24 DIAGNOSIS — E781 Pure hyperglyceridemia: Secondary | ICD-10-CM

## 2021-03-25 ENCOUNTER — Telehealth: Payer: Self-pay | Admitting: Endocrinology

## 2021-03-25 ENCOUNTER — Other Ambulatory Visit: Payer: Self-pay | Admitting: Endocrinology

## 2021-03-25 MED ORDER — VICTOZA 18 MG/3ML ~~LOC~~ SOPN: 2.4000 mg | PEN_INJECTOR | Freq: Every day | SUBCUTANEOUS | 3 refills | Status: DC

## 2021-03-25 NOTE — Telephone Encounter (Signed)
Pt called and unable to get the following filled Dulaglutide (TRULICITY) 4.5 VT/5.5EZ SOPN  on back order unsure when it will be available. PT needs to know if alternate is available.  Pharmacy is:  [facility] ([place]), [place] - [address] Phone:  [phone]  Fax:  [phone]

## 2021-03-26 NOTE — Telephone Encounter (Signed)
Attempted to contact the patient regarding alternative medication, LVM for a call back if needed

## 2021-03-26 NOTE — Telephone Encounter (Signed)
Patient has now been informed that Victoza has been sent in as an alternative to Trulicity to take 1/day

## 2021-03-28 ENCOUNTER — Other Ambulatory Visit: Payer: Self-pay | Admitting: Endocrinology

## 2021-03-28 ENCOUNTER — Other Ambulatory Visit: Payer: Self-pay | Admitting: Family

## 2021-03-28 DIAGNOSIS — E781 Pure hyperglyceridemia: Secondary | ICD-10-CM

## 2021-04-05 ENCOUNTER — Other Ambulatory Visit: Payer: Self-pay | Admitting: Family

## 2021-04-05 DIAGNOSIS — E781 Pure hyperglyceridemia: Secondary | ICD-10-CM

## 2021-04-22 NOTE — Telephone Encounter (Signed)
Patient's appointment has been moved up to 05/16/21 (Patient requests Friday's after 3 pm)

## 2021-04-30 ENCOUNTER — Other Ambulatory Visit: Payer: Self-pay | Admitting: Family

## 2021-04-30 DIAGNOSIS — E781 Pure hyperglyceridemia: Secondary | ICD-10-CM

## 2021-05-16 ENCOUNTER — Ambulatory Visit: Payer: 59 | Admitting: Endocrinology

## 2021-05-20 ENCOUNTER — Ambulatory Visit: Payer: 59 | Admitting: Endocrinology

## 2021-05-20 ENCOUNTER — Other Ambulatory Visit: Payer: Self-pay

## 2021-05-20 VITALS — BP 148/68 | HR 80 | Ht 74.0 in | Wt 290.4 lb

## 2021-05-20 DIAGNOSIS — E119 Type 2 diabetes mellitus without complications: Secondary | ICD-10-CM | POA: Diagnosis not present

## 2021-05-20 LAB — POCT GLYCOSYLATED HEMOGLOBIN (HGB A1C): Hemoglobin A1C: 8.3 % — AB (ref 4.0–5.6)

## 2021-05-20 MED ORDER — OZEMPIC (2 MG/DOSE) 8 MG/3ML ~~LOC~~ SOPN: 2.0000 mg | PEN_INJECTOR | SUBCUTANEOUS | 3 refills | Status: DC

## 2021-05-20 NOTE — Progress Notes (Signed)
Subjective:    Patient ID: Douglas Reeves, male    DOB: Oct 02, 1960, 61 y.o.   MRN: [id number]  HPI Pt returns for f/u of diabetes mellitus: DM type: 2 Dx'ed: 9379 Complications: none Therapy: Trulicity and 6 oral meds.   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   Other:  he has never been on insulin, and he declines.   Interval history: no cbg record, but states cbg's vary from 90-200.  pt states he feels well in general.  He has been missing Trulicity, due to lack of availability.   Past Medical History:  Diagnosis Date   Diabetes mellitus     Past Surgical History:  Procedure Laterality Date   APPENDECTOMY  1972   BACK SURGERY  1973   VASECTOMY  2000    Social History   Socioeconomic History   Marital status: Married    Spouse name: Not on file   Number of children: Not on file   Years of education: Not on file   Highest education level: Not on file  Occupational History   Not on file  Tobacco Use   Smoking status: Never   Smokeless tobacco: Never  Substance and Sexual Activity   Alcohol use: No    Alcohol/week: 0.0 standard drinks   Drug use: No   Sexual activity: Not on file  Other Topics Concern   Not on file  Social History Narrative   Not on file   Social Determinants of Health   Financial Resource Strain: Not on file  Food Insecurity: Not on file  Transportation Needs: Not on file  Physical Activity: Not on file  Stress: Not on file  Social Connections: Not on file  Intimate Partner Violence: Not on file    Current Outpatient Medications on File Prior to Visit  Medication Sig Dispense Refill   acarbose (PRECOSE) 50 MG tablet Take 1 tablet (50 mg total) by mouth 3 (three) times daily with meals. 270 tablet 3   atorvastatin (LIPITOR) 20 MG tablet TAKE 1 TABLET BY MOUTH AT BEDTIME 90 tablet 0   Bromocriptine Mesylate 0.8 MG TABS Take 1 tablet  (0.8 mg total) by mouth daily. 90 tablet 3   colesevelam (WELCHOL) 625 MG tablet Take 1 tablet (625 mg total) by mouth daily. 90 tablet 3   empagliflozin (JARDIANCE) 25 MG TABS tablet Take 1 tablet (25 mg total) by mouth daily. 90 tablet 3   fenofibrate 160 MG tablet TAKE 1 TABLET BY MOUTH ONCE DAILY. NEEDS APPOINTMENT BEFORE ANYMORE REFILLS. 90 tablet 0   glimepiride (AMARYL) 4 MG tablet Take 1 tablet (4 mg total) by mouth daily with breakfast. 90 tablet 3   metFORMIN (GLUCOPHAGE-XR) 500 MG 24 hr tablet Take 3 tablets (1,500 mg total) by mouth daily. 270 tablet 3   pioglitazone (ACTOS) 45 MG tablet Take 1 tablet (45 mg total) by mouth daily. 90 tablet 3   No current facility-administered medications on file prior to visit.    No Known Allergies  Family History  Problem Relation Age of Onset   Diabetes Brother    Colon cancer Neg Hx     BP (!) 148/68    Pulse 80    Ht 6\' 2"  (1.88 m)    Wt 290 lb 6.4 oz (131.7 kg)    SpO2 96%    BMI 37.29 kg/m    Review of Systems Denies N/V/HB/bloating.  Objective:   Physical Exam  EXT: no leg edema  A1c=8.3%    Assessment & Plan:  Type 2 DM: uncontrolled.  We discussed.  As this appears to be due to lack of drug availability, we'll try changing to a different GLP  Patient Instructions  check your blood sugar once a day.  vary the time of day when you check, between before the 3 meals, and at bedtime.  also check if you have symptoms of your blood sugar being too high or too low.  please keep a record of the readings and bring it to your next appointment here (or you can bring the meter itself).  You can write it on any piece of paper.  please call us sooner if your blood sugar goes below 70, or if you have a lot of readings over 200.   I have sent a prescription to your pharmacy, to change Trulicity/Victoza to Ozempic.   Please continue the same other diabetes medications. Please come back for a follow-up appointment in 2-3 months.

## 2021-05-20 NOTE — Patient Instructions (Addendum)
check your blood sugar once a day.  vary the time of day when you check, between before the 3 meals, and at bedtime.  also check if you have symptoms of your blood sugar being too high or too low.  please keep a record of the readings and bring it to your next appointment here (or you can bring the meter itself).  You can write it on any piece of paper.  please call us sooner if your blood sugar goes below 70, or if you have a lot of readings over 200.   I have sent a prescription to your pharmacy, to change Trulicity/Victoza to Ozempic.   Please continue the same other diabetes medications. Please come back for a follow-up appointment in 2-3 months.

## 2021-05-26 ENCOUNTER — Emergency Department (HOSPITAL_COMMUNITY)
Admission: EM | Admit: 2021-05-26 | Discharge: 2021-05-26 | Disposition: A | Discharge status: Home / Self Care | Payer: 59 | Attending: Emergency Medicine | Admitting: Emergency Medicine

## 2021-05-26 ENCOUNTER — Other Ambulatory Visit: Payer: Self-pay

## 2021-05-26 ENCOUNTER — Encounter (HOSPITAL_COMMUNITY): Payer: Self-pay | Admitting: Emergency Medicine

## 2021-05-26 ENCOUNTER — Emergency Department (HOSPITAL_COMMUNITY): Payer: 59

## 2021-05-26 DIAGNOSIS — R197 Diarrhea, unspecified: Secondary | ICD-10-CM | POA: Diagnosis not present

## 2021-05-26 DIAGNOSIS — R9431 Abnormal electrocardiogram [ECG] [EKG]: Secondary | ICD-10-CM | POA: Diagnosis not present

## 2021-05-26 DIAGNOSIS — E119 Type 2 diabetes mellitus without complications: Secondary | ICD-10-CM | POA: Diagnosis not present

## 2021-05-26 DIAGNOSIS — Z7984 Long term (current) use of oral hypoglycemic drugs: Secondary | ICD-10-CM | POA: Insufficient documentation

## 2021-05-26 DIAGNOSIS — R112 Nausea with vomiting, unspecified: Secondary | ICD-10-CM | POA: Diagnosis not present

## 2021-05-26 DIAGNOSIS — R109 Unspecified abdominal pain: Secondary | ICD-10-CM | POA: Insufficient documentation

## 2021-05-26 DIAGNOSIS — R0602 Shortness of breath: Secondary | ICD-10-CM | POA: Diagnosis not present

## 2021-05-26 LAB — URINALYSIS, ROUTINE W REFLEX MICROSCOPIC
Bacteria, UA: NONE SEEN
Bilirubin Urine: NEGATIVE
Glucose, UA: >=500 mg/dL — AB
Hgb urine dipstick: NEGATIVE
Ketones, ur: 20 mg/dL — AB
Leukocytes,Ua: NEGATIVE
Nitrite: NEGATIVE
Protein, ur: NEGATIVE mg/dL
Specific Gravity, Urine: 1.035 — ABNORMAL HIGH (ref 1.005–1.030)
pH: 7 (ref 5.0–8.0)

## 2021-05-26 LAB — COMPREHENSIVE METABOLIC PANEL
ALT: 24 U/L (ref 0–44)
AST: 20 U/L (ref 15–41)
Albumin: 4.1 g/dL (ref 3.5–5.0)
Alkaline Phosphatase: 36 U/L — ABNORMAL LOW (ref 38–126)
Anion gap: 13 (ref 5–15)
BUN: 13 mg/dL (ref 6–20)
CO2: 21 mmol/L — ABNORMAL LOW (ref 22–32)
Calcium: 8.9 mg/dL (ref 8.9–10.3)
Chloride: 103 mmol/L (ref 98–111)
Creatinine, Ser: 1.03 mg/dL (ref 0.61–1.24)
GFR, Estimated: >60 mL/min (ref >60)
Glucose, Bld: 132 mg/dL — ABNORMAL HIGH (ref 70–99)
Potassium: 3.9 mmol/L (ref 3.5–5.1)
Sodium: 137 mmol/L (ref 135–145)
Total Bilirubin: 0.8 mg/dL (ref 0.3–1.2)
Total Protein: 7 g/dL (ref 6.5–8.1)

## 2021-05-26 LAB — CBC WITH DIFFERENTIAL/PLATELET
Abs Immature Granulocytes: 0.02 10*3/uL (ref 0.00–0.07)
Basophils Absolute: 0 10*3/uL (ref 0.0–0.1)
Basophils Relative: 1 %
Eosinophils Absolute: 0 10*3/uL (ref 0.0–0.5)
Eosinophils Relative: 0 %
HCT: 48.3 % (ref 39.0–52.0)
Hemoglobin: 15.8 g/dL (ref 13.0–17.0)
Immature Granulocytes: 0 %
Lymphocytes Relative: 17 %
Lymphs Abs: 1.3 10*3/uL (ref 0.7–4.0)
MCH: 29.8 pg (ref 26.0–34.0)
MCHC: 32.7 g/dL (ref 30.0–36.0)
MCV: 91 fL (ref 80.0–100.0)
Monocytes Absolute: 0.6 10*3/uL (ref 0.1–1.0)
Monocytes Relative: 8 %
Neutro Abs: 5.6 10*3/uL (ref 1.7–7.7)
Neutrophils Relative %: 74 %
Platelets: 318 10*3/uL (ref 150–400)
RBC: 5.31 MIL/uL (ref 4.22–5.81)
RDW: 15 % (ref 11.5–15.5)
WBC: 7.6 10*3/uL (ref 4.0–10.5)
nRBC: 0 % (ref 0.0–0.2)

## 2021-05-26 LAB — BRAIN NATRIURETIC PEPTIDE: B Natriuretic Peptide: 10.5 pg/mL (ref 0.0–100.0)

## 2021-05-26 LAB — LIPASE, BLOOD: Lipase: 37 U/L (ref 11–51)

## 2021-05-26 LAB — TROPONIN I (HIGH SENSITIVITY): Troponin I (High Sensitivity): 10 ng/L (ref <18)

## 2021-05-26 MED ORDER — ONDANSETRON HCL 4 MG/2ML IJ SOLN
4.0000 mg | Freq: Once | INTRAMUSCULAR | Status: AC
  Administered 2021-05-26: 4 mg via INTRAVENOUS
  Filled 2021-05-26: qty 2

## 2021-05-26 MED ORDER — MECLIZINE HCL 25 MG PO TABS: 25.0000 mg | ORAL_TABLET | Freq: Three times a day (TID) | ORAL | 0 refills | Status: AC | PRN

## 2021-05-26 MED ORDER — SODIUM CHLORIDE 0.9 % IV BOLUS
1000.0000 mL | Freq: Once | INTRAVENOUS | Status: AC
  Administered 2021-05-26: 1000 mL via INTRAVENOUS

## 2021-05-26 NOTE — ED Notes (Signed)
Patient verbalizes understanding of d/c instructions. Opportunities for questions and answers were provided. Pt d/c from ED and ambulated to lobby with wife.  

## 2021-05-26 NOTE — ED Provider Notes (Signed)
[facility] EMERGENCY DEPARTMENT Provider Note   CSN: [id number] Arrival date & time: 05/26/21  0158     History  Chief Complaint  Patient presents with   Abdominal Pain    Douglas Reeves is a 61 y.o. male.  With a past medical history of diabetes who presents emergency department chief complaint of nausea vomiting and diarrhea.  Patient states he got off his shift this morning, ate [employer] and about 45 minutes later began having onset of nausea.  He had about 6 episodes of nonbloody nonbilious vomitus and about 3 episodes of loose stool and diarrhea.  He complains of crampy abdominal pain.  He denies fevers, chills.  Is been unable to hold down any food or fluid since around 11 AM this morning. He denies chest pain or sob.   Abdominal Pain     Home Medications Prior to Admission medications   Medication Sig Start Date End Date Taking? Authorizing Provider  meclizine (ANTIVERT) 25 MG tablet Take 1 tablet (25 mg total) by mouth 3 (three) times daily as needed for dizziness. 05/26/21  Yes Sharday Michl, PA-C  acarbose (PRECOSE) 50 MG tablet Take 1 tablet (50 mg total) by mouth 3 (three) times daily with meals. 12/04/20   Renato Shin, MD  atorvastatin (LIPITOR) 20 MG tablet TAKE 1 TABLET BY MOUTH AT BEDTIME 05/01/21   Dutch Quint B, FNP  Bromocriptine Mesylate 0.8 MG TABS Take 1 tablet (0.8 mg total) by mouth daily. 12/04/20   Renato Shin, MD  colesevelam [facility]) 625 MG tablet Take 1 tablet (625 mg total) by mouth daily. 12/04/20   Renato Shin, MD  empagliflozin (JARDIANCE) 25 MG TABS tablet Take 1 tablet (25 mg total) by mouth daily. 12/04/20   Renato Shin, MD  fenofibrate 160 MG tablet TAKE 1 TABLET BY MOUTH ONCE DAILY. NEEDS APPOINTMENT BEFORE ANYMORE REFILLS. 05/01/21   Kennyth Arnold, FNP  glimepiride (AMARYL) 4 MG tablet Take 1 tablet (4 mg total) by mouth daily with breakfast. 05/22/20   Renato Shin, MD  losartan (COZAAR) 50 MG tablet Take 0.5  tablets (25 mg total) by mouth daily. 05/28/21   McElwee, Lauren A, NP  metFORMIN (GLUCOPHAGE-XR) 500 MG 24 hr tablet Take 3 tablets (1,500 mg total) by mouth daily. 02/06/21   Renato Shin, MD  pioglitazone (ACTOS) 45 MG tablet Take 1 tablet (45 mg total) by mouth daily. 12/04/20   Renato Shin, MD  Semaglutide, 2 MG/DOSE, (OZEMPIC, 2 MG/DOSE,) 8 MG/3ML SOPN Inject 2 mg into the skin once a week. 05/20/21   Renato Shin, MD      Allergies    Patient has no known allergies.    Review of Systems   Review of Systems  Gastrointestinal:  Positive for abdominal pain.   Physical Exam Updated Vital Signs BP 117/61    Pulse 91    Temp 98.4 F (36.9 C) (Oral)    Resp 19    SpO2 98%  Physical Exam Vitals and nursing note reviewed.  Constitutional:      General: He is not in acute distress.    Appearance: He is well-developed. He is not diaphoretic.  HENT:     Head: Normocephalic and atraumatic.  Eyes:     General: No scleral icterus.    Conjunctiva/sclera: Conjunctivae normal.  Cardiovascular:     Rate and Rhythm: Normal rate and regular rhythm.     Heart sounds: Normal heart sounds.  Pulmonary:     Effort: Pulmonary effort is  normal. No respiratory distress.     Breath sounds: Normal breath sounds.  Abdominal:     General: There is no distension.     Palpations: Abdomen is soft.     Tenderness: There is no abdominal tenderness.     Hernia: No hernia is present.  Musculoskeletal:     Cervical back: Normal range of motion and neck supple.  Skin:    General: Skin is warm and dry.  Neurological:     Mental Status: He is alert.  Psychiatric:        Behavior: Behavior normal.    ED Results / Procedures / Treatments   Labs (all labs ordered are listed, but only abnormal results are displayed) Labs Reviewed  COMPREHENSIVE METABOLIC PANEL - Abnormal; Notable for the following components:      Result Value   CO2 21 (*)    Glucose, Bld 132 (*)    Alkaline Phosphatase 36 (*)    All  other components within normal limits  URINALYSIS, ROUTINE W REFLEX MICROSCOPIC - Abnormal; Notable for the following components:   Specific Gravity, Urine 1.035 (*)    Glucose, UA >=500 (*)    Ketones, ur 20 (*)    All other components within normal limits  CBC WITH DIFFERENTIAL/PLATELET  LIPASE, BLOOD  BRAIN NATRIURETIC PEPTIDE  TROPONIN I (HIGH SENSITIVITY)  TROPONIN I (HIGH SENSITIVITY)    EKG EKG Interpretation  Date/Time:  Monday May 26 2021 05:00:01 EST Ventricular Rate:  95 PR Interval:  183 QRS Duration: 83 QT Interval:  415 QTC Calculation: 522 R Axis:   22 Text Interpretation: Sinus rhythm Borderline T wave abnormalities Prolonged QT interval No old tracing to compare Confirmed by Wynona Dove ([id number]) on 05/27/2021 8:14:58 PM  Radiology No results found.  Procedures Procedures    Medications Ordered in ED Medications  sodium chloride 0.9 % bolus 1,000 mL (0 mLs Intravenous Stopped 05/26/21 0400)  ondansetron (ZOFRAN) injection 4 mg (4 mg Intravenous Given 05/26/21 0242)    ED Course/ Medical Decision Making/ A&P Clinical Course as of 05/28/21 1150  Mon May 26, 2021  0541 Urinalysis, Routine w reflex microscopic Urine, Clean Catch(!) No evidence of UTI [AH]  0551 CBC with Differential [AH]  0552 Troponin I (High Sensitivity) CBC and troponin within normal limits [AH]  0552 Comprehensive metabolic panel(!) CMP with mildly elevated glucose consistent with patient's history of diabetes no other acute finding [AH]  0552 I visualized images of a one-view portable chest x-ray: no acute findings [AH]  0553 EKG 12-Lead EKG shows normal sinus rhythm at a rate of 95.  QT is prolonged [AH]  0607 Patient reevaluated - Nausea is greatly  [AH]    Clinical Course User Index [AH] Margarita Mail, PA-C                           Medical Decision Making 61 y/o male here with pmh of dm with onset of n/v/d. Add'l hx provided by Ddx of high complexity and includes viral  gastroenteritis, bacterial gastroenteritis (food poisoning), obstruction and ACS less likely. Initially patient tacychardic on arival- consideration for significant dehydration- no evidence of AKI. Placed on cardiac monitoring which shows sinus tachycardia. Patient given zofran and fluid hydration with resolution of his vomiting and nausea. Tolerating PO fluids and feeling greatly improved. ECG shows prolonged QT - will need discontinuation of all QT prolonging medications.  Discussed  need for repeat EKG to make sure QT length  is improving. Suggest replacing electrolytes and magnesium. I considered the potential for observation or admission however after evaluation and observation in the ED feel the patient may be discharged with strict return precautions and close OP f/u  Problems Addressed: Nausea vomiting and diarrhea: complicated acute illness or injury Prolonged Q-T interval on ECG: complicated acute illness or injury  Amount and/or Complexity of Data Reviewed Independent Historian: spouse Labs: ordered. Decision-making details documented in ED Course. Radiology: ordered and independent interpretation performed. Decision-making details documented in ED Course. ECG/medicine tests: ordered and independent interpretation performed. Decision-making details documented in ED Course.  Risk Prescription drug management.            Final Clinical Impression(s) / ED Diagnoses Final diagnoses:  Nausea vomiting and diarrhea  Prolonged Q-T interval on ECG    Rx / DC Orders ED Discharge Orders          Ordered    meclizine (ANTIVERT) 25 MG tablet  3 times daily PRN        05/26/21 0641              Margarita Mail, PA-C 05/28/21 1205    Betsey Holiday Gwenyth Allegra, MD 05/31/21 [phone]

## 2021-05-26 NOTE — ED Triage Notes (Signed)
Pt c/o nausea/vomiting/diarrhea and abdominal pain that started tonight.

## 2021-05-26 NOTE — Discharge Instructions (Addendum)
As discussed the QT or interval on your EKG was elongated.  You will need to get a repeat EKG within the next week.  Continue frequent small sips (10-20 ml) of clear liquids every 5-10 minutes. Gatorade or powerade are good options. Avoid milk, orange juice, and grape juice for now. . Once you have not had further vomiting with the small sips for 4 hours, you may begin to drink larger volumes of fluids at a time and try a bland diet which may include saltine crackers, applesauce, breads, pastas, bananas, bland chicken. If you continues to vomit despite medication, return to the ED for repeat evaluation. Otherwise, follow up with your doctor in 2-3 days for a re-check.

## 2021-05-27 ENCOUNTER — Other Ambulatory Visit: Payer: Self-pay

## 2021-05-27 NOTE — Progress Notes (Signed)
Established Patient Office Visit  Subjective:  Patient ID: Douglas Reeves, male    DOB: 25-Dec-1960  Age: 61 y.o. MRN: [id number]  CC:  Chief Complaint  Patient presents with   Transitions Of Care    Est care. Hosp f/u. No main concerns    HPI Douglas Reeves presents to transfer care to a new provider.  Introduced to Designer, jewellery role and practice setting.  All questions answered.  Discussed provider/patient relationship and expectations.  He recently went to the ER on 05/26/21 for nausea, vomiting, and diarrhea. He was diagnosed with a stomach virus and given medication for nausea and vomiting. Since then, his symptoms have resolved and he denies abdominal pain, nausea, vomiting, and diarrhea.   DIABETES  Hypoglycemic episodes:no Polydipsia/polyuria: no Visual disturbance: no Chest pain: no Paresthesias: no Glucose Monitoring: yes  Accucheck frequency: Daily  Fasting glucose: 110 this morning  Post prandial:  Evening:  Before meals: Taking Insulin?: no  Long acting insulin:  Short acting insulin: Blood Pressure Monitoring: not checking Retinal Examination: Not up to Date Foot Exam: Up to Date Diabetic Education: Completed Pneumovax: Up to Date Influenza: Up to Date Aspirin: no   Past Medical History:  Diagnosis Date   Diabetes mellitus    Hyperlipidemia    SBO (small bowel obstruction) ([place]) 03/30/2018    Past Surgical History:  Procedure Laterality Date   APPENDECTOMY  1972   BACK SURGERY  1973   VASECTOMY  2000    Family History  Problem Relation Age of Onset   Diabetes Brother    Colon cancer Neg Hx     Social History   Socioeconomic History   Marital status: Married    Spouse name: Not on file   Number of children: Not on file   Years of education: Not on file   Highest education level: Not on file  Occupational History   Not on file  Tobacco Use   Smoking status: Never   Smokeless tobacco: Never  Vaping Use   Vaping Use:  Never used  Substance and Sexual Activity   Alcohol use: No    Alcohol/week: 0.0 standard drinks   Drug use: No   Sexual activity: Not on file  Other Topics Concern   Not on file  Social History Narrative   Not on file   Social Determinants of Health   Financial Resource Strain: Not on file  Food Insecurity: Not on file  Transportation Needs: Not on file  Physical Activity: Not on file  Stress: Not on file  Social Connections: Not on file  Intimate Partner Violence: Not on file    Outpatient Medications Prior to Visit  Medication Sig Dispense Refill   acarbose (PRECOSE) 50 MG tablet Take 1 tablet (50 mg total) by mouth 3 (three) times daily with meals. 270 tablet 3   atorvastatin (LIPITOR) 20 MG tablet TAKE 1 TABLET BY MOUTH AT BEDTIME 90 tablet 0   Bromocriptine Mesylate 0.8 MG TABS Take 1 tablet (0.8 mg total) by mouth daily. 90 tablet 3   colesevelam (WELCHOL) 625 MG tablet Take 1 tablet (625 mg total) by mouth daily. 90 tablet 3   empagliflozin (JARDIANCE) 25 MG TABS tablet Take 1 tablet (25 mg total) by mouth daily. 90 tablet 3   fenofibrate 160 MG tablet TAKE 1 TABLET BY MOUTH ONCE DAILY. NEEDS APPOINTMENT BEFORE ANYMORE REFILLS. 90 tablet 0   glimepiride (AMARYL) 4 MG tablet Take 1 tablet (4 mg total) by mouth daily  with breakfast. 90 tablet 3   meclizine (ANTIVERT) 25 MG tablet Take 1 tablet (25 mg total) by mouth 3 (three) times daily as needed for dizziness. 30 tablet 0   metFORMIN (GLUCOPHAGE-XR) 500 MG 24 hr tablet Take 3 tablets (1,500 mg total) by mouth daily. 270 tablet 3   pioglitazone (ACTOS) 45 MG tablet Take 1 tablet (45 mg total) by mouth daily. 90 tablet 3   Semaglutide, 2 MG/DOSE, (OZEMPIC, 2 MG/DOSE,) 8 MG/3ML SOPN Inject 2 mg into the skin once a week. 9 mL 3   No facility-administered medications prior to visit.    No Known Allergies  ROS Review of Systems  Constitutional: Negative.   HENT: Negative.    Eyes: Negative.   Respiratory: Negative.     Cardiovascular: Negative.   Gastrointestinal: Negative.   Genitourinary: Negative.   Musculoskeletal: Negative.   Skin: Negative.   Neurological: Negative.   Psychiatric/Behavioral: Negative.       Objective:    Physical Exam Vitals and nursing note reviewed.  Constitutional:      Appearance: Normal appearance.  HENT:     Head: Normocephalic.     Right Ear: Tympanic membrane, ear canal and external ear normal.     Left Ear: Tympanic membrane, ear canal and external ear normal.  Eyes:     Conjunctiva/sclera: Conjunctivae normal.  Cardiovascular:     Rate and Rhythm: Normal rate and regular rhythm.     Pulses: Normal pulses.     Heart sounds: Normal heart sounds.  Pulmonary:     Effort: Pulmonary effort is normal.     Breath sounds: Normal breath sounds.  Abdominal:     Palpations: Abdomen is soft.     Tenderness: There is no abdominal tenderness.  Musculoskeletal:     Cervical back: Normal range of motion. No tenderness.  Lymphadenopathy:     Cervical: No cervical adenopathy.  Skin:    General: Skin is warm and dry.  Neurological:     General: No focal deficit present.     Mental Status: He is alert and oriented to person, place, and time.  Psychiatric:        Mood and Affect: Mood normal.        Behavior: Behavior normal.        Thought Content: Thought content normal.        Judgment: Judgment normal.    BP 138/82 (BP Location: Left Arm, Patient Position: Sitting, Cuff Size: Large)    Pulse 95    Temp (!) 96.4 F (35.8 C) (Temporal)    Ht 6\' 2"  (1.88 m)    Wt 289 lb 9.6 oz (131.4 kg)    SpO2 97%    BMI 37.18 kg/m  Wt Readings from Last 3 Encounters:  05/28/21 289 lb 9.6 oz (131.4 kg)  05/20/21 290 lb 6.4 oz (131.7 kg)  02/05/21 288 lb 6.4 oz (130.8 kg)     Health Maintenance Due  Topic Date Due   Hepatitis C Screening  Never done   Zoster Vaccines- Shingrix (1 of 2) Never done   COVID-19 Vaccine (3 - Booster for Pfizer series) 01/06/2020    OPHTHALMOLOGY EXAM  07/26/2020   URINE MICROALBUMIN  11/20/2020    There are no preventive care reminders to display for this patient.  Lab Results  Component Value Date   TSH 2.67 02/05/2021   Lab Results  Component Value Date   WBC 7.6 05/26/2021   HGB 15.8 05/26/2021   HCT 48.3  05/26/2021   MCV 91.0 05/26/2021   PLT 318 05/26/2021   Lab Results  Component Value Date   NA 137 05/26/2021   K 3.9 05/26/2021   CO2 21 (L) 05/26/2021   GLUCOSE 132 (H) 05/26/2021   BUN 13 05/26/2021   CREATININE 1.03 05/26/2021   BILITOT 0.8 05/26/2021   ALKPHOS 36 (L) 05/26/2021   AST 20 05/26/2021   ALT 24 05/26/2021   PROT 7.0 05/26/2021   ALBUMIN 4.1 05/26/2021   CALCIUM 8.9 05/26/2021   ANIONGAP 13 05/26/2021   GFR 58.95 (L) 02/05/2021   Lab Results  Component Value Date   CHOL 116 11/21/2019   Lab Results  Component Value Date   HDL 50.50 11/21/2019   Lab Results  Component Value Date   LDLCALC 52 11/21/2019   Lab Results  Component Value Date   TRIG 68.0 11/21/2019   Lab Results  Component Value Date   CHOLHDL 2 11/21/2019   Lab Results  Component Value Date   HGBA1C 8.3 (A) 05/20/2021      Assessment & Plan:   Problem List Items Addressed This Visit       Endocrine   Hyperlipidemia associated with type 2 diabetes mellitus ([place])    Chronic, ongoing. He is unsure of the medications he is taking, however his medication list states atorvastatin, welchol, and fenofibrate. Will have him confirm what medications he is taking and check lipid panel at his next visit.       Relevant Medications   losartan (COZAAR) 50 MG tablet   Type 2 diabetes mellitus with obesity ([place]) - Primary    Chronic, not controlled. Follows with Dr. Loanne Drilling with endocrinology. His last A1C was 8.3% on 05/20/21 however it was noted that he was having trouble getting his trulicity due to backorders at the pharmacy. He was switched to ozempic. He is up to date on his foot exam. Discussed  annual eye exam and he will call them to schedule an appointment. He is not currently on ACE-I/ARB for kidney protection and he does not recall ever being on this medication. Will start losartan 25mg  daily and check urine microalbumin. Follow up in 4 weeks to repeat labs.       Relevant Medications   losartan (COZAAR) 50 MG tablet   Other Relevant Orders   Microalbumin / creatinine urine ratio     Other   Morbid obesity ([facility])    BMI 37.1, however he does have co-morbidities including type 2 diabetes mellitus and hyperlipidemia. Discussed diet and exercise. Goal is to lose 1-2 pounds per week.        Meds ordered this encounter  Medications   losartan (COZAAR) 50 MG tablet    Sig: Take 0.5 tablets (25 mg total) by mouth daily.    Dispense:  45 tablet    Refill:  0    Follow-up: Return in about 4 weeks (around 06/25/2021) for Diabetes, fasting.    Charyl Dancer, NP

## 2021-05-28 ENCOUNTER — Ambulatory Visit: Payer: 59 | Admitting: Nurse Practitioner

## 2021-05-28 ENCOUNTER — Encounter: Payer: Self-pay | Admitting: Nurse Practitioner

## 2021-05-28 VITALS — BP 138/82 | HR 95 | Temp 96.4°F | Ht 74.0 in | Wt 289.6 lb

## 2021-05-28 DIAGNOSIS — E1169 Type 2 diabetes mellitus with other specified complication: Secondary | ICD-10-CM

## 2021-05-28 DIAGNOSIS — E785 Hyperlipidemia, unspecified: Secondary | ICD-10-CM

## 2021-05-28 DIAGNOSIS — E669 Obesity, unspecified: Secondary | ICD-10-CM | POA: Diagnosis not present

## 2021-05-28 LAB — MICROALBUMIN / CREATININE URINE RATIO
Creatinine,U: 140.9 mg/dL
Microalb Creat Ratio: 0.5 mg/g (ref 0.0–30.0)
Microalb, Ur: <0.7 mg/dL (ref 0.0–1.9)

## 2021-05-28 MED ORDER — LOSARTAN POTASSIUM 50 MG PO TABS: 25.0000 mg | ORAL_TABLET | Freq: Every day | ORAL | 0 refills | Status: DC

## 2021-05-28 NOTE — Assessment & Plan Note (Signed)
BMI 37.1, however he does have co-morbidities including type 2 diabetes mellitus and hyperlipidemia. Discussed diet and exercise. Goal is to lose 1-2 pounds per week.

## 2021-05-28 NOTE — Assessment & Plan Note (Signed)
Chronic, not controlled. Follows with Dr. Loanne Drilling with endocrinology. His last A1C was 8.3% on 05/20/21 however it was noted that he was having trouble getting his trulicity due to backorders at the pharmacy. He was switched to ozempic. He is up to date on his foot exam. Discussed annual eye exam and he will call them to schedule an appointment. He is not currently on ACE-I/ARB for kidney protection and he does not recall ever being on this medication. Will start losartan 25mg  daily and check urine microalbumin. Follow up in 4 weeks to repeat labs.

## 2021-05-28 NOTE — Patient Instructions (Signed)
It was great to see you!  Start 1/2 tablet of losartan once a day. Keep checking your blood sugars and write them down. Call your eye doctor to schedule a yearly exam.   Let's follow-up in 1 month, sooner if you have concerns.  If a referral was placed today, you will be contacted for an appointment. Please note that routine referrals can sometimes take up to 3-4 weeks to process. Please call our office if you haven't heard anything after this time frame.  Take care,  Vance Peper, NP

## 2021-05-28 NOTE — Assessment & Plan Note (Signed)
Chronic, ongoing. He is unsure of the medications he is taking, however his medication list states atorvastatin, welchol, and fenofibrate. Will have him confirm what medications he is taking and check lipid panel at his next visit.

## 2021-06-11 ENCOUNTER — Ambulatory Visit: Payer: 59 | Admitting: Endocrinology

## 2021-06-24 NOTE — Progress Notes (Signed)
? ?Established Patient Office Visit ? ?Subjective:  ?Patient ID: Douglas Reeves, male    DOB: Dec 06, 1960  Age: 61 y.o. MRN: [id number] ? ?CC:  ?Chief Complaint  ?Patient presents with  ? Follow-up  ?  4 wk f/u DM. Bs ranges 100-120  ? ? ?HPI ?Jaymes Graff presents for follow-up on diabetes. He was started last visit on losartan 12.'5mg'$  daily for kidney protection with diabetes. He started this medication and is not having any side effects. He does not check his blood pressure at home. He states his sugars have been well controlled, ranging from 100-120 in the mornings. He was changed from trulicity to ozempic due a drug shortage, however he states that ozempic made his sick and he stopped it. This happened a few months ago. He has an appointment with Dr. Loanne Drilling, his endocrinologist, next month. He denies chest pain, shortness of breath, and headaches.  ? ?Past Medical History:  ?Diagnosis Date  ? Diabetes mellitus   ? Hyperlipidemia   ? SBO (small bowel obstruction) ([place]) 03/30/2018  ? ? ?Past Surgical History:  ?Procedure Laterality Date  ? APPENDECTOMY  1972  ? [place]  ? VASECTOMY  2000  ? ? ?Family History  ?Problem Relation Age of Onset  ? Diabetes Brother   ? Colon cancer Neg Hx   ? ? ?Social History  ? ?Socioeconomic History  ? Marital status: Married  ?  Spouse name: Not on file  ? Number of children: Not on file  ? Years of education: Not on file  ? Highest education level: Not on file  ?Occupational History  ? Not on file  ?Tobacco Use  ? Smoking status: Never  ? Smokeless tobacco: Never  ?Vaping Use  ? Vaping Use: Never used  ?Substance and Sexual Activity  ? Alcohol use: No  ?  Alcohol/week: 0.0 standard drinks  ? Drug use: No  ? Sexual activity: Not on file  ?Other Topics Concern  ? Not on file  ?Social History Narrative  ? Not on file  ? ?Social Determinants of Health  ? ?Financial Resource Strain: Not on file  ?Food Insecurity: Not on file  ?Transportation Needs: Not on file  ?Physical  Activity: Not on file  ?Stress: Not on file  ?Social Connections: Not on file  ?Intimate Partner Violence: Not on file  ? ? ?Outpatient Medications Prior to Visit  ?Medication Sig Dispense Refill  ? acarbose (PRECOSE) 50 MG tablet Take 1 tablet (50 mg total) by mouth 3 (three) times daily with meals. 270 tablet 3  ? atorvastatin (LIPITOR) 20 MG tablet TAKE 1 TABLET BY MOUTH AT BEDTIME 90 tablet 0  ? Bromocriptine Mesylate 0.8 MG TABS Take 1 tablet (0.8 mg total) by mouth daily. 90 tablet 3  ? colesevelam (WELCHOL) 625 MG tablet Take 1 tablet (625 mg total) by mouth daily. 90 tablet 3  ? empagliflozin (JARDIANCE) 25 MG TABS tablet Take 1 tablet (25 mg total) by mouth daily. 90 tablet 3  ? fenofibrate 160 MG tablet TAKE 1 TABLET BY MOUTH ONCE DAILY. NEEDS APPOINTMENT BEFORE ANYMORE REFILLS. 90 tablet 0  ? glimepiride (AMARYL) 4 MG tablet Take 1 tablet (4 mg total) by mouth daily with breakfast. 90 tablet 3  ? losartan (COZAAR) 50 MG tablet Take 0.5 tablets (25 mg total) by mouth daily. 45 tablet 0  ? meclizine (ANTIVERT) 25 MG tablet Take 1 tablet (25 mg total) by mouth 3 (three) times daily as needed for dizziness.  30 tablet 0  ? metFORMIN (GLUCOPHAGE-XR) 500 MG 24 hr tablet Take 3 tablets (1,500 mg total) by mouth daily. 270 tablet 3  ? pioglitazone (ACTOS) 45 MG tablet Take 1 tablet (45 mg total) by mouth daily. 90 tablet 3  ? Semaglutide, 2 MG/DOSE, (OZEMPIC, 2 MG/DOSE,) 8 MG/3ML SOPN Inject 2 mg into the skin once a week. (Patient not taking: Reported on 06/25/2021) 9 mL 3  ? ?No facility-administered medications prior to visit.  ? ? ?No Known Allergies ? ?ROS ?Review of Systems ?See pertinent positives and negatives per HPI. ?  ?Objective:  ?  ?Physical Exam ?Vitals and nursing note reviewed.  ?Constitutional:   ?   Appearance: Normal appearance.  ?HENT:  ?   Head: Normocephalic.  ?Cardiovascular:  ?   Rate and Rhythm: Normal rate and regular rhythm.  ?   Pulses: Normal pulses.  ?   Heart sounds: Normal heart  sounds.  ?Pulmonary:  ?   Effort: Pulmonary effort is normal.  ?   Breath sounds: Normal breath sounds.  ?Abdominal:  ?   Palpations: Abdomen is soft.  ?   Tenderness: There is no abdominal tenderness.  ?Musculoskeletal:  ?   Cervical back: Normal range of motion.  ?   Right lower leg: No edema.  ?   Left lower leg: No edema.  ?Skin: ?   General: Skin is warm and dry.  ?Neurological:  ?   General: No focal deficit present.  ?   Mental Status: He is alert and oriented to person, place, and time.  ?Psychiatric:     ?   Mood and Affect: Mood normal.     ?   Behavior: Behavior normal.     ?   Thought Content: Thought content normal.     ?   Judgment: Judgment normal.  ? ? ?BP (!) 150/70 (BP Location: Right Arm, Cuff Size: Large)   Pulse (!) 103   Temp 98.7 ?F (37.1 ?C) (Temporal)   Wt 285 lb 3.2 oz (129.4 kg)   SpO2 96%   BMI 36.62 kg/m?  ?Wt Readings from Last 3 Encounters:  ?06/25/21 285 lb 3.2 oz (129.4 kg)  ?05/28/21 289 lb 9.6 oz (131.4 kg)  ?05/20/21 290 lb 6.4 oz (131.7 kg)  ? ? ? ?Health Maintenance Due  ?Topic Date Due  ? Hepatitis C Screening  Never done  ? OPHTHALMOLOGY EXAM  07/26/2020  ? TETANUS/TDAP  06/04/2021  ? ? ?There are no preventive care reminders to display for this patient. ? ?Lab Results  ?Component Value Date  ? TSH 2.67 02/05/2021  ? ?Lab Results  ?Component Value Date  ? WBC 7.6 05/26/2021  ? HGB 15.8 05/26/2021  ? HCT 48.3 05/26/2021  ? MCV 91.0 05/26/2021  ? PLT 318 05/26/2021  ? ?Lab Results  ?Component Value Date  ? NA 137 05/26/2021  ? K 3.9 05/26/2021  ? CO2 21 (L) 05/26/2021  ? GLUCOSE 132 (H) 05/26/2021  ? BUN 13 05/26/2021  ? CREATININE 1.03 05/26/2021  ? BILITOT 0.8 05/26/2021  ? ALKPHOS 36 (L) 05/26/2021  ? AST 20 05/26/2021  ? ALT 24 05/26/2021  ? PROT 7.0 05/26/2021  ? ALBUMIN 4.1 05/26/2021  ? CALCIUM 8.9 05/26/2021  ? ANIONGAP 13 05/26/2021  ? GFR 58.95 (L) 02/05/2021  ? ?Lab Results  ?Component Value Date  ? CHOL 116 11/21/2019  ? ?Lab Results  ?Component Value Date  ?  HDL 50.50 11/21/2019  ? ?Lab Results  ?Component Value Date  ?  [place] 52 11/21/2019  ? ?Lab Results  ?Component Value Date  ? TRIG 68.0 11/21/2019  ? ?Lab Results  ?Component Value Date  ? CHOLHDL 2 11/21/2019  ? ?Lab Results  ?Component Value Date  ? HGBA1C 8.3 (A) 05/20/2021  ? ? ?  ?Assessment & Plan:  ? ?Problem List Items Addressed This Visit   ? ?  ? Endocrine  ? Hyperlipidemia associated with type 2 diabetes mellitus ([place])  ?  Chronic, stable. Continue atorvastatin daily. Check lipid panel and adjust regimen as needed.  ?  ?  ? Relevant Orders  ? Lipid panel  ? Type 2 diabetes mellitus with obesity ([place]) - Primary  ?  He was started on losartan 12.'5mg'$  daily for kidney protection. He has not had any side effects. Check BMP today. He has an appointment with Dr. Loanne Drilling, endocrinologist, next month. He states his eye doctor is running behind in appointment and he has one scheduled in August. Encouraged him to keep his appointment with Dr. Loanne Drilling. Follow-up in 3 months.  ?  ?  ? Relevant Orders  ? Basic Metabolic Panel (BMET)  ?  ? Other  ? Elevated blood pressure reading  ?  BP elevated today at 150/70. Was in normal range last visit. Discussed limiting the amount of salt in his diet, which he says he loves and eats a lot of. Checking BMP today as he was started on losartan last visit. Encouraged him to check his blood pressure at home. Consider increasing losartan if blood pressure still elevated.  ?  ?  ? ?Other Visit Diagnoses   ? ? Encounter for hepatitis C screening test for low risk patient      ? screen for hepatitis C today  ? Relevant Orders  ? Hepatitis C antibody  ? ?  ? ? ?No orders of the defined types were placed in this encounter. ? ? ?Follow-up: Return in about 3 months (around 09/25/2021) for HTN.  ? ? ?Charyl Dancer, NP ?

## 2021-06-25 ENCOUNTER — Encounter: Payer: Self-pay | Admitting: Nurse Practitioner

## 2021-06-25 ENCOUNTER — Ambulatory Visit: Payer: 59 | Admitting: Nurse Practitioner

## 2021-06-25 ENCOUNTER — Other Ambulatory Visit: Payer: Self-pay

## 2021-06-25 VITALS — BP 150/70 | HR 103 | Temp 98.7°F | Wt 285.2 lb

## 2021-06-25 DIAGNOSIS — Z1159 Encounter for screening for other viral diseases: Secondary | ICD-10-CM | POA: Diagnosis not present

## 2021-06-25 DIAGNOSIS — E785 Hyperlipidemia, unspecified: Secondary | ICD-10-CM

## 2021-06-25 DIAGNOSIS — E1169 Type 2 diabetes mellitus with other specified complication: Secondary | ICD-10-CM | POA: Diagnosis not present

## 2021-06-25 DIAGNOSIS — E669 Obesity, unspecified: Secondary | ICD-10-CM

## 2021-06-25 DIAGNOSIS — R030 Elevated blood-pressure reading, without diagnosis of hypertension: Secondary | ICD-10-CM | POA: Insufficient documentation

## 2021-06-25 NOTE — Assessment & Plan Note (Signed)
Chronic, stable. Continue atorvastatin daily. Check lipid panel and adjust regimen as needed.  ?

## 2021-06-25 NOTE — Assessment & Plan Note (Signed)
He was started on losartan 12.'5mg'$  daily for kidney protection. He has not had any side effects. Check BMP today. He has an appointment with Dr. Loanne Drilling, endocrinologist, next month. He states his eye doctor is running behind in appointment and he has one scheduled in August. Encouraged him to keep his appointment with Dr. Loanne Drilling. Follow-up in 3 months.  ?

## 2021-06-25 NOTE — Patient Instructions (Signed)
It was great to see you! ? ?Limit the amount of salt that you are eating in your diet.  Check your blood pressure at home and write it down if you are able to.  Make sure to keep your appointment with Dr. Loanne Drilling next month.  If your blood pressure is still elevated at that visit he may increase your losartan. ? ?We are going to check your labs and will let you know the results. ? ?Let's follow-up in 3 months, sooner if you have concerns. ? ?If a referral was placed today, you will be contacted for an appointment. Please note that routine referrals can sometimes take up to 3-4 weeks to process. Please call our office if you haven't heard anything after this time frame. ? ?Take care, ? ?Vance Peper, NP ? ? ?

## 2021-06-25 NOTE — Assessment & Plan Note (Signed)
BP elevated today at 150/70. Was in normal range last visit. Discussed limiting the amount of salt in his diet, which he says he loves and eats a lot of. Checking BMP today as he was started on losartan last visit. Encouraged him to check his blood pressure at home. Consider increasing losartan if blood pressure still elevated.  ?

## 2021-06-26 LAB — HEPATITIS C ANTIBODY
Hepatitis C Ab: NONREACTIVE
SIGNAL TO CUT-OFF: 0.04 (ref <1.00)

## 2021-06-26 LAB — LIPID PANEL
Cholesterol: 136 mg/dL (ref 0–200)
HDL: 58.4 mg/dL (ref >39.00)
LDL Cholesterol: 60 mg/dL (ref 0–99)
NonHDL: 77.38
Total CHOL/HDL Ratio: 2
Triglycerides: 87 mg/dL (ref 0.0–149.0)
VLDL: 17.4 mg/dL (ref 0.0–40.0)

## 2021-06-26 LAB — BASIC METABOLIC PANEL
BUN: 23 mg/dL (ref 6–23)
CO2: 24 mEq/L (ref 19–32)
Calcium: 9.5 mg/dL (ref 8.4–10.5)
Chloride: 100 mEq/L (ref 96–112)
Creatinine, Ser: 1.44 mg/dL (ref 0.40–1.50)
GFR: 52.96 mL/min — ABNORMAL LOW (ref >60.00)
Glucose, Bld: 149 mg/dL — ABNORMAL HIGH (ref 70–99)
Potassium: 3.7 mEq/L (ref 3.5–5.1)
Sodium: 135 mEq/L (ref 135–145)

## 2021-06-27 NOTE — Addendum Note (Signed)
Addended by: Vance Peper A on: 06/27/2021 09:33 AM ? ? Modules accepted: Orders ? ?

## 2021-07-01 NOTE — Progress Notes (Signed)
Called and informed patient of results and provider instructions. Patient voiced understanding. Scheduled pt for lab visit 04/05 '@3pm'$ 

## 2021-07-14 ENCOUNTER — Other Ambulatory Visit: Payer: Self-pay | Admitting: Endocrinology

## 2021-07-16 ENCOUNTER — Other Ambulatory Visit (INDEPENDENT_AMBULATORY_CARE_PROVIDER_SITE_OTHER): Payer: 59

## 2021-07-16 DIAGNOSIS — E669 Obesity, unspecified: Secondary | ICD-10-CM

## 2021-07-16 DIAGNOSIS — E1169 Type 2 diabetes mellitus with other specified complication: Secondary | ICD-10-CM | POA: Diagnosis not present

## 2021-07-17 LAB — BASIC METABOLIC PANEL
BUN: 12 mg/dL (ref 6–23)
CO2: 25 mEq/L (ref 19–32)
Calcium: 9.2 mg/dL (ref 8.4–10.5)
Chloride: 102 mEq/L (ref 96–112)
Creatinine, Ser: 1.09 mg/dL (ref 0.40–1.50)
GFR: 73.94 mL/min (ref >60.00)
Glucose, Bld: 152 mg/dL — ABNORMAL HIGH (ref 70–99)
Potassium: 4 mEq/L (ref 3.5–5.1)
Sodium: 135 mEq/L (ref 135–145)

## 2021-07-20 ENCOUNTER — Other Ambulatory Visit: Payer: Self-pay | Admitting: Endocrinology

## 2021-07-21 ENCOUNTER — Other Ambulatory Visit: Payer: Self-pay | Admitting: Endocrinology

## 2021-07-27 ENCOUNTER — Other Ambulatory Visit: Payer: Self-pay | Admitting: Family

## 2021-07-27 DIAGNOSIS — E781 Pure hyperglyceridemia: Secondary | ICD-10-CM

## 2021-08-02 ENCOUNTER — Other Ambulatory Visit: Payer: Self-pay | Admitting: Family

## 2021-08-02 DIAGNOSIS — E781 Pure hyperglyceridemia: Secondary | ICD-10-CM

## 2021-08-07 ENCOUNTER — Ambulatory Visit: Payer: 59 | Admitting: Endocrinology

## 2021-08-07 VITALS — BP 142/76 | HR 86 | Ht 74.0 in | Wt 290.2 lb

## 2021-08-07 DIAGNOSIS — E1169 Type 2 diabetes mellitus with other specified complication: Secondary | ICD-10-CM | POA: Diagnosis not present

## 2021-08-07 DIAGNOSIS — E119 Type 2 diabetes mellitus without complications: Secondary | ICD-10-CM

## 2021-08-07 DIAGNOSIS — E669 Obesity, unspecified: Secondary | ICD-10-CM | POA: Diagnosis not present

## 2021-08-07 LAB — POCT GLYCOSYLATED HEMOGLOBIN (HGB A1C): Hemoglobin A1C: 8.9 % — AB (ref 4.0–5.6)

## 2021-08-07 MED ORDER — BROMOCRIPTINE MESYLATE 0.8 MG PO TABS: 1.0000 | ORAL_TABLET | Freq: Every day | ORAL | 1 refills | Status: DC

## 2021-08-07 MED ORDER — ACARBOSE 50 MG PO TABS: 50.0000 mg | ORAL_TABLET | Freq: Three times a day (TID) | ORAL | 3 refills | Status: DC

## 2021-08-07 MED ORDER — COLESEVELAM HCL 625 MG PO TABS: 625.0000 mg | ORAL_TABLET | Freq: Every day | ORAL | 3 refills | Status: DC

## 2021-08-07 MED ORDER — PIOGLITAZONE HCL 45 MG PO TABS: 45.0000 mg | ORAL_TABLET | Freq: Every day | ORAL | 3 refills | Status: DC

## 2021-08-07 MED ORDER — OZEMPIC (0.25 OR 0.5 MG/DOSE) 2 MG/1.5ML ~~LOC~~ SOPN: 0.5000 mg | PEN_INJECTOR | SUBCUTANEOUS | 3 refills | Status: DC

## 2021-08-07 MED ORDER — EMPAGLIFLOZIN 25 MG PO TABS: 25.0000 mg | ORAL_TABLET | Freq: Every day | ORAL | 3 refills | Status: DC

## 2021-08-07 MED ORDER — GLIMEPIRIDE 4 MG PO TABS: 4.0000 mg | ORAL_TABLET | Freq: Every day | ORAL | 1 refills | Status: DC

## 2021-08-07 NOTE — Patient Instructions (Addendum)
check your blood sugar once a day.  vary the time of day when you check, between before the 3 meals, and at bedtime.  also check if you have symptoms of your blood sugar being too high or too low.  please keep a record of the readings and bring it to your next appointment here (or you can bring the meter itself).  You can write it on any piece of paper.  please call us sooner if your blood sugar goes below 70, or if you have a lot of readings over 200.   ?I have sent a prescription to your pharmacy, to resume the Ozempic at a lower dosage.   ?Please continue the same other diabetes medications.   ?You should have an endocrinology follow-up appointment in 2-3 months.   ?

## 2021-08-07 NOTE — Progress Notes (Signed)
? ?Subjective:  ? ? Patient ID: Douglas Reeves, male    DOB: July 24, 1960, 61 y.o.   MRN: [id number] ? ?HPI ?Pt returns for f/u of diabetes mellitus:   ?DM type: 2 ?Dx'ed: 2003 ?Complications: none ?Therapy: Trulicity and 6 oral meds.   ?DKA: never ?Severe hypoglycemia: never ?Pancreatitis: never ?Pancreatic imaging: normal on 2019 CT.   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   ?Other:  he has never been on insulin, and he declines.   ?Interval history: no cbg record, but states cbg's vary from 90-280.  pt states he feels well in general.  He stopped Ozempic 2 mg q week, due to N/V.  He takes other meds as rx'ed.   ?Past Medical History:  ?Diagnosis Date  ? Diabetes mellitus   ? Hyperlipidemia   ? SBO (small bowel obstruction) ([place]) 03/30/2018  ? ? ?Past Surgical History:  ?Procedure Laterality Date  ? APPENDECTOMY  1972  ? [place]  ? VASECTOMY  2000  ? ? ?Social History  ? ?Socioeconomic History  ? Marital status: Married  ?  Spouse name: Not on file  ? Number of children: Not on file  ? Years of education: Not on file  ? Highest education level: Not on file  ?Occupational History  ? Not on file  ?Tobacco Use  ? Smoking status: Never  ? Smokeless tobacco: Never  ?Vaping Use  ? Vaping Use: Never used  ?Substance and Sexual Activity  ? Alcohol use: No  ?  Alcohol/week: 0.0 standard drinks  ? Drug use: No  ? Sexual activity: Not on file  ?Other Topics Concern  ? Not on file  ?Social History Narrative  ? Not on file  ? ?Social Determinants of Health  ? ?Financial Resource Strain: Not on file  ?Food Insecurity: Not on file  ?Transportation Needs: Not on file  ?Physical Activity: Not on file  ?Stress: Not on file  ?Social Connections: Not on file  ?Intimate Partner Violence: Not on file  ? ? ?Current Outpatient Medications on File Prior to Visit  ?Medication Sig Dispense Refill  ? atorvastatin (LIPITOR) 20 MG tablet TAKE 1 TABLET BY MOUTH AT BEDTIME 90 tablet 0  ? fenofibrate 160 MG tablet TAKE 1 TABLET BY  MOUTH ONCE DAILY. NEEDS APPOINTMENT BEFORE ANYMORE REFILLS. 90 tablet 0  ? losartan (COZAAR) 50 MG tablet Take 0.5 tablets (25 mg total) by mouth daily. 45 tablet 0  ? meclizine (ANTIVERT) 25 MG tablet Take 1 tablet (25 mg total) by mouth 3 (three) times daily as needed for dizziness. 30 tablet 0  ? ?No current facility-administered medications on file prior to visit.  ? ? ?No Known Allergies ? ?Family History  ?Problem Relation Age of Onset  ? Diabetes Brother   ? Colon cancer Neg Hx   ? ? ?BP (!) 142/76 (BP Location: Left Arm, Patient Position: Sitting, Cuff Size: Normal)   Pulse 86   Ht '6\' 2"'$  (1.88 m)   Wt 290 lb 3.2 oz (131.6 kg)   SpO2 96%   BMI 37.26 kg/m?  ? ? ?Review of Systems ? ?   ?Objective:  ? Physical Exam ?VITAL SIGNS:  See vs page. ?GENERAL: no distress ? ? ?Lab Results  ?Component Value Date  ? HGBA1C 8.9 (A) 08/07/2021  ? ?   ?Assessment & Plan:  ?Type 2 DM: uncontrolled.  ? ?Patient Instructions  ?check your blood sugar once a day.  vary the time of day when  you check, between before the 3 meals, and at bedtime.  also check if you have symptoms of your blood sugar being too high or too low.  please keep a record of the readings and bring it to your next appointment here (or you can bring the meter itself).  You can write it on any piece of paper.  please call us sooner if your blood sugar goes below 70, or if you have a lot of readings over 200.   ?I have sent a prescription to your pharmacy, to resume the Ozempic at a lower dosage.   ?Please continue the same other diabetes medications.   ?You should have an endocrinology follow-up appointment in 2-3 months.   ? ? ?

## 2021-09-07 ENCOUNTER — Other Ambulatory Visit: Payer: Self-pay | Admitting: Family

## 2021-09-24 NOTE — Progress Notes (Signed)
   Established Patient Office Visit  Subjective   Patient ID: Douglas Reeves, male    DOB: 01-Feb-1961  Age: 61 y.o. MRN: [id number]  Chief Complaint  Patient presents with   Follow-up    3 mo f/u HTN    HPI  Douglas Reeves is here today to follow-up on elevated blood pressure. He has not been checking his blood pressure at home. He denies chest pain, shortness of breath, and headaches.   Past Medical History:  Diagnosis Date   Diabetes mellitus    Hyperlipidemia    SBO (small bowel obstruction) ([place]) 03/30/2018   Past Surgical History:  Procedure Laterality Date   APPENDECTOMY  1972   BACK SURGERY  1973   VASECTOMY  2000   ROS See pertinent positives and negatives per HPI.    Objective:     BP (!) 142/70 (BP Location: Right Arm, Cuff Size: Large)   Pulse 94   Temp (!) 96.9 F (36.1 C) (Temporal)   Wt 294 lb (133.4 kg)   SpO2 95%   BMI 37.75 kg/m  BP Readings from Last 3 Encounters:  09/25/21 (!) 142/70  08/07/21 (!) 142/76  06/25/21 (!) 150/70     Physical Exam Vitals and nursing note reviewed.  Constitutional:      Appearance: Normal appearance.  HENT:     Head: Normocephalic.  Eyes:     Conjunctiva/sclera: Conjunctivae normal.  Cardiovascular:     Rate and Rhythm: Normal rate and regular rhythm.     Pulses: Normal pulses.     Heart sounds: Normal heart sounds.  Pulmonary:     Effort: Pulmonary effort is normal.     Breath sounds: Normal breath sounds.  Musculoskeletal:     Cervical back: Normal range of motion.  Skin:    General: Skin is warm.  Neurological:     General: No focal deficit present.     Mental Status: He is alert and oriented to person, place, and time.  Psychiatric:        Mood and Affect: Mood normal.        Behavior: Behavior normal.        Thought Content: Thought content normal.        Judgment: Judgment normal.      Assessment & Plan:   Problem List Items Addressed This Visit       Cardiovascular and Mediastinum    Hypertension associated with diabetes ([place]) - Primary    Blood pressure is still elevated today at 142/70. Will increase his losartan to '50mg'$  daily. Encouraged him to check his blood pressure at home. Follow-up in 4 weeks.       Relevant Medications   metFORMIN (GLUCOPHAGE-XR) 500 MG 24 hr tablet   losartan (COZAAR) 50 MG tablet    Return in about 4 weeks (around 10/23/2021) for HTN.    Charyl Dancer, NP

## 2021-09-25 ENCOUNTER — Ambulatory Visit: Payer: 59 | Admitting: Nurse Practitioner

## 2021-09-25 ENCOUNTER — Encounter: Payer: Self-pay | Admitting: Nurse Practitioner

## 2021-09-25 VITALS — BP 142/70 | HR 94 | Temp 96.9°F | Wt 294.0 lb

## 2021-09-25 DIAGNOSIS — I10 Essential (primary) hypertension: Secondary | ICD-10-CM | POA: Diagnosis not present

## 2021-09-25 DIAGNOSIS — I152 Hypertension secondary to endocrine disorders: Secondary | ICD-10-CM

## 2021-09-25 DIAGNOSIS — E1159 Type 2 diabetes mellitus with other circulatory complications: Secondary | ICD-10-CM | POA: Diagnosis not present

## 2021-09-25 MED ORDER — LOSARTAN POTASSIUM 50 MG PO TABS: 50.0000 mg | ORAL_TABLET | Freq: Every day | ORAL | 1 refills | Status: DC

## 2021-09-25 NOTE — Assessment & Plan Note (Signed)
Blood pressure is still elevated today at 142/70. Will increase his losartan to '50mg'$  daily. Encouraged him to check his blood pressure at home. Follow-up in 4 weeks.

## 2021-09-25 NOTE — Patient Instructions (Signed)
It was great to see you!  Increase your losartan to 1 tablet daily instead of 1/2 tablet.  Let's follow-up in 4 weeks, sooner if you have concerns.  If a referral was placed today, you will be contacted for an appointment. Please note that routine referrals can sometimes take up to 3-4 weeks to process. Please call our office if you haven't heard anything after this time frame.  Take care,  Vance Peper, NP

## 2021-10-23 ENCOUNTER — Encounter: Payer: Self-pay | Admitting: Nurse Practitioner

## 2021-10-23 ENCOUNTER — Ambulatory Visit: Payer: 59 | Admitting: Nurse Practitioner

## 2021-10-23 VITALS — BP 129/70 | HR 76 | Temp 96.6°F | Wt 294.0 lb

## 2021-10-23 DIAGNOSIS — E1159 Type 2 diabetes mellitus with other circulatory complications: Secondary | ICD-10-CM | POA: Diagnosis not present

## 2021-10-23 DIAGNOSIS — I152 Hypertension secondary to endocrine disorders: Secondary | ICD-10-CM

## 2021-10-23 NOTE — Patient Instructions (Signed)
It was great to see you!  Keep up the great work!   Let's follow-up in 6 months, sooner if you have concerns.  If a referral was placed today, you will be contacted for an appointment. Please note that routine referrals can sometimes take up to 3-4 weeks to process. Please call our office if you haven't heard anything after this time frame.  Take care,  Bolton Canupp, NP  

## 2021-10-23 NOTE — Progress Notes (Signed)
   Established Patient Office Visit  Subjective   Patient ID: Douglas Reeves, male    DOB: May 09, 1960  Age: 61 y.o. MRN: [id number]  Chief Complaint  Patient presents with   Hypertension    4 wk f/u HTN    HPI  Douglas Reeves is here to follow-up on hypertension. Last visit his losartan was increased to '50mg'$  daily.  He is not checking his blood pressure at home.  He is not having any side effects to the medications.  He denies chest pain, shortness of breath, headaches.    ROS See pertinent positives and negatives per HPI.    Objective:     BP 129/70 (BP Location: Left Arm, Patient Position: Sitting, Cuff Size: Large)   Pulse 76   Temp (!) 96.6 F (35.9 C) (Temporal)   Wt 294 lb (133.4 kg)   SpO2 97%   BMI 37.75 kg/m    Physical Exam Vitals and nursing note reviewed.  Constitutional:      Appearance: Normal appearance.  HENT:     Head: Normocephalic.  Eyes:     Conjunctiva/sclera: Conjunctivae normal.  Cardiovascular:     Rate and Rhythm: Normal rate and regular rhythm.     Pulses: Normal pulses.     Heart sounds: Normal heart sounds.  Pulmonary:     Effort: Pulmonary effort is normal.     Breath sounds: Normal breath sounds.  Musculoskeletal:     Cervical back: Normal range of motion.  Skin:    General: Skin is warm.  Neurological:     General: No focal deficit present.     Mental Status: He is alert and oriented to person, place, and time.  Psychiatric:        Mood and Affect: Mood normal.        Behavior: Behavior normal.        Thought Content: Thought content normal.        Judgment: Judgment normal.      Assessment & Plan:   Problem List Items Addressed This Visit       Cardiovascular and Mediastinum   Hypertension associated with diabetes ([place]) - Primary    Blood pressure is well controlled today at 129/70.  Continue losartan 50 mg daily.  We will check BMP today.  Follow-up in 6 months.      Relevant Orders   Basic metabolic panel     Return in about 6 months (around 04/25/2022) for CPE.    Charyl Dancer, NP

## 2021-10-23 NOTE — Assessment & Plan Note (Signed)
Blood pressure is well controlled today at 129/70.  Continue losartan 50 mg daily.  We will check BMP today.  Follow-up in 6 months.

## 2021-10-24 LAB — BASIC METABOLIC PANEL
BUN: 16 mg/dL (ref 6–23)
CO2: 29 mEq/L (ref 19–32)
Calcium: 9.6 mg/dL (ref 8.4–10.5)
Chloride: 103 mEq/L (ref 96–112)
Creatinine, Ser: 1.12 mg/dL (ref 0.40–1.50)
GFR: 71.43 mL/min (ref >60.00)
Glucose, Bld: 130 mg/dL — ABNORMAL HIGH (ref 70–99)
Potassium: 4.5 mEq/L (ref 3.5–5.1)
Sodium: 139 mEq/L (ref 135–145)

## 2021-11-10 ENCOUNTER — Other Ambulatory Visit (INDEPENDENT_AMBULATORY_CARE_PROVIDER_SITE_OTHER): Payer: 59

## 2021-11-10 ENCOUNTER — Other Ambulatory Visit: Payer: Self-pay | Admitting: Endocrinology

## 2021-11-10 DIAGNOSIS — E119 Type 2 diabetes mellitus without complications: Secondary | ICD-10-CM | POA: Diagnosis not present

## 2021-11-11 LAB — GLUCOSE, RANDOM: Glucose, Bld: 139 mg/dL — ABNORMAL HIGH (ref 70–99)

## 2021-11-11 LAB — HEMOGLOBIN A1C: Hgb A1c MFr Bld: 8.3 % — ABNORMAL HIGH (ref 4.6–6.5)

## 2021-11-13 LAB — HM DIABETES EYE EXAM

## 2021-11-17 ENCOUNTER — Ambulatory Visit: Payer: 59 | Admitting: Endocrinology

## 2021-11-17 ENCOUNTER — Encounter: Payer: Self-pay | Admitting: Endocrinology

## 2021-11-17 VITALS — BP 122/64 | HR 90 | Ht 74.0 in | Wt 294.2 lb

## 2021-11-17 DIAGNOSIS — I10 Essential (primary) hypertension: Secondary | ICD-10-CM

## 2021-11-17 DIAGNOSIS — E1165 Type 2 diabetes mellitus with hyperglycemia: Secondary | ICD-10-CM

## 2021-11-17 MED ORDER — TIRZEPATIDE 5 MG/0.5ML ~~LOC~~ SOAJ: 5.0000 mg | SUBCUTANEOUS | 0 refills | Status: DC

## 2021-11-17 MED ORDER — TIRZEPATIDE 10 MG/0.5ML ~~LOC~~ SOAJ: 10.0000 mg | SUBCUTANEOUS | 2 refills | Status: DC

## 2021-11-17 NOTE — Patient Instructions (Addendum)
Walk daily, target 30 min 5 days per week  Stop Acarbose , Bromocriptine, Pioglitazone  Take 2 shots of 0.'5mg'$  weeky of Ozempic  Check blood sugars on waking up 2-3  days a week  Also check blood sugars about 2 hours after meals and do this after different meals by rotation  Recommended blood sugar levels on waking up are 90-130 and about 2 hours after meal is 130-160  Please bring your blood sugar monitor to each visit, thank you  Reduce sweets  If sugar <80 then cut Glimeperide in 1/2  Call re strips  Mounjaro '5mg'$  for 4 shots then '10mg'$  weekly

## 2021-11-17 NOTE — Progress Notes (Unsigned)
Patient ID: Douglas Reeves, male   DOB: 1960-12-25, 61 y.o.   MRN: [id number]           Reason for Appointment: Type II Diabetes follow-up   History of Present Illness   Diagnosis date: 2003  Previous history:  He has been on varied and multiple non-insulin diabetes medications including Trulicity Reportedly Trulicity was stopped because of difficulty with availability Insulin was not used previously   A1c range in the last few years is: 6.8-9 0.0  Recent history:     Non-insulin hypoglycemic drugs: Acarbose, pioglitazone, Jardiance, Amaryl, Ozempic and bromocriptine 0.8               Side effects from medications: None  Current self management, blood sugar patterns and problems identified:  A1c is only slightly better at 8.3 compared to 8.9 He has not checked his blood sugars much and does not know what brand of meter he uses Although he thinks his blood sugars are about 180 when he comes back from work in the afternoon his blood sugar in the lab was 139 late afternoon He has not been motivated to exercise, previously thinks with regular walking and exercise he had lost weight His weight is staying about the same this year Overnight he had nausea and vomiting with 2 mg Ozempic he says he has recently tried taking 1 mg at a time using 2 shots of 0.5 mg without difficulty Usually not checking fasting blood sugars He has been taking acarbose 3 times a day and he thinks he is fairly regular with taking this at mealtimes without GI side effects  Exercise: none  Diet management: He tends to have more snacks in the evening and sweets, usually eating sandwiches both at breakfast and lunch     Hypoglycemia:  none    Glucometer: Unknown         Blood Glucose readings from recall  PRE-MEAL Fasting Lunch Dinner Bedtime Overall  Glucose range: ?  180-200    Mean/median:        POST-MEAL PC Breakfast PC Lunch PC Dinner  Glucose range:     Mean/median:      Bfst 6 am fruit,  sandwich , lunch sandwich at 11.30, dinner 5 PM; snacks in the evening including cakes Dietician visit: Most recent:    Unknown  Weight control:  Wt Readings from Last 3 Encounters:  11/17/21 294 lb 3.2 oz (133.4 kg)  10/23/21 294 lb (133.4 kg)  09/25/21 294 lb (133.4 kg)            Diabetes labs:  Lab Results  Component Value Date   HGBA1C 8.3 (H) 11/10/2021   HGBA1C 8.9 (A) 08/07/2021   HGBA1C 8.3 (A) 05/20/2021   Lab Results  Component Value Date   MICROALBUR <0.7 05/28/2021   LDLCALC 60 06/25/2021   CREATININE 1.12 10/23/2021    No results found for: "FRUCTOSAMINE"   Allergies as of 11/17/2021   No Known Allergies      Medication List        Accurate as of November 17, 2021  4:58 PM. If you have any questions, ask your nurse or doctor.          STOP taking these medications    acarbose 50 MG tablet Commonly known as: PRECOSE Stopped by: Elayne Snare, MD   Bromocriptine Mesylate 0.8 MG Tabs Stopped by: Elayne Snare, MD   Ozempic (0.25 or 0.5 MG/DOSE) 2 MG/1.5ML Sopn Generic drug: Semaglutide(0.25 or 0.'5MG'$ /DOS) Stopped  by: Elayne Snare, MD   pioglitazone 45 MG tablet Commonly known as: ACTOS Stopped by: Elayne Snare, MD       TAKE these medications    atorvastatin 20 MG tablet Commonly known as: LIPITOR TAKE 1 TABLET BY MOUTH AT BEDTIME   colesevelam 625 MG tablet Commonly known as: WELCHOL Take 1 tablet (625 mg total) by mouth daily.   empagliflozin 25 MG Tabs tablet Commonly known as: Jardiance Take 1 tablet (25 mg total) by mouth daily.   fenofibrate 160 MG tablet TAKE 1 TABLET BY MOUTH ONCE DAILY. NEEDS APPOINTMENT BEFORE ANYMORE REFILLS.   glimepiride 4 MG tablet Commonly known as: AMARYL Take 1 tablet (4 mg total) by mouth daily with breakfast.   losartan 50 MG tablet Commonly known as: COZAAR Take 1 tablet (50 mg total) by mouth daily.   meclizine 25 MG tablet Commonly known as: ANTIVERT Take 1 tablet (25 mg total) by mouth 3  (three) times daily as needed for dizziness. What changed: additional instructions   metFORMIN 500 MG 24 hr tablet Commonly known as: GLUCOPHAGE-XR Take 2,000 mg by mouth daily. Refill needed   tirzepatide 5 MG/0.5ML Pen Commonly known as: MOUNJARO Inject 5 mg into the skin once a week. Started by: Elayne Snare, MD   tirzepatide 10 MG/0.5ML Pen Commonly known as: MOUNJARO Inject 10 mg into the skin once a week. Started by: Elayne Snare, MD        Allergies: No Known Allergies  Past Medical History:  Diagnosis Date   Diabetes mellitus    Hyperlipidemia    SBO (small bowel obstruction) ([place]) 03/30/2018    Past Surgical History:  Procedure Laterality Date   APPENDECTOMY  1972   BACK SURGERY  1973   VASECTOMY  2000    Family History  Problem Relation Age of Onset   Diabetes Brother    Colon cancer Neg Hx     Social History:  reports that he has never smoked. He has never used smokeless tobacco. He reports that he does not drink alcohol and does not use drugs.  Review of Systems:  Last diabetic eye exam date 4/21  Last foot exam date: 8/22  Symptoms of neuropathy: None  Hypertension:   Treatment includes losartan 50 mg daily  BP Readings from Last 3 Encounters:  11/17/21 122/64  10/23/21 129/70  09/25/21 (!) 142/70    Lipid management: By PCP with atorvastatin 20 mg, WelChol and fenofibrate    Lab Results  Component Value Date   CHOL 136 06/25/2021   CHOL 116 11/21/2019   CHOL 121 06/13/2019   Lab Results  Component Value Date   HDL 58.40 06/25/2021   HDL 50.50 11/21/2019   HDL 43.30 06/13/2019   Lab Results  Component Value Date   LDLCALC 60 06/25/2021   LDLCALC 52 11/21/2019   LDLCALC 86 04/22/2017   Lab Results  Component Value Date   TRIG 87.0 06/25/2021   TRIG 68.0 11/21/2019   TRIG 328.0 (H) 06/13/2019   Lab Results  Component Value Date   CHOLHDL 2 06/25/2021   CHOLHDL 2 11/21/2019   CHOLHDL 3 06/13/2019   Lab Results   Component Value Date   LDLDIRECT 46.0 06/13/2019   LDLDIRECT 99.0 11/29/2018     Examination:   BP 122/64   Pulse 90   Ht '6\' 2"'$  (1.88 m)   Wt 294 lb 3.2 oz (133.4 kg)   SpO2 95%   BMI 37.77 kg/m   Body mass index is  37.77 kg/m.    ASSESSMENT/ PLAN:    Diabetes type 2 with history of hypertension and hyperlipidemia:   Current regimen: Multiple drugs as above  See history of present illness for detailed discussion of current diabetes management, blood sugar patterns and problems identified  A1c is 8.3 and consistently high  Blood glucose control is suboptimal Currently he is not managing his diabetes well with no exercise, likely minimal glucose monitoring, getting too many carbohydrates and sweets with snacks in the evening and likely not controlling overall carbohydrates or portions  Also he is already on 6 drugs for his diabetes management and likely some of them are ineffective or minimally effective Discussed possibility of needing insulin if blood sugars are not well-controlled especially with maximal doses of other agents and is reluctant to consider insulin also  Recommendations:  Start walking daily, target 30 min 5 days per week  Stop Acarbose , Bromocriptine, Pioglitazone  Take 2 shots of 0.'5mg'$  of the last supply of Ozempic and then switch to [employer] '5mg'$  for 4 shots with the prescription that was sent then '10mg'$  weekly using the written prescription given Discussed with the patient the action of GIP/GLP-1 drugs, the effects on pancreatic and liver function, effects on brain and stomach with improved satiety, slowing gastric emptying, improving satiety and reducing liver glucose output.  Discussed the effects on promoting weight loss. Explained possible side effects of MOUNJARO, most commonly nausea that usually improves over time; discussed safety information in package insert.  Demonstrated the medication injection device and injection technique to the  patient.  Patient brochure on Mounjaro given  He will need to call to let us know which meter he is using and will prescribe test trips Discussed that he needs to check blood sugars on waking up 2-3  days a week Also check blood sugars about 2 hours after meals and do this after different meals by rotation  Recommended blood sugar levels on waking up are 90-130 and about 2 hours after meal is 130-160  Reduce portions, carbohydrates and sweets  If sugar <80 then cut Glimeperide in 1/2 Follow-up in 2 months Consultation with diabetes educator for general counseling especially meal planning and encouraging better compliance and weight loss  Also needs follow-up eye exam, last done 2 years ago  Total visit time for evaluation and management and counseling = 30 minutes  Patient Instructions  Walk daily, target 30 min 5 days per week  Stop Acarbose , Bromocriptine, Pioglitazone  Take 2 shots of 0.'5mg'$  weeky of Ozempic  Check blood sugars on waking up 2-3  days a week  Also check blood sugars about 2 hours after meals and do this after different meals by rotation  Recommended blood sugar levels on waking up are 90-130 and about 2 hours after meal is 130-160  Please bring your blood sugar monitor to each visit, thank you  Reduce sweets  If sugar <80 then cut Glimeperide in 1/2  Call re strips  Mounjaro '5mg'$  for 4 shots then '10mg'$  weekly   Elayne Snare 11/17/2021, 4:58 PM

## 2021-11-26 ENCOUNTER — Other Ambulatory Visit: Payer: Self-pay

## 2021-11-26 DIAGNOSIS — E781 Pure hyperglyceridemia: Secondary | ICD-10-CM

## 2021-11-26 MED ORDER — FENOFIBRATE 160 MG PO TABS: ORAL_TABLET | ORAL | 1 refills | Status: DC

## 2021-11-28 ENCOUNTER — Other Ambulatory Visit: Payer: Self-pay | Admitting: Nurse Practitioner

## 2021-12-05 ENCOUNTER — Other Ambulatory Visit: Payer: Self-pay

## 2021-12-05 MED ORDER — ATORVASTATIN CALCIUM 20 MG PO TABS: 20.0000 mg | ORAL_TABLET | Freq: Every day | ORAL | 0 refills | Status: DC

## 2021-12-08 ENCOUNTER — Other Ambulatory Visit: Payer: Self-pay | Admitting: Endocrinology

## 2022-01-08 ENCOUNTER — Other Ambulatory Visit: Payer: Self-pay | Admitting: Endocrinology

## 2022-01-26 ENCOUNTER — Other Ambulatory Visit (INDEPENDENT_AMBULATORY_CARE_PROVIDER_SITE_OTHER): Payer: 59

## 2022-01-26 DIAGNOSIS — E1165 Type 2 diabetes mellitus with hyperglycemia: Secondary | ICD-10-CM

## 2022-01-27 LAB — BASIC METABOLIC PANEL
BUN: 16 mg/dL (ref 6–23)
CO2: 27 mEq/L (ref 19–32)
Calcium: 9.2 mg/dL (ref 8.4–10.5)
Chloride: 103 mEq/L (ref 96–112)
Creatinine, Ser: 1.11 mg/dL (ref 0.40–1.50)
GFR: 72.07 mL/min (ref >60.00)
Glucose, Bld: 150 mg/dL — ABNORMAL HIGH (ref 70–99)
Potassium: 4.1 mEq/L (ref 3.5–5.1)
Sodium: 137 mEq/L (ref 135–145)

## 2022-01-27 LAB — HEMOGLOBIN A1C: Hgb A1c MFr Bld: 7.9 % — ABNORMAL HIGH (ref 4.6–6.5)

## 2022-01-29 ENCOUNTER — Encounter: Payer: Self-pay | Admitting: Endocrinology

## 2022-01-29 ENCOUNTER — Ambulatory Visit: Payer: 59 | Admitting: Endocrinology

## 2022-01-29 VITALS — BP 146/80 | HR 82 | Ht 72.0 in | Wt 277.8 lb

## 2022-01-29 DIAGNOSIS — E1165 Type 2 diabetes mellitus with hyperglycemia: Secondary | ICD-10-CM

## 2022-01-29 DIAGNOSIS — Z23 Encounter for immunization: Secondary | ICD-10-CM | POA: Diagnosis not present

## 2022-01-29 NOTE — Patient Instructions (Signed)
Check blood sugars on waking up 2-3 days a week  Also check blood sugars about 2 hours after meals and do this after different meals by rotation  Recommended blood sugar levels on waking up are 90-130 and about 2 hours after meal is 130-160  Please bring your blood sugar monitor to each visit, thank you  Stop Welchol  Call if sugar <90

## 2022-01-29 NOTE — Progress Notes (Unsigned)
Patient ID: Douglas Reeves, male   DOB: Mar 12, 1961, 61 y.o.   MRN: [id number]           Reason for Appointment: Type II Diabetes follow-up   History of Present Illness   Diagnosis date: 2003  Previous history:  He has been on varied and multiple non-insulin diabetes medications including Trulicity Reportedly Trulicity was stopped because of difficulty with availability Insulin was not used previously   A1c range in the last few years is: 6.8-9.0  Recent history:     Non-insulin hypoglycemic drugs: Jardiance 25 mg daily, Amaryl 4 mg daily, Mounjaro 10 mg weekly, metformin ER 2 g daily     Side effects from medications: None  Current self management, blood sugar patterns and problems identified:  A1c is only slightly better at 7.9, previously 8.3 However he has lost 17 pounds with switching from Ozempic to [place] and also stopping pioglitazone Also previously was having difficulty tolerating maximum dose of Ozempic but now is doing well with 10 mg Mounjaro The dose was increased at the end of August Also he feels overall better with better blood sugar control, weight loss and reducing his medications Still not checking his blood sugars much and does not remember the last few readings He has however started walking more regularly also Hypoglycemia:  none even with taking Amaryl 4 mg Lab glucose 150 after lunch  Exercise: walks 2-3/ 2-4 miles  Diet management: He tends to have more snacks in the evening and sweets, usually eating sandwiches both at breakfast and lunch      Glucometer: Unknown         Blood Glucose readings not available  Bfst 6 am fruit, sandwich , lunch sandwich at 11.30, dinner 5 PM; snacks in the evening including cakes Dietician visit: Most recent:    Unknown  Weight control:  Wt Readings from Last 3 Encounters:  01/29/22 277 lb 12.8 oz (126 kg)  11/17/21 294 lb 3.2 oz (133.4 kg)  10/23/21 294 lb (133.4 kg)            Diabetes labs:  Lab  Results  Component Value Date   HGBA1C 7.9 (H) 01/26/2022   HGBA1C 8.3 (H) 11/10/2021   HGBA1C 8.9 (A) 08/07/2021   Lab Results  Component Value Date   MICROALBUR <0.7 05/28/2021   LDLCALC 60 06/25/2021   CREATININE 1.11 01/26/2022    No results found for: "FRUCTOSAMINE"   Allergies as of 01/29/2022   No Known Allergies      Medication List        Accurate as of January 29, 2022 11:59 PM. If you have any questions, ask your nurse or doctor.          atorvastatin 20 MG tablet Commonly known as: LIPITOR Take 1 tablet (20 mg total) by mouth at bedtime.   colesevelam 625 MG tablet Commonly known as: WELCHOL Take 1 tablet (625 mg total) by mouth daily.   empagliflozin 25 MG Tabs tablet Commonly known as: Jardiance Take 1 tablet (25 mg total) by mouth daily.   fenofibrate 160 MG tablet TAKE 1 TABLET BY MOUTH ONCE DAILY. NEEDS APPOINTMENT BEFORE ANYMORE REFILLS.   glimepiride 4 MG tablet Commonly known as: AMARYL Take 1 tablet (4 mg total) by mouth daily with breakfast.   losartan 50 MG tablet Commonly known as: COZAAR Take 1 tablet (50 mg total) by mouth daily.   meclizine 25 MG tablet Commonly known as: ANTIVERT Take 1 tablet (25 mg total) by  mouth 3 (three) times daily as needed for dizziness. What changed: additional instructions   metFORMIN 500 MG 24 hr tablet Commonly known as: GLUCOPHAGE-XR Take 2,000 mg by mouth daily. Refill needed   tirzepatide 10 MG/0.5ML Pen Commonly known as: MOUNJARO Inject 10 mg into the skin once a week.   Mounjaro 10 MG/0.5ML Pen Generic drug: tirzepatide INJECT 10 MG SUBCUTANEOUSLY  ONCE A WEEK        Allergies: No Known Allergies  Past Medical History:  Diagnosis Date   Diabetes mellitus    Hyperlipidemia    SBO (small bowel obstruction) ([facility]) 03/30/2018    Past Surgical History:  Procedure Laterality Date   APPENDECTOMY  1972   BACK SURGERY  1973   VASECTOMY  2000    Family History  Problem  Relation Age of Onset   Diabetes Brother    Colon cancer Neg Hx     Social History:  reports that he has never smoked. He has never used smokeless tobacco. He reports that he does not drink alcohol and does not use drugs.  Review of Systems:  Last diabetic eye exam date 4/21  Last foot exam date: 8/22  Symptoms of neuropathy: None  Hypertension:   Treatment includes losartan 50 mg daily  BP Readings from Last 3 Encounters:  01/29/22 (!) 146/80  11/17/21 122/64  10/23/21 129/70    Lipid management: By PCP with atorvastatin 20 mg, WelChol 1 tablet daily and fenofibrate    Lab Results  Component Value Date   CHOL 136 06/25/2021   CHOL 116 11/21/2019   CHOL 121 06/13/2019   Lab Results  Component Value Date   HDL 58.40 06/25/2021   HDL 50.50 11/21/2019   HDL 43.30 06/13/2019   Lab Results  Component Value Date   LDLCALC 60 06/25/2021   LDLCALC 52 11/21/2019   LDLCALC 86 04/22/2017   Lab Results  Component Value Date   TRIG 87.0 06/25/2021   TRIG 68.0 11/21/2019   TRIG 328.0 (H) 06/13/2019   Lab Results  Component Value Date   CHOLHDL 2 06/25/2021   CHOLHDL 2 11/21/2019   CHOLHDL 3 06/13/2019   Lab Results  Component Value Date   LDLDIRECT 46.0 06/13/2019   LDLDIRECT 99.0 11/29/2018     Examination:   BP (!) 146/80   Pulse 82   Ht 6' (1.829 m)   Wt 277 lb 12.8 oz (126 kg)   SpO2 96%   BMI 37.68 kg/m   Body mass index is 37.68 kg/m.    ASSESSMENT/ PLAN:    Diabetes type 2 with history of hypertension and hyperlipidemia:   Current regimen: Jardiance 25 mg daily, Amaryl 4 mg daily, Mounjaro 10 mg weekly, metformin ER 2 g daily  See history of present illness for detailed discussion of current diabetes management, blood sugar patterns and problems identified  A1c is 7.9, was 8.3   Blood glucose control is improving and he has lost significant amount of weight with Mounjaro He has done some walking and is more motivated to watch his diet also  with progressive weight loss No side effects with 10 mg Mounjaro which he has taken for 6 or 7 weeks Does not monitor blood sugars much and difficult to know if they are consistently controlled  Recommendations:  Continue Mounjaro Check blood sugars more regularly alternating fasting and after meals If blood sugars start getting below 90 may reduce or stop glimepiride Increase walking May stop WelChol, not needed currently especially with subtherapeutic  dose  Flu vaccine given  Patient Instructions  Check blood sugars on waking up 2-3 days a week  Also check blood sugars about 2 hours after meals and do this after different meals by rotation  Recommended blood sugar levels on waking up are 90-130 and about 2 hours after meal is 130-160  Please bring your blood sugar monitor to each visit, thank you  Stop Welchol  Call if sugar <90   Elayne Snare 01/30/2022, 9:20 PM

## 2022-02-09 ENCOUNTER — Ambulatory Visit: Payer: 59 | Admitting: Dietician

## 2022-02-09 ENCOUNTER — Other Ambulatory Visit: Payer: Self-pay

## 2022-02-09 MED ORDER — EMPAGLIFLOZIN 25 MG PO TABS: 25.0000 mg | ORAL_TABLET | Freq: Every day | ORAL | 1 refills | Status: DC

## 2022-02-26 ENCOUNTER — Other Ambulatory Visit: Payer: Self-pay

## 2022-02-26 DIAGNOSIS — E1165 Type 2 diabetes mellitus with hyperglycemia: Secondary | ICD-10-CM

## 2022-02-26 MED ORDER — METFORMIN HCL ER 500 MG PO TB24: 2000.0000 mg | ORAL_TABLET | Freq: Every day | ORAL | 1 refills | Status: DC

## 2022-03-28 ENCOUNTER — Other Ambulatory Visit: Payer: Self-pay | Admitting: Nurse Practitioner

## 2022-04-01 ENCOUNTER — Other Ambulatory Visit: Payer: Self-pay

## 2022-04-01 ENCOUNTER — Other Ambulatory Visit: Payer: Self-pay | Admitting: Nurse Practitioner

## 2022-04-01 DIAGNOSIS — E1165 Type 2 diabetes mellitus with hyperglycemia: Secondary | ICD-10-CM

## 2022-04-01 DIAGNOSIS — Z23 Encounter for immunization: Secondary | ICD-10-CM

## 2022-04-01 MED ORDER — GLIMEPIRIDE 4 MG PO TABS: 4.0000 mg | ORAL_TABLET | Freq: Every day | ORAL | 1 refills | Status: DC

## 2022-04-24 ENCOUNTER — Encounter: Payer: 59 | Admitting: Nurse Practitioner

## 2022-05-07 ENCOUNTER — Encounter: Payer: Self-pay | Admitting: Nurse Practitioner

## 2022-05-07 ENCOUNTER — Ambulatory Visit (INDEPENDENT_AMBULATORY_CARE_PROVIDER_SITE_OTHER): Payer: 59 | Admitting: Nurse Practitioner

## 2022-05-07 VITALS — BP 132/74 | HR 90 | Temp 97.1°F | Ht 74.0 in | Wt 262.8 lb

## 2022-05-07 DIAGNOSIS — E669 Obesity, unspecified: Secondary | ICD-10-CM | POA: Diagnosis not present

## 2022-05-07 DIAGNOSIS — E785 Hyperlipidemia, unspecified: Secondary | ICD-10-CM

## 2022-05-07 DIAGNOSIS — E1169 Type 2 diabetes mellitus with other specified complication: Secondary | ICD-10-CM

## 2022-05-07 DIAGNOSIS — Z125 Encounter for screening for malignant neoplasm of prostate: Secondary | ICD-10-CM

## 2022-05-07 DIAGNOSIS — I152 Hypertension secondary to endocrine disorders: Secondary | ICD-10-CM

## 2022-05-07 DIAGNOSIS — Z0000 Encounter for general adult medical examination without abnormal findings: Secondary | ICD-10-CM | POA: Diagnosis not present

## 2022-05-07 DIAGNOSIS — E1159 Type 2 diabetes mellitus with other circulatory complications: Secondary | ICD-10-CM

## 2022-05-07 LAB — LIPID PANEL
Cholesterol: 113 mg/dL (ref 0–200)
HDL: 45.3 mg/dL (ref >39.00)
LDL Cholesterol: 53 mg/dL (ref 0–99)
NonHDL: 67.96
Total CHOL/HDL Ratio: 3
Triglycerides: 74 mg/dL (ref 0.0–149.0)
VLDL: 14.8 mg/dL (ref 0.0–40.0)

## 2022-05-07 LAB — COMPREHENSIVE METABOLIC PANEL
ALT: 17 U/L (ref 0–53)
AST: 18 U/L (ref 0–37)
Albumin: 4.6 g/dL (ref 3.5–5.2)
Alkaline Phosphatase: 39 U/L (ref 39–117)
BUN: 27 mg/dL — ABNORMAL HIGH (ref 6–23)
CO2: 27 mEq/L (ref 19–32)
Calcium: 9.5 mg/dL (ref 8.4–10.5)
Chloride: 100 mEq/L (ref 96–112)
Creatinine, Ser: 1.04 mg/dL (ref 0.40–1.50)
GFR: 77.78 mL/min (ref >60.00)
Glucose, Bld: 84 mg/dL (ref 70–99)
Potassium: 4.4 mEq/L (ref 3.5–5.1)
Sodium: 138 mEq/L (ref 135–145)
Total Bilirubin: 0.6 mg/dL (ref 0.2–1.2)
Total Protein: 7.5 g/dL (ref 6.0–8.3)

## 2022-05-07 LAB — CBC
HCT: 44.2 % (ref 39.0–52.0)
Hemoglobin: 14.8 g/dL (ref 13.0–17.0)
MCHC: 33.5 g/dL (ref 30.0–36.0)
MCV: 87.6 fl (ref 78.0–100.0)
Platelets: 326 10*3/uL (ref 150.0–400.0)
RBC: 5.05 Mil/uL (ref 4.22–5.81)
RDW: 14.3 % (ref 11.5–15.5)
WBC: 5.7 10*3/uL (ref 4.0–10.5)

## 2022-05-07 LAB — MICROALBUMIN / CREATININE URINE RATIO
Creatinine,U: 222.5 mg/dL
Microalb Creat Ratio: 0.4 mg/g (ref 0.0–30.0)
Microalb, Ur: 0.8 mg/dL (ref 0.0–1.9)

## 2022-05-07 LAB — HEMOGLOBIN A1C: Hgb A1c MFr Bld: 7.9 % — ABNORMAL HIGH (ref 4.6–6.5)

## 2022-05-07 LAB — PSA: PSA: 2.48 ng/mL (ref 0.10–4.00)

## 2022-05-07 NOTE — Assessment & Plan Note (Signed)
Chronic, stable.  Continue atorvastatin 20 mg daily and fenofibrate 160 mg daily.  Check lipid panel today.

## 2022-05-07 NOTE — Patient Instructions (Addendum)
It was great to see you!  We are checking your labs today and will let you know the results via mychart/phone.   Keep up the great work!  Let's follow-up in 6 months, sooner if you have concerns.  If a referral was placed today, you will be contacted for an appointment. Please note that routine referrals can sometimes take up to 3-4 weeks to process. Please call our office if you haven't heard anything after this time frame.  Take care,  Vance Peper, NP

## 2022-05-07 NOTE — Assessment & Plan Note (Signed)
Chronic, stable.  BP today 132/74.  Continue losartan 50 mg daily.  Check CMP, CBC, lipid panel today.  Follow-up in 6 months.

## 2022-05-07 NOTE — Progress Notes (Signed)
BP 132/74   Pulse 90   Temp (!) 97.1 F (36.2 C)   Ht '6\' 2"'$  (1.88 m)   Wt 262 lb 12.8 oz (119.2 kg)   SpO2 96%   BMI 33.74 kg/m    Subjective:    Patient ID: Douglas Reeves, male    DOB: 10/28/1960, 62 y.o.   MRN: [id number]  CC: Chief Complaint  Patient presents with   Annual Exam    Pt is fasting for labs , is here for physical    HPI: Douglas Reeves is a 62 y.o. male presenting on 05/07/2022 for comprehensive medical examination. Current medical complaints include:none  Interim Problems from his last visit: no  Depression Screen done today and results listed below:     05/07/2022    1:24 PM 05/28/2021   11:02 AM 03/03/2019    9:38 AM 07/31/2016    3:45 PM 06/15/2014    2:46 PM  Depression screen PHQ 2/9  Decreased Interest 0 0 0 0 0  Down, Depressed, Hopeless 0 0 0 0 0  PHQ - 2 Score 0 0 0 0 0    The patient does not have a history of falls. I did not complete a risk assessment for falls. A plan of care for falls was not documented.   Past Medical History:  Past Medical History:  Diagnosis Date   Diabetes mellitus    Hyperlipidemia    SBO (small bowel obstruction) ([facility]) 03/30/2018    Surgical History:  Past Surgical History:  Procedure Laterality Date   APPENDECTOMY  1972   BACK SURGERY  1973   VASECTOMY  2000    Medications:  Current Outpatient Medications on File Prior to Visit  Medication Sig   atorvastatin (LIPITOR) 20 MG tablet Take 1 tablet (20 mg total) by mouth at bedtime.   empagliflozin (JARDIANCE) 25 MG TABS tablet Take 1 tablet (25 mg total) by mouth daily.   fenofibrate 160 MG tablet TAKE 1 TABLET BY MOUTH ONCE DAILY. NEEDS APPOINTMENT BEFORE ANYMORE REFILLS.   glimepiride (AMARYL) 4 MG tablet Take 1 tablet (4 mg total) by mouth daily with breakfast.   losartan (COZAAR) 50 MG tablet Take 1 tablet by mouth once daily   metFORMIN (GLUCOPHAGE-XR) 500 MG 24 hr tablet Take 4 tablets (2,000 mg total) by mouth daily. Refill needed    MOUNJARO 10 MG/0.5ML Pen INJECT 10 MG SUBCUTANEOUSLY  ONCE A WEEK   tirzepatide (MOUNJARO) 10 MG/0.5ML Pen Inject 10 mg into the skin once a week.   colesevelam (WELCHOL) 625 MG tablet Take 1 tablet (625 mg total) by mouth daily. (Patient not taking: Reported on 05/07/2022)   meclizine (ANTIVERT) 25 MG tablet Take 1 tablet (25 mg total) by mouth 3 (three) times daily as needed for dizziness. (Patient not taking: Reported on 05/07/2022)   No current facility-administered medications on file prior to visit.    Allergies:  No Known Allergies  Social History:  Social History   Socioeconomic History   Marital status: Married    Spouse name: Not on file   Number of children: Not on file   Years of education: Not on file   Highest education level: Not on file  Occupational History   Not on file  Tobacco Use   Smoking status: Never   Smokeless tobacco: Never  Vaping Use   Vaping Use: Never used  Substance and Sexual Activity   Alcohol use: No    Alcohol/week: 0.0 standard drinks of alcohol  Drug use: No   Sexual activity: Not on file  Other Topics Concern   Not on file  Social History Narrative   Not on file   Social Determinants of Health   Financial Resource Strain: Not on file  Food Insecurity: Not on file  Transportation Needs: Not on file  Physical Activity: Not on file  Stress: Not on file  Social Connections: Not on file  Intimate Partner Violence: Not on file   Social History   Tobacco Use  Smoking Status Never  Smokeless Tobacco Never   Social History   Substance and Sexual Activity  Alcohol Use No   Alcohol/week: 0.0 standard drinks of alcohol    Family History:  Family History  Problem Relation Age of Onset   Diabetes Brother    Colon cancer Neg Hx     Past medical history, surgical history, medications, allergies, family history and social history reviewed with patient today and changes made to appropriate areas of the chart.   Review of Systems   Constitutional: Negative.   HENT: Negative.    Eyes: Negative.   Respiratory: Negative.    Cardiovascular: Negative.   Gastrointestinal: Negative.   Genitourinary: Negative.   Musculoskeletal: Negative.   Skin: Negative.   Neurological: Negative.   Psychiatric/Behavioral: Negative.     All other ROS negative except what is listed above and in the HPI.      Objective:    BP 132/74   Pulse 90   Temp (!) 97.1 F (36.2 C)   Ht '6\' 2"'$  (1.88 m)   Wt 262 lb 12.8 oz (119.2 kg)   SpO2 96%   BMI 33.74 kg/m   Wt Readings from Last 3 Encounters:  05/07/22 262 lb 12.8 oz (119.2 kg)  01/29/22 277 lb 12.8 oz (126 kg)  11/17/21 294 lb 3.2 oz (133.4 kg)    Physical Exam Vitals and nursing note reviewed.  Constitutional:      General: He is not in acute distress.    Appearance: Normal appearance.  HENT:     Head: Normocephalic and atraumatic.     Right Ear: Tympanic membrane, ear canal and external ear normal.     Left Ear: Tympanic membrane, ear canal and external ear normal.  Eyes:     Conjunctiva/sclera: Conjunctivae normal.  Cardiovascular:     Rate and Rhythm: Normal rate and regular rhythm.     Pulses: Normal pulses.     Heart sounds: Normal heart sounds.  Pulmonary:     Effort: Pulmonary effort is normal.     Breath sounds: Normal breath sounds.  Abdominal:     Palpations: Abdomen is soft.     Tenderness: There is no abdominal tenderness.  Musculoskeletal:        General: Normal range of motion.     Cervical back: Normal range of motion and neck supple. No tenderness.     Right lower leg: No edema.     Left lower leg: No edema.  Lymphadenopathy:     Cervical: No cervical adenopathy.  Skin:    General: Skin is warm and dry.  Neurological:     General: No focal deficit present.     Mental Status: He is alert and oriented to person, place, and time.     Cranial Nerves: No cranial nerve deficit.     Coordination: Coordination normal.     Gait: Gait normal.   Psychiatric:        Mood and Affect: Mood normal.  Behavior: Behavior normal.        Thought Content: Thought content normal.        Judgment: Judgment normal.    Diabetic Foot Exam - Simple   Simple Foot Form Visual Inspection No deformities, no ulcerations, no other skin breakdown bilaterally: Yes Sensation Testing Intact to touch and monofilament testing bilaterally: Yes Pulse Check Posterior Tibialis and Dorsalis pulse intact bilaterally: Yes Comments     Results for orders placed or performed in visit on 18/84/16  Basic metabolic panel  Result Value Ref Range   Sodium 137 135 - 145 mEq/L   Potassium 4.1 3.5 - 5.1 mEq/L   Chloride 103 96 - 112 mEq/L   CO2 27 19 - 32 mEq/L   Glucose, Bld 150 (H) 70 - 99 mg/dL   BUN 16 6 - 23 mg/dL   Creatinine, Ser 1.11 0.40 - 1.50 mg/dL   GFR 72.07 >60.00 mL/min   Calcium 9.2 8.4 - 10.5 mg/dL  Hemoglobin A1c  Result Value Ref Range   Hgb A1c MFr Bld 7.9 (H) 4.6 - 6.5 %      Assessment & Plan:   Problem List Items Addressed This Visit       Cardiovascular and Mediastinum   Hypertension associated with diabetes (HCC)    Chronic, stable.  BP today 132/74.  Continue losartan 50 mg daily.  Check CMP, CBC, lipid panel today.  Follow-up in 6 months.      Relevant Orders   CBC   Comprehensive metabolic panel   Lipid panel     Endocrine   Hyperlipidemia associated with type 2 diabetes mellitus (HCC)    Chronic, stable.  Continue atorvastatin 20 mg daily and fenofibrate 160 mg daily.  Check lipid panel today.      Relevant Orders   CBC   Comprehensive metabolic panel   Lipid panel   Type 2 diabetes mellitus with obesity ([facility])    He has recently lost 32 pounds and has been taking Mounjaro 10 mg weekly, metformin XR 2000 mg daily, Jardiance 25 mg daily, and Amaryl 4 mg daily.  Will check A1c, CMP, CBC, urine microalbumin today.  Eye exam up-to-date.  Foot exam done today and normal.  Continue collaboration with  endocrinology.  Follow-up in 6 months.      Relevant Orders   CBC   Comprehensive metabolic panel   Lipid panel   Hemoglobin A1c   Microalbumin / creatinine urine ratio     Other   Obesity (BMI 30-39.9)    He has lost 32 pounds since his last visit. Congratulated him on this!  Encouraged ongoing weight loss with walking and decrease portion sizes.      Other Visit Diagnoses     Routine general medical examination at a health care facility    -  Primary   Health maintenance reviewed and updated.  Check CMP, CBC today.  Discussed nutrition, exercise.  Follow-up 1 year   Screening for prostate cancer       Check PSA today   Relevant Orders   PSA       LABORATORY TESTING:  Health maintenance labs ordered today as discussed above.   The natural history of prostate cancer and ongoing controversy regarding screening and potential treatment outcomes of prostate cancer has been discussed with the patient. The meaning of a false positive PSA and a false negative PSA has been discussed. He indicates understanding of the limitations of this screening test and wishes to proceed with  screening PSA testing.   IMMUNIZATIONS:   - Tdap: Tetanus vaccination status reviewed: last tetanus booster within 10 years. - Influenza: Up to date - Pneumovax: Up to date - Prevnar: Not applicable - HPV: Not applicable - Zostavax vaccine: Up to date  SCREENING: - Colonoscopy: Up to date  Discussed with patient purpose of the colonoscopy is to detect colon cancer at curable precancerous or early stages   - AAA Screening: Not applicable  -Hearing Test: Not applicable  -Spirometry: Not applicable   PATIENT COUNSELING:    Sexuality: Discussed sexually transmitted diseases, partner selection, use of condoms, avoidance of unintended pregnancy  and contraceptive alternatives.   Advised to avoid cigarette smoking.  I discussed with the patient that most people either abstain from alcohol or drink  within safe limits (<=14/week and <=4 drinks/occasion for males, <=7/weeks and <= 3 drinks/occasion for females) and that the risk for alcohol disorders and other health effects rises proportionally with the number of drinks per week and how often a drinker exceeds daily limits.  Discussed cessation/primary prevention of drug use and availability of treatment for abuse.   Diet: Encouraged to adjust caloric intake to maintain  or achieve ideal body weight, to reduce intake of dietary saturated fat and total fat, to limit sodium intake by avoiding high sodium foods and not adding table salt, and to maintain adequate dietary potassium and calcium preferably from fresh fruits, vegetables, and low-fat dairy products.    stressed the importance of regular exercise  Injury prevention: Discussed safety belts, safety helmets, smoke detector, smoking near bedding or upholstery.   Dental health: Discussed importance of regular tooth brushing, flossing, and dental visits.   Follow up plan: NEXT PREVENTATIVE PHYSICAL DUE IN 1 YEAR. Return in about 6 months (around 11/05/2022) for HTN, Diabetes.

## 2022-05-07 NOTE — Assessment & Plan Note (Addendum)
He has lost 32 pounds since his last visit. Congratulated him on this!  Encouraged ongoing weight loss with walking and decrease portion sizes.

## 2022-05-07 NOTE — Assessment & Plan Note (Signed)
He has recently lost 32 pounds and has been taking Mounjaro 10 mg weekly, metformin XR 2000 mg daily, Jardiance 25 mg daily, and Amaryl 4 mg daily.  Will check A1c, CMP, CBC, urine microalbumin today.  Eye exam up-to-date.  Foot exam done today and normal.  Continue collaboration with endocrinology.  Follow-up in 6 months.

## 2022-05-18 ENCOUNTER — Other Ambulatory Visit: Payer: Self-pay | Admitting: Endocrinology

## 2022-05-18 DIAGNOSIS — E781 Pure hyperglyceridemia: Secondary | ICD-10-CM

## 2022-05-25 ENCOUNTER — Other Ambulatory Visit (INDEPENDENT_AMBULATORY_CARE_PROVIDER_SITE_OTHER): Payer: 59

## 2022-05-25 DIAGNOSIS — E1165 Type 2 diabetes mellitus with hyperglycemia: Secondary | ICD-10-CM

## 2022-05-26 LAB — BASIC METABOLIC PANEL
BUN: 14 mg/dL (ref 6–23)
CO2: 27 mEq/L (ref 19–32)
Calcium: 9.2 mg/dL (ref 8.4–10.5)
Chloride: 101 mEq/L (ref 96–112)
Creatinine, Ser: 1.09 mg/dL (ref 0.40–1.50)
GFR: 73.5 mL/min (ref >60.00)
Glucose, Bld: 63 mg/dL — ABNORMAL LOW (ref 70–99)
Potassium: 3.9 mEq/L (ref 3.5–5.1)
Sodium: 136 mEq/L (ref 135–145)

## 2022-05-26 LAB — MICROALBUMIN / CREATININE URINE RATIO
Creatinine,U: 164.9 mg/dL
Microalb Creat Ratio: 0.4 mg/g (ref 0.0–30.0)
Microalb, Ur: <0.7 mg/dL (ref 0.0–1.9)

## 2022-05-26 LAB — HEMOGLOBIN A1C: Hgb A1c MFr Bld: 7.5 % — ABNORMAL HIGH (ref 4.6–6.5)

## 2022-06-01 ENCOUNTER — Encounter: Payer: Self-pay | Admitting: Endocrinology

## 2022-06-01 ENCOUNTER — Ambulatory Visit: Payer: 59 | Admitting: Endocrinology

## 2022-06-01 VITALS — BP 118/68 | HR 78 | Ht 74.0 in | Wt 269.2 lb

## 2022-06-01 DIAGNOSIS — E1165 Type 2 diabetes mellitus with hyperglycemia: Secondary | ICD-10-CM | POA: Diagnosis not present

## 2022-06-01 NOTE — Patient Instructions (Addendum)
Take Glimeperide before lunch on work days  Call if getting low  Check blood sugars on waking up 1 days a week  Also check blood sugars about 2 hours after meals and do this after different meals by rotation  Recommended blood sugar levels on waking up are 90-130 and about 2 hours after meal is 130-160  Please bring your blood sugar monitor to each visit, thank you

## 2022-06-01 NOTE — Progress Notes (Signed)
Patient ID: Douglas Reeves, male   DOB: September 21, 1960, 62 y.o.   MRN: [id number]           Reason for Appointment: Type II Diabetes follow-up   History of Present Illness   Diagnosis date: 2003  Previous history:  He has been on varied and multiple non-insulin diabetes medications including Trulicity Reportedly Trulicity was stopped because of difficulty with availability Insulin was not used previously   A1c range in the last few years is: 6.8-9.0  Recent history:     Non-insulin hypoglycemic drugs: Jardiance 25 mg daily, Amaryl 4 mg daily, Mounjaro 10 mg weekly, metformin ER 2 g daily     Side effects from medications: None  Current self management, blood sugar patterns and problems identified:  A1c is only slightly better at 7.5 However he has lost 7 more pounds with continuing Mounjaro He says he gets enough control of satiety with the 10 mg dose and has no side effects Again he did not bring his monitor for download His afternoon blood sugars are checked about 3 hours after lunch Forgets to check after dinner Does not feel hypoglycemic except when he does not eat in the morning and takes his glimepiride Frequently not eating breakfast Also he was fasting when he had his lab work and glucose was 63 with symptoms Likely has not done as much walking because of winter weather Taking all medications regularly  Exercise: walks up to 3 miles, 3 days a week at work  Diet management: He tends to have more snacks in the evening and sweets, usually eating sandwiches both at breakfast and lunch  Glucometer: Unknown         Blood Glucose readings by recall as below   PRE-MEAL Fasting Lunch Dinner Bedtime Overall  Glucose range: 90  130-140    Mean/median:        POST-MEAL PC Breakfast PC Lunch PC Dinner  Glucose range:     Mean/median:        Breakfast irregular, at 6 am, sometimes fruit, lunch sandwich at 11.30, dinner 5 PM; snacks in the evening including  cakes Dietician visit: Most recent:    Unknown  Weight control:  Wt Readings from Last 3 Encounters:  06/01/22 269 lb 3.2 oz (122.1 kg)  05/07/22 262 lb 12.8 oz (119.2 kg)  01/29/22 277 lb 12.8 oz (126 kg)            Diabetes labs:  Lab Results  Component Value Date   HGBA1C 7.5 (H) 05/25/2022   HGBA1C 7.9 (H) 05/07/2022   HGBA1C 7.9 (H) 01/26/2022   Lab Results  Component Value Date   MICROALBUR <0.7 05/25/2022   LDLCALC 53 05/07/2022   CREATININE 1.09 05/25/2022    No results found for: "FRUCTOSAMINE"   Allergies as of 06/01/2022   No Known Allergies      Medication List        Accurate as of June 01, 2022  4:00 PM. If you have any questions, ask your nurse or doctor.          atorvastatin 20 MG tablet Commonly known as: LIPITOR Take 1 tablet (20 mg total) by mouth at bedtime.   colesevelam 625 MG tablet Commonly known as: WELCHOL Take 1 tablet (625 mg total) by mouth daily.   empagliflozin 25 MG Tabs tablet Commonly known as: Jardiance Take 1 tablet (25 mg total) by mouth daily.   fenofibrate 160 MG tablet TAKE 1 TABLET BY MOUTH ONCE DAILY .  APPOINTMENT REQUIRED FOR FUTURE REFILLS   glimepiride 4 MG tablet Commonly known as: AMARYL Take 1 tablet (4 mg total) by mouth daily with breakfast.   losartan 50 MG tablet Commonly known as: COZAAR Take 1 tablet by mouth once daily   meclizine 25 MG tablet Commonly known as: ANTIVERT Take 1 tablet (25 mg total) by mouth 3 (three) times daily as needed for dizziness.   metFORMIN 500 MG 24 hr tablet Commonly known as: GLUCOPHAGE-XR Take 4 tablets (2,000 mg total) by mouth daily. Refill needed   Mounjaro 10 MG/0.5ML Pen Generic drug: tirzepatide INJECT 10 MG SUBCUTANEOUSLY  ONCE A WEEK        Allergies: No Known Allergies  Past Medical History:  Diagnosis Date   Diabetes mellitus    Hyperlipidemia    SBO (small bowel obstruction) ([facility]) 03/30/2018    Past Surgical History:   Procedure Laterality Date   APPENDECTOMY  1972   BACK SURGERY  1973   VASECTOMY  2000    Family History  Problem Relation Age of Onset   Diabetes Brother    Colon cancer Neg Hx     Social History:  reports that he has never smoked. He has never used smokeless tobacco. He reports that he does not drink alcohol and does not use drugs.  Review of Systems:  Last diabetic eye exam date 8/23  Last foot exam date: 1/24  Symptoms of neuropathy: None  Hypertension:   Treatment includes losartan 50 mg daily  BP Readings from Last 3 Encounters:  06/01/22 118/68  05/07/22 132/74  01/29/22 (!) 146/80    Lipid management: By PCP with atorvastatin 20 mg, WelChol 1 tablet daily and fenofibrate    Lab Results  Component Value Date   CHOL 113 05/07/2022   CHOL 136 06/25/2021   CHOL 116 11/21/2019   Lab Results  Component Value Date   HDL 45.30 05/07/2022   HDL 58.40 06/25/2021   HDL 50.50 11/21/2019   Lab Results  Component Value Date   LDLCALC 53 05/07/2022   LDLCALC 60 06/25/2021   LDLCALC 52 11/21/2019   Lab Results  Component Value Date   TRIG 74.0 05/07/2022   TRIG 87.0 06/25/2021   TRIG 68.0 11/21/2019   Lab Results  Component Value Date   CHOLHDL 3 05/07/2022   CHOLHDL 2 06/25/2021   CHOLHDL 2 11/21/2019   Lab Results  Component Value Date   LDLDIRECT 46.0 06/13/2019   LDLDIRECT 99.0 11/29/2018     Examination:   BP 118/68 (BP Location: Left Arm, Patient Position: Sitting, Cuff Size: Normal)   Pulse 78   Ht 6' 2"$  (1.88 m)   Wt 269 lb 3.2 oz (122.1 kg)   SpO2 97%   BMI 34.56 kg/m   Body mass index is 34.56 kg/m.    ASSESSMENT/ PLAN:    Diabetes type 2 with history of hypertension and hyperlipidemia:   Current regimen: Jardiance 25 mg daily, Amaryl 4 mg daily, Mounjaro 10 mg weekly, metformin ER 2 g daily  See history of present illness for detailed discussion of current diabetes management, blood sugar patterns and problems  identified  A1c is 7.5  Blood glucose control is improving and he has some more weight with the 10 mg Mounjaro dose Not clear why his A1c is still 7.5 even though he reports better readings at home on his blood sugars However not checking readings after meals and not clear how often monitoring  May benefit from nutritional counseling  Recommendations:   Continue Mounjaro 10 mg as this is working well and helping him with portion control He needs to start checking blood sugars after meals more than in the morning or before dinner Discussed blood sugar targets at different times Switch glimepiride to lunchtime when working to avoid low sugars but can take it at breakfast time on weekends Also continue Jardiance and metformin unchanged  If blood sugars start getting below 90 may reduce or stop glimepiride Follow-up in 4 months  Patient Instructions  Take Glimeperide before lunch on work days  Call if getting low  Check blood sugars on waking up 1 days a week  Also check blood sugars about 2 hours after meals and do this after different meals by rotation  Recommended blood sugar levels on waking up are 90-130 and about 2 hours after meal is 130-160  Please bring your blood sugar monitor to each visit, thank you    Elayne Snare 06/01/2022, 4:00 PM

## 2022-06-19 ENCOUNTER — Other Ambulatory Visit: Payer: Self-pay | Admitting: Nurse Practitioner

## 2022-06-22 ENCOUNTER — Other Ambulatory Visit: Payer: Self-pay | Admitting: Nurse Practitioner

## 2022-06-22 MED ORDER — ATORVASTATIN CALCIUM 20 MG PO TABS: 20.0000 mg | ORAL_TABLET | Freq: Every day | ORAL | 0 refills | Status: DC

## 2022-07-03 ENCOUNTER — Other Ambulatory Visit: Payer: Self-pay | Admitting: Nurse Practitioner

## 2022-07-06 NOTE — Telephone Encounter (Signed)
Last Refill 04-01-22 Last OV 05-07-22 FOV 11-05-22

## 2022-08-03 ENCOUNTER — Other Ambulatory Visit: Payer: Self-pay | Admitting: Endocrinology

## 2022-08-03 MED ORDER — COLESEVELAM HCL 625 MG PO TABS: 625.0000 mg | ORAL_TABLET | Freq: Every day | ORAL | 3 refills | Status: DC

## 2022-08-16 ENCOUNTER — Other Ambulatory Visit: Payer: Self-pay | Admitting: Endocrinology

## 2022-08-16 DIAGNOSIS — E1165 Type 2 diabetes mellitus with hyperglycemia: Secondary | ICD-10-CM

## 2022-08-24 ENCOUNTER — Other Ambulatory Visit: Payer: Self-pay | Admitting: Endocrinology

## 2022-08-24 DIAGNOSIS — E781 Pure hyperglyceridemia: Secondary | ICD-10-CM

## 2022-08-24 NOTE — Telephone Encounter (Signed)
Pt is going to see you on 10/01/22 ok to refill? I know you are currently out of the office. Please advise when you return.

## 2022-08-26 ENCOUNTER — Other Ambulatory Visit: Payer: Self-pay | Admitting: Endocrinology

## 2022-08-26 DIAGNOSIS — E781 Pure hyperglyceridemia: Secondary | ICD-10-CM

## 2022-09-25 ENCOUNTER — Other Ambulatory Visit: Payer: Self-pay | Admitting: Nurse Practitioner

## 2022-09-26 ENCOUNTER — Other Ambulatory Visit: Payer: Self-pay | Admitting: Endocrinology

## 2022-09-26 DIAGNOSIS — E1165 Type 2 diabetes mellitus with hyperglycemia: Secondary | ICD-10-CM

## 2022-09-28 NOTE — Telephone Encounter (Signed)
Requesting: Losartan Potassium 50 MG Oral Tablet  Last Visit: 05/07/2022 Next Visit: 11/05/2022 Last Refill: 07/06/2022  Please Advise

## 2022-10-01 ENCOUNTER — Ambulatory Visit: Payer: 59 | Admitting: "Endocrinology

## 2022-10-01 ENCOUNTER — Encounter: Payer: Self-pay | Admitting: "Endocrinology

## 2022-10-01 VITALS — BP 130/75 | HR 105 | Ht 74.0 in | Wt 260.0 lb

## 2022-10-01 DIAGNOSIS — E782 Mixed hyperlipidemia: Secondary | ICD-10-CM

## 2022-10-01 DIAGNOSIS — E1165 Type 2 diabetes mellitus with hyperglycemia: Secondary | ICD-10-CM | POA: Diagnosis not present

## 2022-10-01 DIAGNOSIS — Z7985 Long-term (current) use of injectable non-insulin antidiabetic drugs: Secondary | ICD-10-CM | POA: Diagnosis not present

## 2022-10-01 DIAGNOSIS — Z7984 Long term (current) use of oral hypoglycemic drugs: Secondary | ICD-10-CM

## 2022-10-01 LAB — POCT GLYCOSYLATED HEMOGLOBIN (HGB A1C): Hemoglobin A1C: 9.4 % — AB (ref 4.0–5.6)

## 2022-10-01 NOTE — Progress Notes (Signed)
Outpatient Endocrinology Note Douglas Midway, MD  10/01/22   Douglas Reeves [birth year] [id number]  Referring Provider: Gerre Scull, NP Primary Care Provider: Gerre Scull, NP Reason for consultation: Subjective   Assessment & Plan  [name] was seen today for diabetes.  Diagnoses and all orders for this visit:  Uncontrolled type 2 diabetes mellitus with hyperglycemia, without long-term current use of insulin (HCC) -     POCT glycosylated hemoglobin (Hb A1C) -     Ambulatory referral to Podiatry -     Ambulatory referral to diabetic education  Long-term (current) use of injectable non-insulin antidiabetic drugs  Long term (current) use of oral hypoglycemic drugs  Mixed hypercholesterolemia and hypertriglyceridemia   Diabetes Type II complicated by neuropathy  Lab Results  Component Value Date   GFR 73.50 05/25/2022   Hba1c goal less than 7, current Hba1c is  Lab Results  Component Value Date   HGBA1C 9.4 (A) 10/01/2022   Will recommend the following: Jardiance 25 mg daily, Amaryl 4-8 mg daily, Mounjaro 10 mg weekly, metformin ER 2 g daily  Previous history:  He has been on varied and multiple non-insulin diabetes medications including Trulicity Reportedly Trulicity was stopped because of difficulty with availability Insulin was not used previously   No known contraindications to any of above medications  -Last LD and Tg are as follows: Lab Results  Component Value Date   LDLCALC 53 05/07/2022    Lab Results  Component Value Date   TRIG 74.0 05/07/2022   -Recommend atorvastatin 20 mg every day, fenofibrate 160 mg  -Follow low fat diet and exercise   -Blood pressure goal <140/90 - Microalbumin/creatinine goal < 30 -Last MA/Cr is as follows: Lab Results  Component Value Date   MICROALBUR <0.7 05/25/2022   -on ACE/ARB losartan 50 mg qd -diet changes including salt restriction -limit eating outside -counseled BP targets per standards  of diabetes care -uncontrolled blood pressure can lead to retinopathy, nephropathy and cardiovascular and atherosclerotic heart disease  Reviewed and counseled on: -A1C target -Blood sugar targets -Complications of uncontrolled diabetes  -Checking blood sugar before meals and bedtime and bring log next visit -All medications with mechanism of action and side effects -Hypoglycemia management: rule of 15's, Glucagon Emergency Kit and medical alert ID -low-carb low-fat plate-method diet -At least 20 minutes of physical activity per day -Annual dilated retinal eye exam and foot exam -compliance and follow up needs -follow up as scheduled or earlier if problem gets worse  Call if blood sugar is less than 70 or consistently above 250    Take a 15 gm snack of carbohydrate at bedtime before you go to sleep if your blood sugar is less than 100.    If you are going to fast after midnight for a test or procedure, ask your physician for instructions on how to reduce/decrease your insulin dose.    Call if blood sugar is less than 70 or consistently above 250  -Treating a low sugar by rule of 15  (15 gms of sugar every 15 min until sugar is more than 70) If you feel your sugar is low, test your sugar to be sure If your sugar is low (less than 70), then take 15 grams of a fast acting Carbohydrate (3-4 glucose tablets or glucose gel or 4 ounces of juice or regular soda) Recheck your sugar 15 min after treating low to make sure it is more than 70 If sugar is still less than 70,  treat again with 15 grams of carbohydrate          Don't drive the hour of hypoglycemia  If unconscious/unable to eat or drink by mouth, use glucagon injection or nasal spray baqsimi and call 911. Can repeat again in 15 min if still unconscious.  Return in about 6 weeks (around 11/12/2022).   I have reviewed current medications, nurse's notes, allergies, vital signs, past medical and surgical history, family medical history,  and social history for this encounter. Counseled patient on symptoms, examination findings, lab findings, imaging results, treatment decisions and monitoring and prognosis. The patient understood the recommendations and agrees with the treatment plan. All questions regarding treatment plan were fully answered.  Douglas Cabin John, MD  10/01/22    History of Present Illness Douglas Reeves is a 62 y.o. year old male who presents for evaluation of Type II diabetes mellitus.  Sai Licursi was first diagnosed in 2003.   Diabetes education +  Home diabetes regimen: Jardiance 25 mg daily, Amaryl 4 mg daily, Mounjaro 10 mg weekly, metformin ER 2 g daily  Previous history:  He has been on varied and multiple non-insulin diabetes medications including Trulicity Reportedly Trulicity was stopped because of difficulty with availability Insulin was not used previously   COMPLICATIONS -  MI/Stroke -  retinopathy +  neuropathy -  nephropathy  BLOOD SUGAR DATA Did not bring meter   Physical Exam  BP 130/75   Pulse (!) 105   Ht 6\' 2"  (1.88 m)   Wt 260 lb (117.9 kg)   SpO2 99%   BMI 33.38 kg/m    Constitutional: well developed, well nourished Head: normocephalic, atraumatic Eyes: sclera anicteric, no redness Neck: supple Lungs: normal respiratory effort Neurology: alert and oriented Skin: dry, no appreciable rashes Musculoskeletal: no appreciable defects Psychiatric: normal mood and affect Diabetic Foot Exam - Simple   No data filed      Current Medications Patient's Medications  New Prescriptions   No medications on file  Previous Medications   ATORVASTATIN (LIPITOR) 20 MG TABLET    Take 1 tablet (20 mg total) by mouth at bedtime.   COLESEVELAM (WELCHOL) 625 MG TABLET    Take 1 tablet (625 mg total) by mouth daily.   FENOFIBRATE 160 MG TABLET    TAKE 1 TABLET BY MOUTH ONCE DAILY . APPOINTMENT REQUIRED FOR FUTURE REFILLS   GLIMEPIRIDE (AMARYL) 4 MG TABLET    Take 1 tablet  by mouth once daily with breakfast   JARDIANCE 25 MG TABS TABLET    Take 1 tablet by mouth once daily   LOSARTAN (COZAAR) 50 MG TABLET    Take 1 tablet by mouth once daily   MECLIZINE (ANTIVERT) 25 MG TABLET    Take 1 tablet (25 mg total) by mouth 3 (three) times daily as needed for dizziness.   METFORMIN (GLUCOPHAGE-XR) 500 MG 24 HR TABLET    TAKE 4 TABLETS BY MOUTH ONCE DAILY. REFILL NEEDED.   MOUNJARO 10 MG/0.5ML PEN    INJECT 10 MG SUBCUTANEOUSLY  ONCE A WEEK  Modified Medications   No medications on file  Discontinued Medications   No medications on file    Allergies No Known Allergies  Past Medical History Past Medical History:  Diagnosis Date   Diabetes mellitus    Hyperlipidemia    SBO (small bowel obstruction) ([facility]) 03/30/2018    Past Surgical History Past Surgical History:  Procedure Laterality Date   APPENDECTOMY  1972   BACK SURGERY  1973   VASECTOMY  2000    Family History family history includes Diabetes in his brother.  Social History Social History   Socioeconomic History   Marital status: Married    Spouse name: Not on file   Number of children: Not on file   Years of education: Not on file   Highest education level: Not on file  Occupational History   Not on file  Tobacco Use   Smoking status: Never   Smokeless tobacco: Never  Vaping Use   Vaping Use: Never used  Substance and Sexual Activity   Alcohol use: No    Alcohol/week: 0.0 standard drinks of alcohol   Drug use: No   Sexual activity: Not on file  Other Topics Concern   Not on file  Social History Narrative   Not on file   Social Determinants of Health   Financial Resource Strain: Not on file  Food Insecurity: Not on file  Transportation Needs: Not on file  Physical Activity: Not on file  Stress: Not on file  Social Connections: Not on file  Intimate Partner Violence: Not on file    Lab Results  Component Value Date   HGBA1C 9.4 (A) 10/01/2022   HGBA1C 7.5 (H)  05/25/2022   HGBA1C 7.9 (H) 05/07/2022   Lab Results  Component Value Date   CHOL 113 05/07/2022   Lab Results  Component Value Date   HDL 45.30 05/07/2022   Lab Results  Component Value Date   LDLCALC 53 05/07/2022   Lab Results  Component Value Date   TRIG 74.0 05/07/2022   Lab Results  Component Value Date   CHOLHDL 3 05/07/2022   Lab Results  Component Value Date   CREATININE 1.09 05/25/2022   Lab Results  Component Value Date   GFR 73.50 05/25/2022   Lab Results  Component Value Date   MICROALBUR <0.7 05/25/2022      Component Value Date/Time   NA 136 05/25/2022 1516   K 3.9 05/25/2022 1516   CL 101 05/25/2022 1516   CO2 27 05/25/2022 1516   GLUCOSE 63 (L) 05/25/2022 1516   GLUCOSE 177 (H) 03/08/2006 0807   BUN 14 05/25/2022 1516   CREATININE 1.09 05/25/2022 1516   CREATININE 0.99 10/04/2015 1717   CALCIUM 9.2 05/25/2022 1516   PROT 7.5 05/07/2022 1323   ALBUMIN 4.6 05/07/2022 1323   AST 18 05/07/2022 1323   ALT 17 05/07/2022 1323   ALKPHOS 39 05/07/2022 1323   BILITOT 0.6 05/07/2022 1323   GFRNONAA >60 05/26/2021 0236   GFRAA >60 04/01/2018 0442      Latest Ref Rng & Units 05/25/2022    3:16 PM 05/07/2022    1:23 PM 01/26/2022    3:36 PM  BMP  Glucose 70 - 99 mg/dL 63  84  161   BUN 6 - 23 mg/dL 14  27  16    Creatinine 0.40 - 1.50 mg/dL 0.96  0.45  4.09   Sodium 135 - 145 mEq/L 136  138  137   Potassium 3.5 - 5.1 mEq/L 3.9  4.4  4.1   Chloride 96 - 112 mEq/L 101  100  103   CO2 19 - 32 mEq/L 27  27  27    Calcium 8.4 - 10.5 mg/dL 9.2  9.5  9.2        Component Value Date/Time   WBC 5.7 05/07/2022 1323   RBC 5.05 05/07/2022 1323   HGB 14.8 05/07/2022 1323   HCT 44.2  05/07/2022 1323   PLT 326.0 05/07/2022 1323   MCV 87.6 05/07/2022 1323   MCH 29.8 05/26/2021 0236   MCHC 33.5 05/07/2022 1323   RDW 14.3 05/07/2022 1323   LYMPHSABS 1.3 05/26/2021 0236   MONOABS 0.6 05/26/2021 0236   EOSABS 0.0 05/26/2021 0236   BASOSABS 0.0 05/26/2021  0236     Parts of this note may have been dictated using voice recognition software. There may be variances in spelling and vocabulary which are unintentional. Not all errors are proofread. Please notify the Thereasa Parkin if any discrepancies are noted or if the meaning of any statement is not clear.

## 2022-10-01 NOTE — Patient Instructions (Signed)
Jardiance 25 mg daily, Amaryl 4-8 mg daily, Mounjaro 10 mg weekly, metformin ER 2 g daily _______________  Goals of DM therapy:  Morning Fasting blood sugar: 80-140  Blood sugar before meals: 80-140 Bed time blood sugar: 100-150  A1C <7%, limited only by hypoglycemia  1.Diabetes medications and their side effects discussed, including hypoglycemia    2. Check blood glucose:  a) Always check blood sugars before driving. Please see below (under hypoglycemia) on how to manage b) Check a minimum of 3 times/day or more as needed when having symptoms of hypoglycemia.   c) Try to check blood glucose before sleeping/in the middle of the night to ensure that it is remaining stable and not dropping less than 100 d) Check blood glucose more often if sick  3. Diet: a) 3 meals per day schedule b: Restrict carbs to 60-70 grams (4 servings) per meal c) Colorful vegetables - 3 servings a day, and low sugar fruit 2 servings/day Plate control method: 1/4 plate protein, 1/4 starch, 1/2 green, yellow, or red vegetables d) Avoid carbohydrate snacks unless hypoglycemic episode, or increased physical activity  4. Regular exercise as tolerated, preferably 3 or more hours a week  5. Hypoglycemia: a)  Do not drive or operate machinery without first testing blood glucose to assure it is over 90 mg%, or if dizzy, lightheaded, not feeling normal, etc, or  if foot or leg is numb or weak. b)  If blood glucose less than 70, take four 5gm Glucose tabs or 15-30 gm Glucose gel.  Repeat every 15 min as needed until blood sugar is >100 mg/dl. If hypoglycemia persists then call 911.   6. Sick day management: a) Check blood glucose more often b) Continue usual therapy if blood sugars are elevated.   7. Contact the doctor immediately if blood glucose is frequently <60 mg/dl, or an episode of severe hypoglycemia occurs (where someone had to give you glucose/  glucagon or if you passed out from a low blood glucose), or  if blood glucose is persistently >350 mg/dl, for further management  8. A change in level of physical activity or exercise and a change in diet may also affect your blood sugar. Check blood sugars more often and call if needed.  Instructions: 1. Bring glucose meter, blood glucose records on every visit for review 2. Continue to follow up with primary care physician and other providers for medical care 3. Yearly eye  and foot exam 4. Please get blood work done prior to the next appointment

## 2022-10-04 ENCOUNTER — Other Ambulatory Visit: Payer: Self-pay | Admitting: Nurse Practitioner

## 2022-10-08 NOTE — Telephone Encounter (Signed)
Requesting: Atorvastatin Calcium 20 MG Oral Tablet  Last Visit: 05/07/2022 Next Visit: 11/05/2022 Last Refill: 06/22/2022  Please Advise

## 2022-10-13 ENCOUNTER — Encounter: Payer: Self-pay | Admitting: Podiatry

## 2022-10-13 ENCOUNTER — Ambulatory Visit: Payer: 59 | Admitting: Podiatry

## 2022-10-13 DIAGNOSIS — D2372 Other benign neoplasm of skin of left lower limb, including hip: Secondary | ICD-10-CM

## 2022-10-13 DIAGNOSIS — D2371 Other benign neoplasm of skin of right lower limb, including hip: Secondary | ICD-10-CM | POA: Diagnosis not present

## 2022-10-13 DIAGNOSIS — M79676 Pain in unspecified toe(s): Secondary | ICD-10-CM | POA: Diagnosis not present

## 2022-10-13 DIAGNOSIS — B351 Tinea unguium: Secondary | ICD-10-CM

## 2022-10-13 DIAGNOSIS — E1142 Type 2 diabetes mellitus with diabetic polyneuropathy: Secondary | ICD-10-CM | POA: Diagnosis not present

## 2022-10-13 NOTE — Progress Notes (Signed)
  Subjective:  Patient ID: Douglas Reeves, male    DOB: July 03, 1960,  MRN: [id number] HPI Chief Complaint  Patient presents with   Diabetes    Foot exam - numbness in right foot, always gets a bruised-like rub on the tip of the right hallux, last A1c was 9.4   New Patient (Initial Visit)    62 y.o. male presents with the above complaint.   ROS: Denies fever chills nausea vomit muscle aches pains calf pain back pain chest pain shortness of breath  Past Medical History:  Diagnosis Date   Diabetes mellitus    Hyperlipidemia    SBO (small bowel obstruction) ([facility]) 03/30/2018   Past Surgical History:  Procedure Laterality Date   APPENDECTOMY  1972   BACK SURGERY  1973   VASECTOMY  2000    Current Outpatient Medications:    atorvastatin (LIPITOR) 20 MG tablet, TAKE 1 TABLET BY MOUTH AT BEDTIME, Disp: 90 tablet, Rfl: 0   colesevelam (WELCHOL) 625 MG tablet, Take 1 tablet (625 mg total) by mouth daily., Disp: 90 tablet, Rfl: 3   fenofibrate 160 MG tablet, TAKE 1 TABLET BY MOUTH ONCE DAILY . APPOINTMENT REQUIRED FOR FUTURE REFILLS, Disp: 36 tablet, Rfl: 0   glimepiride (AMARYL) 4 MG tablet, Take 1 tablet by mouth once daily with breakfast, Disp: 90 tablet, Rfl: 0   JARDIANCE 25 MG TABS tablet, Take 1 tablet by mouth once daily, Disp: 90 tablet, Rfl: 0   losartan (COZAAR) 50 MG tablet, Take 1 tablet by mouth once daily, Disp: 90 tablet, Rfl: 0   meclizine (ANTIVERT) 25 MG tablet, Take 1 tablet (25 mg total) by mouth 3 (three) times daily as needed for dizziness., Disp: 30 tablet, Rfl: 0   metFORMIN (GLUCOPHAGE-XR) 500 MG 24 hr tablet, TAKE 4 TABLETS BY MOUTH ONCE DAILY. REFILL NEEDED., Disp: 360 tablet, Rfl: 0   MOUNJARO 10 MG/0.5ML Pen, INJECT 10 MG SUBCUTANEOUSLY  ONCE A WEEK, Disp: 12 mL, Rfl: 1  No Known Allergies Review of Systems Objective:  There were no vitals filed for this visit.  General: Well developed, nourished, in no acute distress, alert and oriented x3    Dermatological: Skin is warm, dry and supple bilateral. Nails x 10 are thick yellow dystrophic clinically mycotic remaining integument appears unremarkable at this time. There are no open sores, no preulcerative lesions, no rash or signs of infection present.  Vascular: Dorsalis Pedis artery and Posterior Tibial artery pedal pulses are 2/4 bilateral with immedate capillary fill time. Pedal hair growth present. No varicosities and no lower extremity edema present bilateral.   Neruologic: Grossly intact via light touch bilateral. Vibratory intact via tuning fork bilateral. Protective threshold with Semmes Wienstein monofilament intact to all pedal sites bilateral. Patellar and Achilles deep tendon reflexes 2+ bilateral. No Babinski or clonus noted bilateral.   Musculoskeletal: No gross boney pedal deformities bilateral. No pain, crepitus, or limitation noted with foot and ankle range of motion bilateral. Muscular strength 5/5 in all groups tested bilateral.  Gait: Unassisted, Nonantalgic.    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  Plan: Discussed etiology pathology conservative versus surgical therapies at this point debrided toenails 1 through 5 bilateral covered service secondary to pain     Dreonna [employer] [place], [place]

## 2022-10-18 ENCOUNTER — Other Ambulatory Visit: Payer: Self-pay | Admitting: Endocrinology

## 2022-10-18 DIAGNOSIS — E781 Pure hyperglyceridemia: Secondary | ICD-10-CM

## 2022-10-19 ENCOUNTER — Other Ambulatory Visit: Payer: Self-pay | Admitting: Endocrinology

## 2022-10-19 DIAGNOSIS — E781 Pure hyperglyceridemia: Secondary | ICD-10-CM

## 2022-10-20 ENCOUNTER — Other Ambulatory Visit: Payer: Self-pay | Admitting: Endocrinology

## 2022-10-20 DIAGNOSIS — E781 Pure hyperglyceridemia: Secondary | ICD-10-CM

## 2022-10-22 ENCOUNTER — Other Ambulatory Visit: Payer: Self-pay | Admitting: Endocrinology

## 2022-10-22 DIAGNOSIS — E781 Pure hyperglyceridemia: Secondary | ICD-10-CM

## 2022-10-31 ENCOUNTER — Other Ambulatory Visit: Payer: Self-pay | Admitting: Endocrinology

## 2022-11-05 ENCOUNTER — Ambulatory Visit: Payer: 59 | Admitting: Nurse Practitioner

## 2022-11-05 ENCOUNTER — Encounter: Payer: Self-pay | Admitting: Nurse Practitioner

## 2022-11-05 VITALS — BP 138/62 | HR 83 | Temp 98.5°F | Ht 74.0 in | Wt 261.0 lb

## 2022-11-05 DIAGNOSIS — Z7984 Long term (current) use of oral hypoglycemic drugs: Secondary | ICD-10-CM | POA: Diagnosis not present

## 2022-11-05 DIAGNOSIS — E1169 Type 2 diabetes mellitus with other specified complication: Secondary | ICD-10-CM

## 2022-11-05 DIAGNOSIS — Z7985 Long-term (current) use of injectable non-insulin antidiabetic drugs: Secondary | ICD-10-CM

## 2022-11-05 DIAGNOSIS — E1159 Type 2 diabetes mellitus with other circulatory complications: Secondary | ICD-10-CM | POA: Diagnosis not present

## 2022-11-05 DIAGNOSIS — E669 Obesity, unspecified: Secondary | ICD-10-CM | POA: Diagnosis not present

## 2022-11-05 DIAGNOSIS — E785 Hyperlipidemia, unspecified: Secondary | ICD-10-CM

## 2022-11-05 DIAGNOSIS — I152 Hypertension secondary to endocrine disorders: Secondary | ICD-10-CM

## 2022-11-05 LAB — COMPREHENSIVE METABOLIC PANEL
ALT: 25 U/L (ref 0–53)
AST: 20 U/L (ref 0–37)
Albumin: 4.4 g/dL (ref 3.5–5.2)
Alkaline Phosphatase: 82 U/L (ref 39–117)
BUN: 13 mg/dL (ref 6–23)
CO2: 27 mEq/L (ref 19–32)
Calcium: 9.4 mg/dL (ref 8.4–10.5)
Chloride: 101 mEq/L (ref 96–112)
Creatinine, Ser: 0.91 mg/dL (ref 0.40–1.50)
GFR: 90.98 mL/min (ref >60.00)
Glucose, Bld: 132 mg/dL — ABNORMAL HIGH (ref 70–99)
Potassium: 4.2 mEq/L (ref 3.5–5.1)
Sodium: 136 mEq/L (ref 135–145)
Total Bilirubin: 0.6 mg/dL (ref 0.2–1.2)
Total Protein: 6.8 g/dL (ref 6.0–8.3)

## 2022-11-05 LAB — CBC WITH DIFFERENTIAL/PLATELET
Basophils Absolute: 0 10*3/uL (ref 0.0–0.1)
Basophils Relative: 0.7 % (ref 0.0–3.0)
Eosinophils Absolute: 0.3 10*3/uL (ref 0.0–0.7)
Eosinophils Relative: 5.4 % — ABNORMAL HIGH (ref 0.0–5.0)
HCT: 44.1 % (ref 39.0–52.0)
Hemoglobin: 14 g/dL (ref 13.0–17.0)
Lymphocytes Relative: 33 % (ref 12.0–46.0)
Lymphs Abs: 1.9 10*3/uL (ref 0.7–4.0)
MCHC: 31.8 g/dL (ref 30.0–36.0)
MCV: 89.7 fl (ref 78.0–100.0)
Monocytes Absolute: 0.6 10*3/uL (ref 0.1–1.0)
Monocytes Relative: 10.6 % (ref 3.0–12.0)
Neutro Abs: 2.9 10*3/uL (ref 1.4–7.7)
Neutrophils Relative %: 50.3 % (ref 43.0–77.0)
Platelets: 313 10*3/uL (ref 150.0–400.0)
RBC: 4.92 Mil/uL (ref 4.22–5.81)
RDW: 14.6 % (ref 11.5–15.5)
WBC: 5.8 10*3/uL (ref 4.0–10.5)

## 2022-11-05 LAB — HEMOGLOBIN A1C: Hgb A1c MFr Bld: 8.6 % — ABNORMAL HIGH (ref 4.6–6.5)

## 2022-11-05 LAB — LIPID PANEL
Cholesterol: 115 mg/dL (ref 0–200)
HDL: 45 mg/dL (ref >39.00)
LDL Cholesterol: 40 mg/dL (ref 0–99)
NonHDL: 69.6
Total CHOL/HDL Ratio: 3
Triglycerides: 146 mg/dL (ref 0.0–149.0)
VLDL: 29.2 mg/dL (ref 0.0–40.0)

## 2022-11-05 MED ORDER — FENOFIBRATE 160 MG PO TABS
ORAL_TABLET | ORAL | 1 refills | Status: DC
Start: 2022-11-05 — End: 2023-05-07

## 2022-11-05 NOTE — Progress Notes (Signed)
Established Patient Office Visit  Subjective   Patient ID: Douglas Reeves, male    DOB: May 24, 1960  Age: 62 y.o. MRN: [id number]  Chief Complaint  Patient presents with   Hypertension associated with Diabetes    Follow up and Rx refill, no concerns    HPI  Douglas Reeves is here to follow-up on hypertension and diabetes.   DIABETES  Hypoglycemic episodes:no Polydipsia/polyuria: no Visual disturbance: no Chest pain: no Paresthesias: no Glucose Monitoring: yes  Accucheck frequency: Daily Glucose: 90s-200s Taking Insulin?: no Blood Pressure Monitoring: not checking Retinal Examination: Up to Date Foot Exam: Up to Date Diabetic Education: Completed Pneumovax: Up to Date Influenza: Up to Date Aspirin: no  ROS See pertinent positives and negatives per HPI.    Objective:     BP 138/62 (BP Location: Left Arm)   Pulse 83   Temp 98.5 F (36.9 C) (Oral)   Ht 6\' 2"  (1.88 m)   Wt 261 lb (118.4 kg)   SpO2 95%   BMI 33.51 kg/m     Physical Exam Vitals and nursing note reviewed.  Constitutional:      Appearance: Normal appearance.  HENT:     Head: Normocephalic.  Eyes:     Conjunctiva/sclera: Conjunctivae normal.  Cardiovascular:     Rate and Rhythm: Normal rate and regular rhythm.     Pulses: Normal pulses.     Heart sounds: Normal heart sounds.  Pulmonary:     Effort: Pulmonary effort is normal.     Breath sounds: Normal breath sounds.  Musculoskeletal:     Cervical back: Normal range of motion.  Skin:    General: Skin is warm.  Neurological:     General: No focal deficit present.     Mental Status: He is alert and oriented to person, place, and time.  Psychiatric:        Mood and Affect: Mood normal.        Behavior: Behavior normal.        Thought Content: Thought content normal.        Judgment: Judgment normal.       Assessment & Plan:   Problem List Items Addressed This Visit       Cardiovascular and Mediastinum   Hypertension  associated with diabetes (HCC) - Primary    Chronic, stable.  BP today 138/62.  Continue losartan 50 mg daily.  Check CMP, CBC.  Follow-up in 6 months.      Relevant Medications   fenofibrate 160 MG tablet     Endocrine   Hyperlipidemia associated with type 2 diabetes mellitus (HCC)    Chronic, stable.  Continue atorvastatin 20 mg daily, fenofibrate 160mg  daily, and WelChol 625 mg daily.  Check CMP, CBC, lipid panel today.  Follow-up in 6 months.      Relevant Medications   fenofibrate 160 MG tablet   Other Relevant Orders   Lipid panel   Type 2 diabetes mellitus with obesity (HCC)    Chronic, not controlled.  He is still following with endocrine.  He states that his sugars have ranged from 90-200 at home.  Will have him continue glimepiride 4 mg daily, Jardiance 25 mg daily, metformin XR 2000 mg daily, welchol 625mg  daily, and Mounjaro 10 mg injection weekly.  He is up-to-date on pneumonia and flu vaccines along with foot exam.  Check CMP, CBC, A1c today.  Continue collaboration recommendations from endocrinology.      Relevant Orders   CBC with Differential/Platelet  Comprehensive metabolic panel   Hemoglobin A1c     Other   Long-term current use of injectable noninsulin antidiabetic medication    Continue Mounjaro 10 mg injection weekly      Long term current use of oral hypoglycemic drug    Continue glimepiride 4 mg daily, Jardiance 25 mg daily, and metformin XR 2000 mg daily       Return in about 6 months (around 05/08/2023) for CPE.    Gerre Scull, NP

## 2022-11-05 NOTE — Patient Instructions (Signed)
It was great to see you!  We are checking your labs today and will let you know the results via mychart/phone.   I have refilled your fenofibrate.   Keep up the great work  Let's follow-up in 6 months, sooner if you have concerns.  If a referral was placed today, you will be contacted for an appointment. Please note that routine referrals can sometimes take up to 3-4 weeks to process. Please call our office if you haven't heard anything after this time frame.  Take care,  Rodman Pickle, NP

## 2022-11-05 NOTE — Assessment & Plan Note (Addendum)
Chronic, stable.  Continue atorvastatin 20 mg daily, fenofibrate 160mg  daily, and WelChol 625 mg daily.  Check CMP, CBC, lipid panel today.  Follow-up in 6 months.

## 2022-11-05 NOTE — Assessment & Plan Note (Addendum)
Chronic, not controlled.  He is still following with endocrine.  He states that his sugars have ranged from 90-200 at home.  Will have him continue glimepiride 4 mg daily, Jardiance 25 mg daily, metformin XR 2000 mg daily, welchol 625mg  daily, and Mounjaro 10 mg injection weekly.  He is up-to-date on pneumonia and flu vaccines along with foot exam.  Check CMP, CBC, A1c today.  Continue collaboration recommendations from endocrinology.

## 2022-11-05 NOTE — Assessment & Plan Note (Signed)
Continue glimepiride 4 mg daily, Jardiance 25 mg daily, and metformin XR 2000 mg daily

## 2022-11-05 NOTE — Assessment & Plan Note (Signed)
Chronic, stable.  BP today 138/62.  Continue losartan 50 mg daily.  Check CMP, CBC.  Follow-up in 6 months.

## 2022-11-05 NOTE — Assessment & Plan Note (Signed)
Continue Mounjaro 10 mg injection weekly

## 2022-11-20 ENCOUNTER — Other Ambulatory Visit: Payer: Self-pay | Admitting: Endocrinology

## 2022-11-20 DIAGNOSIS — E1165 Type 2 diabetes mellitus with hyperglycemia: Secondary | ICD-10-CM

## 2022-12-05 ENCOUNTER — Other Ambulatory Visit: Payer: Self-pay | Admitting: Endocrinology

## 2022-12-20 ENCOUNTER — Other Ambulatory Visit: Payer: Self-pay | Admitting: Nurse Practitioner

## 2022-12-22 NOTE — Telephone Encounter (Addendum)
Requesting: Losartan Potassium 50 MG Oral Tablet  Last Visit: 11/05/2022 Next Visit: 05/11/2023 Last Refill: 09/28/2022  Please Advise   To follow up in 6 months

## 2022-12-30 ENCOUNTER — Other Ambulatory Visit: Payer: Self-pay | Admitting: Nurse Practitioner

## 2023-01-01 NOTE — Telephone Encounter (Signed)
Requesting: Atorvastatin Calcium 20 MG Oral Tablet  Last Visit: 11/05/2022 Next Visit: 05/11/2023 Last Refill: 10/08/2022  Please Advise

## 2023-01-07 ENCOUNTER — Telehealth: Payer: Self-pay | Admitting: Nurse Practitioner

## 2023-01-07 DIAGNOSIS — E1165 Type 2 diabetes mellitus with hyperglycemia: Secondary | ICD-10-CM

## 2023-01-07 NOTE — Telephone Encounter (Signed)
01/08/23 - Pt called stating that the pharmacy is waiting on the prior autho from the PCP for the medication (glimepiride (AMARYL) 4 MG tablet [[id number]] ) to be sent to them before they can give out.   [facility] - [place] ([place]), [place] - [address]

## 2023-01-07 NOTE — Telephone Encounter (Signed)
Can we get a prior auth on glimepiride (AMARYL) 4 MG tablet ?

## 2023-01-08 ENCOUNTER — Other Ambulatory Visit (HOSPITAL_COMMUNITY): Payer: Self-pay

## 2023-01-08 MED ORDER — GLIMEPIRIDE 4 MG PO TABS: 4.0000 mg | ORAL_TABLET | Freq: Every day | ORAL | 0 refills | Status: DC

## 2023-01-08 NOTE — Telephone Encounter (Signed)
Unable to leave a message as mailbox is full.

## 2023-01-08 NOTE — Telephone Encounter (Signed)
Patient called back and I told him that Rx was refilled.

## 2023-01-08 NOTE — Telephone Encounter (Signed)
I called [facility] and patient's medication does not need a prior auth per pharmacy tech, it only needs refills.   Requesting: glimepiride (AMARYL) 4 MG tablet  Last Visit: 11/05/2022 Next Visit: 05/11/2023 Last Refill: 09/28/2022  Please Advise

## 2023-02-11 ENCOUNTER — Other Ambulatory Visit: Payer: Self-pay

## 2023-02-11 DIAGNOSIS — E1165 Type 2 diabetes mellitus with hyperglycemia: Secondary | ICD-10-CM

## 2023-02-11 MED ORDER — EMPAGLIFLOZIN 25 MG PO TABS
25.0000 mg | ORAL_TABLET | Freq: Every day | ORAL | 0 refills | Status: DC
Start: 2023-02-11 — End: 2023-05-10

## 2023-02-18 ENCOUNTER — Other Ambulatory Visit: Payer: Self-pay

## 2023-03-03 ENCOUNTER — Other Ambulatory Visit: Payer: Self-pay | Admitting: Nurse Practitioner

## 2023-03-03 ENCOUNTER — Other Ambulatory Visit: Payer: Self-pay | Admitting: "Endocrinology

## 2023-03-03 DIAGNOSIS — E1165 Type 2 diabetes mellitus with hyperglycemia: Secondary | ICD-10-CM

## 2023-03-05 LAB — AMB RESULTS CONSOLE CBG: Glucose: 96

## 2023-03-05 NOTE — Telephone Encounter (Signed)
Requesting: Glimepiride 4 MG Oral Tablet  Last Visit: 11/05/2022 Next Visit: 06/16/2023 Last Refill: 01/08/2023  Please Advise

## 2023-03-08 ENCOUNTER — Other Ambulatory Visit: Payer: Self-pay | Admitting: "Endocrinology

## 2023-03-08 DIAGNOSIS — E1165 Type 2 diabetes mellitus with hyperglycemia: Secondary | ICD-10-CM

## 2023-03-09 ENCOUNTER — Telehealth: Payer: Self-pay | Admitting: Nurse Practitioner

## 2023-03-09 LAB — HM DIABETES EYE EXAM

## 2023-03-09 NOTE — Telephone Encounter (Addendum)
Requesting: metFORMIN (GLUCOPHAGE-XR) 500 MG 24 hr tablet  Last Visit: 11/05/2022 Next Visit: 06/16/2023 Last Refill: 11/20/2022 by Dr. Altamese Gallant   Please Advise

## 2023-03-09 NOTE — Telephone Encounter (Signed)
Mailbox full and unable to leave patient a message.

## 2023-03-09 NOTE — Telephone Encounter (Signed)
Prescription Request  03/09/2023  LOV: 11/05/2022  What is the name of the medication or equipment? metFORMIN (GLUCOPHAGE-XR) 500 MG 24 hr tablet [[id number]]   Have you contacted your pharmacy to request a refill? Yes, he's says should be filling this.   Which pharmacy would you like this sent to?  [facility] - [place] ([place]), [place] - [address] [place] ([place]) [place] [address] Phone: [phone] Fax: [phone]    Patient notified that their request is being sent to the clinical staff for review and that they should receive a response within 2 business days.   Please advise at [facility] [phone]

## 2023-03-10 ENCOUNTER — Encounter: Payer: Self-pay | Admitting: Nurse Practitioner

## 2023-03-10 NOTE — Telephone Encounter (Signed)
Mailbox full and unable to leave patient a message.

## 2023-03-15 NOTE — Telephone Encounter (Signed)
Patient notified of below message and will call Dr. Grafton Folk office when he get back from vacation.

## 2023-03-31 ENCOUNTER — Encounter: Payer: Self-pay | Admitting: "Endocrinology

## 2023-03-31 ENCOUNTER — Ambulatory Visit: Payer: 59 | Admitting: "Endocrinology

## 2023-03-31 VITALS — BP 136/60 | HR 86 | Ht 74.0 in | Wt 266.6 lb

## 2023-03-31 DIAGNOSIS — E1165 Type 2 diabetes mellitus with hyperglycemia: Secondary | ICD-10-CM | POA: Diagnosis not present

## 2023-03-31 DIAGNOSIS — Z7984 Long term (current) use of oral hypoglycemic drugs: Secondary | ICD-10-CM | POA: Diagnosis not present

## 2023-03-31 DIAGNOSIS — E782 Mixed hyperlipidemia: Secondary | ICD-10-CM

## 2023-03-31 DIAGNOSIS — Z7985 Long-term (current) use of injectable non-insulin antidiabetic drugs: Secondary | ICD-10-CM

## 2023-03-31 LAB — POCT GLYCOSYLATED HEMOGLOBIN (HGB A1C): Hemoglobin A1C: 7.7 % — AB (ref 4.0–5.6)

## 2023-03-31 MED ORDER — DEXCOM G7 SENSOR MISC: 1.0000 | 1 refills | Status: DC

## 2023-03-31 MED ORDER — TIRZEPATIDE 12.5 MG/0.5ML ~~LOC~~ SOAJ: 12.5000 mg | SUBCUTANEOUS | 0 refills | Status: DC

## 2023-03-31 NOTE — Progress Notes (Signed)
Outpatient Endocrinology Note Altamese , MD  03/31/23   Douglas Reeves January 20, 1961 [id number]  Referring Provider: Gerre Scull, NP Primary Care Provider: Gerre Scull, NP Reason for consultation: Subjective   Assessment & Plan  Diagnoses and all orders for this visit:  Uncontrolled type 2 diabetes mellitus with hyperglycemia, without long-term current use of insulin (HCC) -     POCT glycosylated hemoglobin (Hb A1C)  Long-term (current) use of injectable non-insulin antidiabetic drugs  Long term (current) use of oral hypoglycemic drugs  Mixed hypercholesterolemia and hypertriglyceridemia  Other orders -     tirzepatide (MOUNJARO) 12.5 MG/0.5ML Pen; Inject 12.5 mg into the skin once a week. -     Continuous Glucose Sensor (DEXCOM G7 SENSOR) MISC; 1 Device by Does not apply route continuous.   Diabetes Type II complicated by neuropathy  Lab Results  Component Value Date   GFR 90.98 11/05/2022   Hba1c goal less than 7, current Hba1c is 7.7 on 04/10/23   Lab Results  Component Value Date   HGBA1C 7.7 (A) 03/31/2023   Will recommend the following: Jardiance 25 mg daily, Mounjaro 12.5 mg weekly, metformin ER 2 g daily Stop Amaryl 4 mg daily if BG <70  Ordered DM education previously, did not happen   Seen podiatry foot exam on  10/13/2022 by Elinor Parkinson, DPM  Previous history:  He has been on varied and multiple non-insulin diabetes medications including Trulicity Reportedly Trulicity was stopped because of difficulty with availability Insulin was not used previously   No known contraindications to any of above medications  -Last LD and Tg are as follows: Lab Results  Component Value Date   LDLCALC 40 11/05/2022    Lab Results  Component Value Date   TRIG 146.0 11/05/2022   -Recommend atorvastatin 20 mg every day, fenofibrate 160 mg  -Follow low fat diet and exercise   -Blood pressure goal <140/90 - Microalbumin/creatinine goal <  30 -Last MA/Cr is as follows: Lab Results  Component Value Date   MICROALBUR <0.7 05/25/2022   -on ACE/ARB losartan 50 mg qd -diet changes including salt restriction -limit eating outside -counseled BP targets per standards of diabetes care -uncontrolled blood pressure can lead to retinopathy, nephropathy and cardiovascular and atherosclerotic heart disease  Reviewed and counseled on: -A1C target -Blood sugar targets -Complications of uncontrolled diabetes  -Checking blood sugar before meals and bedtime and bring log next visit -All medications with mechanism of action and side effects -Hypoglycemia management: rule of 15's, Glucagon Emergency Kit and medical alert ID -low-carb low-fat plate-method diet -At least 20 minutes of physical activity per day -Annual dilated retinal eye exam and foot exam -compliance and follow up needs -follow up as scheduled or earlier if problem gets worse  Call if blood sugar is less than 70 or consistently above 250    Take a 15 gm snack of carbohydrate at bedtime before you go to sleep if your blood sugar is less than 100.    If you are going to fast after midnight for a test or procedure, ask your physician for instructions on how to reduce/decrease your insulin dose.    Call if blood sugar is less than 70 or consistently above 250  -Treating a low sugar by rule of 15  (15 gms of sugar every 15 min until sugar is more than 70) If you feel your sugar is low, test your sugar to be sure If your sugar is low (less than 70),  then take 15 grams of a fast acting Carbohydrate (3-4 glucose tablets or glucose gel or 4 ounces of juice or regular soda) Recheck your sugar 15 min after treating low to make sure it is more than 70 If sugar is still less than 70, treat again with 15 grams of carbohydrate          Don't drive the hour of hypoglycemia  If unconscious/unable to eat or drink by mouth, use glucagon injection or nasal spray baqsimi and call 911. Can  repeat again in 15 min if still unconscious.  Return in about 4 weeks (around 04/28/2023).   I have reviewed current medications, nurse's notes, allergies, vital signs, past medical and surgical history, family medical history, and social history for this encounter. Counseled patient on symptoms, examination findings, lab findings, imaging results, treatment decisions and monitoring and prognosis. The patient understood the recommendations and agrees with the treatment plan. All questions regarding treatment plan were fully answered.  Altamese Reeseville, MD  03/31/23    History of Present Illness Douglas Reeves is a 62 y.o. year old male who presents for evaluation of Type II diabetes mellitus.  Douglas Reeves was first diagnosed in 2003.   Diabetes education +  Home diabetes regimen: Jardiance 25 mg daily, Amaryl 4 mg daily, Mounjaro 10 mg weekly, metformin ER 2 g daily  Previous history:  He has been on varied and multiple non-insulin diabetes medications including Trulicity Reportedly Trulicity was stopped because of difficulty with availability Insulin was not used previously   COMPLICATIONS -  MI/Stroke -  retinopathy +  neuropathy -  nephropathy  BLOOD SUGAR DATA Did not bring meter   Physical Exam  BP 136/60   Pulse 86   Ht 6\' 2"  (1.88 m)   Wt 266 lb 9.6 oz (120.9 kg)   SpO2 96%   BMI 34.23 kg/m    Constitutional: well developed, well nourished Head: normocephalic, atraumatic Eyes: sclera anicteric, no redness Neck: supple Lungs: normal respiratory effort Neurology: alert and oriented Skin: dry, no appreciable rashes Musculoskeletal: no appreciable defects Psychiatric: normal mood and affect Diabetic Foot Exam - Simple   No data filed      Current Medications Patient's Medications  New Prescriptions   CONTINUOUS GLUCOSE SENSOR (DEXCOM G7 SENSOR) MISC    1 Device by Does not apply route continuous.   TIRZEPATIDE (MOUNJARO) 12.5 MG/0.5ML PEN    Inject  12.5 mg into the skin once a week.  Previous Medications   ATORVASTATIN (LIPITOR) 20 MG TABLET    TAKE 1 TABLET BY MOUTH AT BEDTIME   COLESEVELAM (WELCHOL) 625 MG TABLET    Take 1 tablet (625 mg total) by mouth daily.   EMPAGLIFLOZIN (JARDIANCE) 25 MG TABS TABLET    Take 1 tablet (25 mg total) by mouth daily.   FENOFIBRATE 160 MG TABLET    TAKE 1 TABLET BY MOUTH ONCE DAILY   GLIMEPIRIDE (AMARYL) 4 MG TABLET    Take 1 tablet by mouth once daily with breakfast   LOSARTAN (COZAAR) 50 MG TABLET    Take 1 tablet by mouth once daily   MECLIZINE (ANTIVERT) 25 MG TABLET    Take 1 tablet (25 mg total) by mouth 3 (three) times daily as needed for dizziness.   METFORMIN (GLUCOPHAGE-XR) 500 MG 24 HR TABLET    TAKE 4 TABLETS BY MOUTH ONCE DAILY. APPOINTMENT REQUIRED FOR FUTURE REFILLS.  Modified Medications   No medications on file  Discontinued Medications   TIRZEPATIDE (  MOUNJARO) 10 MG/0.5ML PEN    INJECT 10 MG  SUBCUTANEOUSLY ONCE A WEEK    Allergies No Known Allergies  Past Medical History Past Medical History:  Diagnosis Date   Diabetes mellitus    Hyperlipidemia    SBO (small bowel obstruction) (HCC) 03/30/2018    Past Surgical History Past Surgical History:  Procedure Laterality Date   APPENDECTOMY  1972   BACK SURGERY  1973   VASECTOMY  2000    Family History family history includes Diabetes in his brother.  Social History Social History   Socioeconomic History   Marital status: Married    Spouse name: Not on file   Number of children: Not on file   Years of education: Not on file   Highest education level: Not on file  Occupational History   Not on file  Tobacco Use   Smoking status: Never   Smokeless tobacco: Never  Vaping Use   Vaping status: Never Used  Substance and Sexual Activity   Alcohol use: No    Alcohol/week: 0.0 standard drinks of alcohol   Drug use: No   Sexual activity: Not on file  Other Topics Concern   Not on file  Social History Narrative    Not on file   Social Drivers of Health   Financial Resource Strain: Not on file  Food Insecurity: Patient Declined (03/05/2023)   Hunger Vital Sign    Worried About Running Out of Food in the Last Year: Patient declined    Ran Out of Food in the Last Year: Patient declined  Transportation Needs: Patient Declined (03/05/2023)   PRAPARE - Administrator, Civil Service (Medical): Patient declined    Lack of Transportation (Non-Medical): Patient declined  Physical Activity: Not on file  Stress: Not on file  Social Connections: Unknown (02/06/2023)   Received from [facility]   Social Network    Social Network: Not on file  Intimate Partner Violence: Patient Declined (03/05/2023)   Humiliation, Afraid, Rape, and Kick questionnaire    Fear of Current or Ex-Partner: Patient declined    Emotionally Abused: Patient declined    Physically Abused: Patient declined    Sexually Abused: Patient declined    Lab Results  Component Value Date   HGBA1C 7.7 (A) 03/31/2023   HGBA1C 8.6 (H) 11/05/2022   HGBA1C 9.4 (A) 10/01/2022   Lab Results  Component Value Date   CHOL 115 11/05/2022   Lab Results  Component Value Date   HDL 45.00 11/05/2022   Lab Results  Component Value Date   LDLCALC 40 11/05/2022   Lab Results  Component Value Date   TRIG 146.0 11/05/2022   Lab Results  Component Value Date   CHOLHDL 3 11/05/2022   Lab Results  Component Value Date   CREATININE 0.91 11/05/2022   Lab Results  Component Value Date   GFR 90.98 11/05/2022   Lab Results  Component Value Date   MICROALBUR <0.7 05/25/2022      Component Value Date/Time   NA 136 11/05/2022 1409   K 4.2 11/05/2022 1409   CL 101 11/05/2022 1409   CO2 27 11/05/2022 1409   GLUCOSE 132 (H) 11/05/2022 1409   GLUCOSE 177 (H) 03/08/2006 0807   BUN 13 11/05/2022 1409   CREATININE 0.91 11/05/2022 1409   CREATININE 0.99 10/04/2015 1717   CALCIUM 9.4 11/05/2022 1409   PROT 6.8 11/05/2022 1409    ALBUMIN 4.4 11/05/2022 1409   AST 20 11/05/2022 1409  ALT 25 11/05/2022 1409   ALKPHOS 82 11/05/2022 1409   BILITOT 0.6 11/05/2022 1409   GFRNONAA >60 05/26/2021 0236   GFRAA >60 04/01/2018 0442      Latest Ref Rng & Units 11/05/2022    2:09 PM 05/25/2022    3:16 PM 05/07/2022    1:23 PM  BMP  Glucose 70 - 99 mg/dL 643  63  84   BUN 6 - 23 mg/dL 13  14  27    Creatinine 0.40 - 1.50 mg/dL 3.29  5.18  8.41   Sodium 135 - 145 mEq/L 136  136  138   Potassium 3.5 - 5.1 mEq/L 4.2  3.9  4.4   Chloride 96 - 112 mEq/L 101  101  100   CO2 19 - 32 mEq/L 27  27  27    Calcium 8.4 - 10.5 mg/dL 9.4  9.2  9.5        Component Value Date/Time   WBC 5.8 11/05/2022 1409   RBC 4.92 11/05/2022 1409   HGB 14.0 11/05/2022 1409   HCT 44.1 11/05/2022 1409   PLT 313.0 11/05/2022 1409   MCV 89.7 11/05/2022 1409   MCH 29.8 05/26/2021 0236   MCHC 31.8 11/05/2022 1409   RDW 14.6 11/05/2022 1409   LYMPHSABS 1.9 11/05/2022 1409   MONOABS 0.6 11/05/2022 1409   EOSABS 0.3 11/05/2022 1409   BASOSABS 0.0 11/05/2022 1409     Parts of this note may have been dictated using voice recognition software. There may be variances in spelling and vocabulary which are unintentional. Not all errors are proofread. Please notify the Thereasa Parkin if any discrepancies are noted or if the meaning of any statement is not clear.

## 2023-03-31 NOTE — Patient Instructions (Signed)

## 2023-04-04 ENCOUNTER — Other Ambulatory Visit: Payer: Self-pay | Admitting: Nurse Practitioner

## 2023-04-04 DIAGNOSIS — E1165 Type 2 diabetes mellitus with hyperglycemia: Secondary | ICD-10-CM

## 2023-04-05 ENCOUNTER — Ambulatory Visit: Payer: 59 | Admitting: "Endocrinology

## 2023-04-05 NOTE — Telephone Encounter (Signed)
Requesting: Glimepiride 4 MG Oral Tablet  Last Visit: 11/05/2022 Next Visit: 06/16/2023 Last Refill: 03/05/2023  Please Advise

## 2023-04-15 ENCOUNTER — Ambulatory Visit: Payer: 59 | Admitting: Podiatry

## 2023-04-15 ENCOUNTER — Encounter: Payer: Self-pay | Admitting: *Deleted

## 2023-04-15 NOTE — Progress Notes (Signed)
 Pt attended 03/05/23 screening event where his b/p was 142/86 (on retake) and her blood sugar was 96. At the event, the pt confirmed her PCP is Tinnie Harada NP at [facility], that he does have insuracnce, does not smoke, and did not note any SDOH insecurities. Chart review reveals pt last saw his PCP in person on 11/05/22, where his b/p was 138/62 and his A1C was 8.6. Pt also sees his endocrinologist on an ongoing basis; chart review indicates pt last saw Dr. Dartha at [facility] on 03/31/23, where his b/p was 136/60 and his A1C was 7.7. Pt has future appt with podiatry 04/15/23, endocrinology 04/28/23 and his PCP on 06/16/23. No additional health equity team support indicated at this time.

## 2023-04-28 ENCOUNTER — Encounter: Payer: Self-pay | Admitting: "Endocrinology

## 2023-04-28 ENCOUNTER — Ambulatory Visit: Payer: 59 | Admitting: "Endocrinology

## 2023-04-28 VITALS — BP 130/70 | HR 104 | Ht 74.0 in | Wt 261.4 lb

## 2023-04-28 DIAGNOSIS — E1140 Type 2 diabetes mellitus with diabetic neuropathy, unspecified: Secondary | ICD-10-CM

## 2023-04-28 DIAGNOSIS — Z7985 Long-term (current) use of injectable non-insulin antidiabetic drugs: Secondary | ICD-10-CM

## 2023-04-28 DIAGNOSIS — E782 Mixed hyperlipidemia: Secondary | ICD-10-CM

## 2023-04-28 DIAGNOSIS — Z7984 Long term (current) use of oral hypoglycemic drugs: Secondary | ICD-10-CM | POA: Diagnosis not present

## 2023-04-28 NOTE — Patient Instructions (Signed)

## 2023-04-28 NOTE — Progress Notes (Signed)
 Outpatient Endocrinology Note Douglas Newcomer, MD  04/28/23   [place] [birth year] [id number]  Referring Provider: Odette Benjamin, NP Primary Care Provider: Odette Benjamin, NP Reason for consultation: Subjective   Assessment & Plan  Diagnoses and all orders for this visit:  Type 2 diabetes mellitus with diabetic neuropathy, without long-term current use of insulin  (HCC)  Long term (current) use of oral hypoglycemic drugs  Long-term (current) use of injectable non-insulin  antidiabetic drugs  Mixed hypercholesterolemia and hypertriglyceridemia   Diabetes Type II complicated by neuropathy  Lab Results  Component Value Date   GFR 90.98 11/05/2022   Hba1c goal less than 7, current Hba1c is 7.7 on 04/10/23   Lab Results  Component Value Date   HGBA1C 7.7 (A) 03/31/2023   Will recommend the following: Jardiance  25 mg daily, Mounjaro  10 mg weekly (has two more months)->12.5 mg/wk, metformin  ER 2 g daily Stop Amaryl  4 mg daily if BG <70  Ordered DM education previously, did not happen   Seen podiatry foot exam on 10/13/2022 by Clemetine Cypher, DPM  Previous history:  He has been on varied and multiple non-insulin  diabetes medications including Trulicity  Reportedly Trulicity  was stopped because of difficulty with availability Insulin  was not used previously   No known contraindications to any of above medications  -Last LD and Tg are as follows: Lab Results  Component Value Date   LDLCALC 40 11/05/2022    Lab Results  Component Value Date   TRIG 146.0 11/05/2022   -Recommend atorvastatin  20 mg every day, fenofibrate  160 mg  -Follow low fat diet and exercise   -Blood pressure goal <140/90 - Microalbumin/creatinine goal < 30 -Last MA/Cr is as follows: Lab Results  Component Value Date   MICROALBUR <0.7 05/25/2022   -on ACE/ARB losartan  50 mg qd -diet changes including salt restriction -limit eating outside -counseled BP targets per standards of  diabetes care -uncontrolled blood pressure can lead to retinopathy, nephropathy and cardiovascular and atherosclerotic heart disease  Reviewed and counseled on: -A1C target -Blood sugar targets -Complications of uncontrolled diabetes  -Checking blood sugar before meals and bedtime and bring log next visit -All medications with mechanism of action and side effects -Hypoglycemia management: rule of 15's, Glucagon Emergency Kit and medical alert ID -low-carb low-fat plate-method diet -At least 20 minutes of physical activity per day -Annual dilated retinal eye exam and foot exam -compliance and follow up needs -follow up as scheduled or earlier if problem gets worse  Call if blood sugar is less than 70 or consistently above 250    Take a 15 gm snack of carbohydrate at bedtime before you go to sleep if your blood sugar is less than 100.    If you are going to fast after midnight for a test or procedure, ask your physician for instructions on how to reduce/decrease your insulin  dose.    Call if blood sugar is less than 70 or consistently above 250  -Treating a low sugar by rule of 15  (15 gms of sugar every 15 min until sugar is more than 70) If you feel your sugar is low, test your sugar to be sure If your sugar is low (less than 70), then take 15 grams of a fast acting Carbohydrate (3-4 glucose tablets or glucose gel or 4 ounces of juice or regular soda) Recheck your sugar 15 min after treating low to make sure it is more than 70 If sugar is still less than 70, treat again  with 15 grams of carbohydrate          Don't drive the hour of hypoglycemia  If unconscious/unable to eat or drink by mouth, use glucagon injection or nasal spray baqsimi and call 911. Can repeat again in 15 min if still unconscious.  Return in about 10 weeks (around 07/07/2023).   I have reviewed current medications, nurse's notes, allergies, vital signs, past medical and surgical history, family medical history,  and social history for this encounter. Counseled patient on symptoms, examination findings, lab findings, imaging results, treatment decisions and monitoring and prognosis. The patient understood the recommendations and agrees with the treatment plan. All questions regarding treatment plan were fully answered.  Douglas Newcomer, MD  04/28/23   History of Present Illness Douglas Reeves is a 63 y.o. year old male who presents for evaluation of Type II diabetes mellitus.  Douglas Reeves was first diagnosed in 2003.   Diabetes education +  Home diabetes regimen: Jardiance  25 mg daily, Amaryl  4 mg daily, Mounjaro  10 mg weekly, metformin  ER 2 g daily  Previous history: He has been on varied and multiple non-insulin  diabetes medications including Trulicity  Reportedly Trulicity  was stopped because of difficulty with availability Insulin  was not used previously   COMPLICATIONS -  MI/Stroke -  retinopathy +  neuropathy -  nephropathy  BLOOD SUGAR DATA  CGM interpretation: At today's visit, we reviewed her CGM downloads. The full report is scanned in the media. Reviewing the CGM trends, BG are well controlled across the day.   Physical Exam  BP 130/70   Pulse (!) 104   Ht 6\' 2"  (1.88 m)   Wt 261 lb 6.4 oz (118.6 kg)   SpO2 97%   BMI 33.56 kg/m    Constitutional: well developed, well nourished Head: normocephalic, atraumatic Eyes: sclera anicteric, no redness Neck: supple Lungs: normal respiratory effort Neurology: alert and oriented Skin: dry, no appreciable rashes Musculoskeletal: no appreciable defects Psychiatric: normal mood and affect Diabetic Foot Exam - Simple   No data filed      Current Medications Patient's Medications  New Prescriptions   No medications on file  Previous Medications   ATORVASTATIN  (LIPITOR) 20 MG TABLET    TAKE 1 TABLET BY MOUTH AT BEDTIME   COLESEVELAM  (WELCHOL ) 625 MG TABLET    Take 1 tablet (625 mg total) by mouth daily.   CONTINUOUS  GLUCOSE SENSOR (DEXCOM G7 SENSOR) MISC    1 Device by Does not apply route continuous.   EMPAGLIFLOZIN  (JARDIANCE ) 25 MG TABS TABLET    Take 1 tablet (25 mg total) by mouth daily.   FENOFIBRATE  160 MG TABLET    TAKE 1 TABLET BY MOUTH ONCE DAILY   GLIMEPIRIDE  (AMARYL ) 4 MG TABLET    Take 1 tablet by mouth once daily with breakfast   LOSARTAN  (COZAAR ) 50 MG TABLET    Take 1 tablet by mouth once daily   MECLIZINE  (ANTIVERT ) 25 MG TABLET    Take 1 tablet (25 mg total) by mouth 3 (three) times daily as needed for dizziness.   METFORMIN  (GLUCOPHAGE -XR) 500 MG 24 HR TABLET    TAKE 4 TABLETS BY MOUTH ONCE DAILY. APPOINTMENT REQUIRED FOR FUTURE REFILLS.   TIRZEPATIDE  (MOUNJARO ) 12.5 MG/0.5ML PEN    Inject 12.5 mg into the skin once a week.  Modified Medications   No medications on file  Discontinued Medications   No medications on file    Allergies No Known Allergies  Past Medical History Past Medical History:  Diagnosis Date   Diabetes mellitus    Hyperlipidemia    SBO (small bowel obstruction) (HCC) 03/30/2018    Past Surgical History Past Surgical History:  Procedure Laterality Date   APPENDECTOMY  1972   BACK SURGERY  1973   VASECTOMY  2000    Family History family history includes Diabetes in his brother.  Social History Social History   Socioeconomic History   Marital status: Married    Spouse name: Not on file   Number of children: Not on file   Years of education: Not on file   Highest education level: Not on file  Occupational History   Not on file  Tobacco Use   Smoking status: Never   Smokeless tobacco: Never  Vaping Use   Vaping status: Never Used  Substance and Sexual Activity   Alcohol use: No    Alcohol/week: 0.0 standard drinks of alcohol   Drug use: No   Sexual activity: Not on file  Other Topics Concern   Not on file  Social History Narrative   Not on file   Social Drivers of Health   Financial Resource Strain: Not on file  Food Insecurity:  Patient Declined (03/05/2023)   Hunger Vital Sign    Worried About Running Out of Food in the Last Year: Patient declined    Ran Out of Food in the Last Year: Patient declined  Transportation Needs: Patient Declined (03/05/2023)   PRAPARE - Administrator, Civil Service (Medical): Patient declined    Lack of Transportation (Non-Medical): Patient declined  Physical Activity: Not on file  Stress: Not on file  Social Connections: Unknown (02/06/2023)   [facility]   Social Network    Social Network: Not on file  Intimate Partner Violence: Patient Declined (03/05/2023)   Humiliation, Afraid, Rape, and Kick questionnaire    Fear of Current or Ex-Partner: Patient declined    Emotionally Abused: Patient declined    Physically Abused: Patient declined    Sexually Abused: Patient declined    Lab Results  Component Value Date   HGBA1C 7.7 (A) 03/31/2023   HGBA1C 8.6 (H) 11/05/2022   HGBA1C 9.4 (A) 10/01/2022   Lab Results  Component Value Date   CHOL 115 11/05/2022   Lab Results  Component Value Date   HDL 45.00 11/05/2022   Lab Results  Component Value Date   LDLCALC 40 11/05/2022   Lab Results  Component Value Date   TRIG 146.0 11/05/2022   Lab Results  Component Value Date   CHOLHDL 3 11/05/2022   Lab Results  Component Value Date   CREATININE 0.91 11/05/2022   Lab Results  Component Value Date   GFR 90.98 11/05/2022   Lab Results  Component Value Date   MICROALBUR <0.7 05/25/2022      Component Value Date/Time   NA 136 11/05/2022 1409   K 4.2 11/05/2022 1409   CL 101 11/05/2022 1409   CO2 27 11/05/2022 1409   GLUCOSE 132 (H) 11/05/2022 1409   GLUCOSE 177 (H) 03/08/2006 0807   BUN 13 11/05/2022 1409   CREATININE 0.91 11/05/2022 1409   CREATININE 0.99 10/04/2015 1717   CALCIUM  9.4 11/05/2022 1409   PROT 6.8 11/05/2022 1409   ALBUMIN 4.4 11/05/2022 1409   AST 20 11/05/2022 1409   ALT 25 11/05/2022 1409   ALKPHOS 82  11/05/2022 1409   BILITOT 0.6 11/05/2022 1409   GFRNONAA >60 05/26/2021 0236   GFRAA >60 04/01/2018 1610  Latest Ref Rng & Units 11/05/2022    2:09 PM 05/25/2022    3:16 PM 05/07/2022    1:23 PM  BMP  Glucose 70 - 99 mg/dL 413  63  84   BUN 6 - 23 mg/dL 13  14  27    Creatinine 0.40 - 1.50 mg/dL 2.44  0.10  2.72   Sodium 135 - 145 mEq/L 136  136  138   Potassium 3.5 - 5.1 mEq/L 4.2  3.9  4.4   Chloride 96 - 112 mEq/L 101  101  100   CO2 19 - 32 mEq/L 27  27  27    Calcium  8.4 - 10.5 mg/dL 9.4  9.2  9.5        Component Value Date/Time   WBC 5.8 11/05/2022 1409   RBC 4.92 11/05/2022 1409   HGB 14.0 11/05/2022 1409   HCT 44.1 11/05/2022 1409   PLT 313.0 11/05/2022 1409   MCV 89.7 11/05/2022 1409   MCH 29.8 05/26/2021 0236   MCHC 31.8 11/05/2022 1409   RDW 14.6 11/05/2022 1409   LYMPHSABS 1.9 11/05/2022 1409   MONOABS 0.6 11/05/2022 1409   EOSABS 0.3 11/05/2022 1409   BASOSABS 0.0 11/05/2022 1409     Parts of this note may have been dictated using voice recognition software. There may be variances in spelling and vocabulary which are unintentional. Not all errors are proofread. Please notify the [facility] if any discrepancies are noted or if the meaning of any statement is not clear.

## 2023-05-05 ENCOUNTER — Telehealth: Payer: Self-pay | Admitting: Nurse Practitioner

## 2023-05-05 NOTE — Telephone Encounter (Unsigned)
Copied from CRM [id number]. Topic: Clinical - Prescription Issue >> May 05, 2023  3:42 PM Irine Seal wrote: Reason for CRM: fenofibrate 160 MG tablet, pt stated that the [facility] informed him that he would need to contact PCP for a refill, he stated that the insurance will only cover a 38 day supply so he is needing help with getting insurance to cover his 90 day supply. Pt call back  [phone]

## 2023-05-06 NOTE — Telephone Encounter (Addendum)
Requesting: Fenofibrate 160mg  Last Visit: 11/05/2022 Next Visit: 06/16/2023 Last Refill: 11/05/2022  Please Advise    I called [facility] to see what we can do on our end and patient needs an 84 day supply for insurance to cover his medication. He is scheduled to follow up on 06/16/2023.

## 2023-05-07 ENCOUNTER — Other Ambulatory Visit: Payer: Self-pay | Admitting: Family

## 2023-05-07 DIAGNOSIS — E1169 Type 2 diabetes mellitus with other specified complication: Secondary | ICD-10-CM

## 2023-05-07 MED ORDER — FENOFIBRATE 160 MG PO TABS: ORAL_TABLET | ORAL | 1 refills | Status: AC

## 2023-05-07 NOTE — Telephone Encounter (Signed)
I called and notified patient that Rx sent to pharmacy.

## 2023-05-08 ENCOUNTER — Other Ambulatory Visit: Payer: Self-pay | Admitting: Endocrinology

## 2023-05-08 DIAGNOSIS — E1165 Type 2 diabetes mellitus with hyperglycemia: Secondary | ICD-10-CM

## 2023-05-11 ENCOUNTER — Encounter: Payer: 59 | Admitting: Nurse Practitioner

## 2023-05-20 ENCOUNTER — Other Ambulatory Visit: Payer: Self-pay | Admitting: "Endocrinology

## 2023-06-05 ENCOUNTER — Other Ambulatory Visit: Payer: Self-pay | Admitting: "Endocrinology

## 2023-06-05 DIAGNOSIS — E1165 Type 2 diabetes mellitus with hyperglycemia: Secondary | ICD-10-CM

## 2023-06-07 NOTE — Telephone Encounter (Signed)
 Medication refill request complete

## 2023-06-16 ENCOUNTER — Encounter: Payer: 59 | Admitting: Nurse Practitioner

## 2023-06-18 ENCOUNTER — Other Ambulatory Visit: Payer: Self-pay | Admitting: "Endocrinology

## 2023-06-23 ENCOUNTER — Other Ambulatory Visit: Payer: Self-pay | Admitting: Nurse Practitioner

## 2023-06-27 ENCOUNTER — Other Ambulatory Visit: Payer: Self-pay | Admitting: Nurse Practitioner

## 2023-06-28 ENCOUNTER — Other Ambulatory Visit (HOSPITAL_COMMUNITY): Payer: Self-pay

## 2023-06-28 ENCOUNTER — Telehealth: Payer: Self-pay | Admitting: Pharmacist

## 2023-06-28 NOTE — Telephone Encounter (Signed)
 Pharmacy Patient Advocate Encounter   Received notification from Patient Pharmacy that prior authorization for Dexcom G7 Sensor is required/requested.   Insurance verification completed.   The patient is insured through [facility] .   Per test claim: PA required; PA submitted to above mentioned insurance via CoverMyMeds Key/confirmation #/EOC [id number] Status is pending

## 2023-06-28 NOTE — Telephone Encounter (Signed)
 Requesting: Atorvastatin Calcium 20 MG Oral Tablet  Last Visit: 11/05/2022 Next Visit: 07/01/2023 Last Refill: 01/01/2023  Please Advise

## 2023-06-29 NOTE — Telephone Encounter (Signed)
 Pharmacy Patient Advocate Encounter  Received notification from [facility] that Prior Authorization for Dexcom G7 sensor has been DENIED.  See denial reason below. No denial letter attached in CMM. Will attach denial letter to Media tab once received.    PA #/Case ID/Reference #: [id number]

## 2023-07-01 ENCOUNTER — Encounter: Payer: Self-pay | Admitting: Nurse Practitioner

## 2023-07-01 ENCOUNTER — Ambulatory Visit (INDEPENDENT_AMBULATORY_CARE_PROVIDER_SITE_OTHER): Admitting: Nurse Practitioner

## 2023-07-01 VITALS — BP 126/62 | HR 66 | Temp 97.3°F | Ht 74.0 in | Wt 260.2 lb

## 2023-07-01 DIAGNOSIS — Z125 Encounter for screening for malignant neoplasm of prostate: Secondary | ICD-10-CM

## 2023-07-01 DIAGNOSIS — I152 Hypertension secondary to endocrine disorders: Secondary | ICD-10-CM

## 2023-07-01 DIAGNOSIS — Z23 Encounter for immunization: Secondary | ICD-10-CM | POA: Diagnosis not present

## 2023-07-01 DIAGNOSIS — Z7984 Long term (current) use of oral hypoglycemic drugs: Secondary | ICD-10-CM

## 2023-07-01 DIAGNOSIS — E1169 Type 2 diabetes mellitus with other specified complication: Secondary | ICD-10-CM | POA: Diagnosis not present

## 2023-07-01 DIAGNOSIS — E669 Obesity, unspecified: Secondary | ICD-10-CM

## 2023-07-01 DIAGNOSIS — Z7985 Long-term (current) use of injectable non-insulin antidiabetic drugs: Secondary | ICD-10-CM

## 2023-07-01 DIAGNOSIS — E785 Hyperlipidemia, unspecified: Secondary | ICD-10-CM

## 2023-07-01 DIAGNOSIS — Z0000 Encounter for general adult medical examination without abnormal findings: Secondary | ICD-10-CM

## 2023-07-01 DIAGNOSIS — E1159 Type 2 diabetes mellitus with other circulatory complications: Secondary | ICD-10-CM | POA: Diagnosis not present

## 2023-07-01 HISTORY — DX: Encounter for general adult medical examination without abnormal findings: Z00.00

## 2023-07-01 LAB — COMPREHENSIVE METABOLIC PANEL
ALT: 19 U/L (ref 0–53)
AST: 18 U/L (ref 0–37)
Albumin: 4.5 g/dL (ref 3.5–5.2)
Alkaline Phosphatase: 50 U/L (ref 39–117)
BUN: 19 mg/dL (ref 6–23)
CO2: 29 meq/L (ref 19–32)
Calcium: 9.4 mg/dL (ref 8.4–10.5)
Chloride: 101 meq/L (ref 96–112)
Creatinine, Ser: 1.13 mg/dL (ref 0.40–1.50)
GFR: 69.85 mL/min (ref >60.00)
Glucose, Bld: 111 mg/dL — ABNORMAL HIGH (ref 70–99)
Potassium: 4 meq/L (ref 3.5–5.1)
Sodium: 137 meq/L (ref 135–145)
Total Bilirubin: 0.7 mg/dL (ref 0.2–1.2)
Total Protein: 7.1 g/dL (ref 6.0–8.3)

## 2023-07-01 LAB — HEMOGLOBIN A1C: Hgb A1c MFr Bld: 7.3 % — ABNORMAL HIGH (ref 4.6–6.5)

## 2023-07-01 LAB — LIPID PANEL
Cholesterol: 113 mg/dL (ref 0–200)
HDL: 51 mg/dL (ref >39.00)
LDL Cholesterol: 50 mg/dL (ref 0–99)
NonHDL: 62.1
Total CHOL/HDL Ratio: 2
Triglycerides: 61 mg/dL (ref 0.0–149.0)
VLDL: 12.2 mg/dL (ref 0.0–40.0)

## 2023-07-01 LAB — CBC WITH DIFFERENTIAL/PLATELET
Basophils Absolute: 0 10*3/uL (ref 0.0–0.1)
Basophils Relative: 0.8 % (ref 0.0–3.0)
Eosinophils Absolute: 0.1 10*3/uL (ref 0.0–0.7)
Eosinophils Relative: 2.4 % (ref 0.0–5.0)
HCT: 43.9 % (ref 39.0–52.0)
Hemoglobin: 14.5 g/dL (ref 13.0–17.0)
Lymphocytes Relative: 39.7 % (ref 12.0–46.0)
Lymphs Abs: 1.8 10*3/uL (ref 0.7–4.0)
MCHC: 33.1 g/dL (ref 30.0–36.0)
MCV: 89.1 fl (ref 78.0–100.0)
Monocytes Absolute: 0.5 10*3/uL (ref 0.1–1.0)
Monocytes Relative: 10.8 % (ref 3.0–12.0)
Neutro Abs: 2.2 10*3/uL (ref 1.4–7.7)
Neutrophils Relative %: 46.3 % (ref 43.0–77.0)
Platelets: 294 10*3/uL (ref 150.0–400.0)
RBC: 4.92 Mil/uL (ref 4.22–5.81)
RDW: 14.9 % (ref 11.5–15.5)
WBC: 4.7 10*3/uL (ref 4.0–10.5)

## 2023-07-01 LAB — MICROALBUMIN / CREATININE URINE RATIO
Creatinine,U: 141.8 mg/dL
Microalb Creat Ratio: UNDETERMINED mg/g (ref 0.0–30.0)
Microalb, Ur: <0.7 mg/dL

## 2023-07-01 LAB — PSA: PSA: 2.51 ng/mL (ref 0.10–4.00)

## 2023-07-01 NOTE — Patient Instructions (Signed)
It was great to see you!  We are checking your labs today and will let you know the results via mychart/phone.   Keep up the great work!  Let's follow-up in 6 months, sooner if you have concerns.  If a referral was placed today, you will be contacted for an appointment. Please note that routine referrals can sometimes take up to 3-4 weeks to process. Please call our office if you haven't heard anything after this time frame.  Take care,  Mardella Nuckles, NP  

## 2023-07-01 NOTE — Assessment & Plan Note (Signed)
Continue glimepiride 4 mg daily, Jardiance 25 mg daily, and metformin XR 2000 mg daily

## 2023-07-01 NOTE — Assessment & Plan Note (Signed)
Health maintenance reviewed and updated. Discussed nutrition, exercise. Follow-up 1 year.

## 2023-07-01 NOTE — Assessment & Plan Note (Signed)
 Chronic, stable. Continue losartan 50 mg daily.  Check CMP, CBC.  Follow-up in 6 months.

## 2023-07-01 NOTE — Progress Notes (Signed)
 BP 126/62 (BP Location: Right Arm, Patient Position: Sitting, Cuff Size: Normal)   Pulse 66   Temp (!) 97.3 F (36.3 C)   Ht 6\' 2"  (1.88 m)   Wt 260 lb 3.2 oz (118 kg)   SpO2 99%   BMI 33.41 kg/m    Subjective:    Patient ID: Douglas Reeves Stable, male    DOB: 1960/11/08, 63 y.o.   MRN: [id number]  CC: Chief Complaint  Patient presents with   Annual Exam    With fasting lab work, no concerns    HPI: Douglas Reeves is a 63 y.o. male presenting on 07/01/2023 for comprehensive medical examination. Current medical complaints include:none  He currently lives with: wife  Depression and Anxiety Screen done today and results listed below:     07/01/2023    9:12 AM 11/05/2022    1:52 PM 05/07/2022    1:24 PM 05/28/2021   11:02 AM 03/03/2019    9:38 AM  Depression screen PHQ 2/9  Decreased Interest 0 0 0 0 0  Down, Depressed, Hopeless 0 0 0 0 0  PHQ - 2 Score 0 0 0 0 0  Altered sleeping 1      Tired, decreased energy 1      Change in appetite 0      Feeling bad or failure about yourself  0      Trouble concentrating 0      Moving slowly or fidgety/restless 0      Suicidal thoughts 0      PHQ-9 Score 2      Difficult doing work/chores Not difficult at all          07/01/2023    9:13 AM  GAD 7 : Generalized Anxiety Score  Nervous, Anxious, on Edge 0  Control/stop worrying 0  Worry too much - different things 0  Trouble relaxing 0  Restless 0  Easily annoyed or irritable 0  Afraid - awful might happen 0  Total GAD 7 Score 0  Anxiety Difficulty Not difficult at all    The patient does not have a history of falls. I did not complete a risk assessment for falls. A plan of care for falls was not documented.   Past Medical History:  Past Medical History:  Diagnosis Date   Diabetes mellitus    Hyperlipidemia    Routine general medical examination at a health care facility 07/01/2023   SBO (small bowel obstruction) (HCC) 03/30/2018    Surgical History:  Past Surgical  History:  Procedure Laterality Date   APPENDECTOMY  1972   BACK SURGERY  1973   VASECTOMY  2000    Medications:  Current Outpatient Medications on File Prior to Visit  Medication Sig   atorvastatin (LIPITOR) 20 MG tablet TAKE 1 TABLET BY MOUTH AT BEDTIME   colesevelam (WELCHOL) 625 MG tablet Take 1 tablet (625 mg total) by mouth daily.   Continuous Glucose Sensor (DEXCOM G7 SENSOR) MISC 1 Device by Does not apply route continuous.   empagliflozin (JARDIANCE) 25 MG TABS tablet Take 1 tablet by mouth once daily   fenofibrate 160 MG tablet TAKE 1 TABLET BY MOUTH ONCE DAILY   glimepiride (AMARYL) 4 MG tablet Take 1 tablet by mouth once daily with breakfast   losartan (COZAAR) 50 MG tablet Take 1 tablet by mouth once daily   metFORMIN (GLUCOPHAGE-XR) 500 MG 24 hr tablet TAKE 4 TABLETS BY MOUTH ONCE DAILY. APPOINTMENT REQUIRED FOR FUTURE REFILLS.   tirzepatide (  MOUNJARO) 12.5 MG/0.5ML Pen Inject 12.5 mg into the skin once a week.   meclizine (ANTIVERT) 25 MG tablet Take 1 tablet (25 mg total) by mouth 3 (three) times daily as needed for dizziness. (Patient not taking: Reported on 07/01/2023)   No current facility-administered medications on file prior to visit.    Allergies:  No Known Allergies  Social History:  Social History   Socioeconomic History   Marital status: Married    Spouse name: Not on file   Number of children: Not on file   Years of education: Not on file   Highest education level: Not on file  Occupational History   Not on file  Tobacco Use   Smoking status: Never   Smokeless tobacco: Never  Vaping Use   Vaping status: Never Used  Substance and Sexual Activity   Alcohol use: No    Alcohol/week: 0.0 standard drinks of alcohol   Drug use: No   Sexual activity: Not on file  Other Topics Concern   Not on file  Social History Narrative   Not on file   Social Drivers of Health   Financial Resource Strain: Not on file  Food Insecurity: Patient Declined  (03/05/2023)   Hunger Vital Sign    Worried About Running Out of Food in the Last Year: Patient declined    Ran Out of Food in the Last Year: Patient declined  Transportation Needs: Patient Declined (03/05/2023)   PRAPARE - Administrator, Civil Service (Medical): Patient declined    Lack of Transportation (Non-Medical): Patient declined  Physical Activity: Not on file  Stress: Not on file  Social Connections: Unknown (02/06/2023)   Received from [facility]   Social Network    Social Network: Not on file  Intimate Partner Violence: Patient Declined (03/05/2023)   Humiliation, Afraid, Rape, and Kick questionnaire    Fear of Current or Ex-Partner: Patient declined    Emotionally Abused: Patient declined    Physically Abused: Patient declined    Sexually Abused: Patient declined   Social History   Tobacco Use  Smoking Status Never  Smokeless Tobacco Never   Social History   Substance and Sexual Activity  Alcohol Use No   Alcohol/week: 0.0 standard drinks of alcohol    Family History:  Family History  Problem Relation Age of Onset   Diabetes Brother    Colon cancer Neg Hx     Past medical history, surgical history, medications, allergies, family history and social history reviewed with patient today and changes made to appropriate areas of the chart.   Review of Systems  Constitutional: Negative.   HENT: Negative.    Eyes: Negative.   Respiratory: Negative.    Cardiovascular: Negative.   Gastrointestinal: Negative.   Genitourinary: Negative.   Musculoskeletal: Negative.   Skin: Negative.   Neurological: Negative.   Psychiatric/Behavioral: Negative.     All other ROS negative except what is listed above and in the HPI.      Objective:    BP 126/62 (BP Location: Right Arm, Patient Position: Sitting, Cuff Size: Normal)   Pulse 66   Temp (!) 97.3 F (36.3 C)   Ht 6\' 2"  (1.88 m)   Wt 260 lb 3.2 oz (118 kg)   SpO2 99%   BMI 33.41 kg/m   Wt  Readings from Last 3 Encounters:  07/01/23 260 lb 3.2 oz (118 kg)  04/28/23 261 lb 6.4 oz (118.6 kg)  03/31/23 266 lb 9.6 oz (120.9 kg)  Physical Exam Vitals and nursing note reviewed.  Constitutional:      General: He is not in acute distress.    Appearance: Normal appearance.  HENT:     Head: Normocephalic and atraumatic.     Right Ear: Tympanic membrane, ear canal and external ear normal.     Left Ear: Tympanic membrane, ear canal and external ear normal.     Mouth/Throat:     Mouth: Mucous membranes are moist.     Pharynx: No posterior oropharyngeal erythema.  Eyes:     Conjunctiva/sclera: Conjunctivae normal.  Cardiovascular:     Rate and Rhythm: Normal rate and regular rhythm.     Pulses: Normal pulses.     Heart sounds: Normal heart sounds.  Pulmonary:     Effort: Pulmonary effort is normal.     Breath sounds: Normal breath sounds.  Abdominal:     Palpations: Abdomen is soft.     Tenderness: There is no abdominal tenderness.  Musculoskeletal:        General: Normal range of motion.     Cervical back: Normal range of motion and neck supple. No tenderness.     Right lower leg: No edema.     Left lower leg: No edema.  Lymphadenopathy:     Cervical: No cervical adenopathy.  Skin:    General: Skin is warm and dry.  Neurological:     General: No focal deficit present.     Mental Status: He is alert and oriented to person, place, and time.     Cranial Nerves: No cranial nerve deficit.     Coordination: Coordination normal.     Gait: Gait normal.  Psychiatric:        Mood and Affect: Mood normal.        Behavior: Behavior normal.        Thought Content: Thought content normal.        Judgment: Judgment normal.     Results for orders placed or performed in visit on 03/31/23  POCT glycosylated hemoglobin (Hb A1C)   Collection Time: 03/31/23  3:05 PM  Result Value Ref Range   Hemoglobin A1C 7.7 (A) 4.0 - 5.6 %   HbA1c POC (<> result, manual entry)     HbA1c,  POC (prediabetic range)     HbA1c, POC (controlled diabetic range)        Assessment & Plan:   Problem List Items Addressed This Visit       Cardiovascular and Mediastinum   Hypertension associated with diabetes (HCC)   Chronic, stable. Continue losartan 50 mg daily.  Check CMP, CBC.  Follow-up in 6 months.      Relevant Orders   CBC with Differential/Platelet   Comprehensive metabolic panel     Endocrine   Hyperlipidemia associated with type 2 diabetes mellitus (HCC)   Chronic, stable.  Continue atorvastatin 20 mg daily, fenofibrate 160mg  daily, and WelChol 625 mg daily.  Check CMP, CBC, lipid panel today.  Follow-up in 6 months.      Relevant Orders   CBC with Differential/Platelet   Comprehensive metabolic panel   Lipid panel   Type 2 diabetes mellitus with obesity (HCC)   Chronic, ongoing. Continue jardiance 25mg  daily, glimerpiride 4mg  daily, metformin XR 2,000mg  daily, and mounjaro 12.5mg  injection weekly. Up to date on eye exam. Continue collaboration and recommendations from endocrinology. Follow-up in 6 months.       Relevant Orders   CBC with Differential/Platelet   Comprehensive metabolic panel  Hemoglobin A1c   Microalbumin / creatinine urine ratio     Other   Obesity (BMI 30-39.9)   BMI 33.4. Discussed nutrition and exercise.       Long-term current use of injectable noninsulin antidiabetic medication   Continue Mounjaro 12.5 mg injection weekly      Long term current use of oral hypoglycemic drug   Continue glimepiride 4 mg daily, Jardiance 25 mg daily, and metformin XR 2000 mg daily      Routine general medical examination at a health care facility - Primary   Health maintenance reviewed and updated. Discussed nutrition, exercise. Follow-up 1 year.        Other Visit Diagnoses       Screening PSA (prostate specific antigen)       Screen PSA today   Relevant Orders   PSA     Immunization due       Prevnar 20 given today   Relevant Orders    Pneumococcal conjugate vaccine 20-valent (Completed)        LABORATORY TESTING:  Health maintenance labs ordered today as discussed above.   The natural history of prostate cancer and ongoing controversy regarding screening and potential treatment outcomes of prostate cancer has been discussed with the patient. The meaning of a false positive PSA and a false negative PSA has been discussed. He indicates understanding of the limitations of this screening test and wishes to proceed with screening PSA testing.   IMMUNIZATIONS:   - Tdap: Tetanus vaccination status reviewed: last tetanus booster within 10 years. - Influenza: Up to date - Pneumovax: Not applicable - Prevnar: Administered today - HPV: Not applicable - Shingrix vaccine: Up to date  SCREENING: - Colonoscopy: Up to date  Discussed with patient purpose of the colonoscopy is to detect colon cancer at curable precancerous or early stages   - AAA Screening: Not applicable   PATIENT COUNSELING:    Sexuality: Discussed sexually transmitted diseases, partner selection, use of condoms, avoidance of unintended pregnancy  and contraceptive alternatives.   Advised to avoid cigarette smoking.  I discussed with the patient that most people either abstain from alcohol or drink within safe limits (<=14/week and <=4 drinks/occasion for males, <=7/weeks and <= 3 drinks/occasion for females) and that the risk for alcohol disorders and other health effects rises proportionally with the number of drinks per week and how often a drinker exceeds daily limits.  Discussed cessation/primary prevention of drug use and availability of treatment for abuse.   Diet: Encouraged to adjust caloric intake to maintain  or achieve ideal body weight, to reduce intake of dietary saturated fat and total fat, to limit sodium intake by avoiding high sodium foods and not adding table salt, and to maintain adequate dietary potassium and calcium preferably from  fresh fruits, vegetables, and low-fat dairy products.    stressed the importance of regular exercise  Injury prevention: Discussed safety belts, safety helmets, smoke detector, smoking near bedding or upholstery.   Dental health: Discussed importance of regular tooth brushing, flossing, and dental visits.   Follow up plan: NEXT PREVENTATIVE PHYSICAL DUE IN 1 YEAR. Return in about 6 months (around 01/01/2024) for Diabetes.  Arnitra Sokoloski A Alfard Cochrane

## 2023-07-01 NOTE — Assessment & Plan Note (Signed)
 BMI 33.4. Discussed nutrition and exercise.

## 2023-07-01 NOTE — Assessment & Plan Note (Signed)
 Continue Mounjaro 12.5 mg injection weekly

## 2023-07-01 NOTE — Assessment & Plan Note (Signed)
Chronic, stable.  Continue atorvastatin 20 mg daily, fenofibrate 160mg  daily, and WelChol 625 mg daily.  Check CMP, CBC, lipid panel today.  Follow-up in 6 months.

## 2023-07-01 NOTE — Assessment & Plan Note (Signed)
 Chronic, ongoing. Continue jardiance 25mg  daily, glimerpiride 4mg  daily, metformin XR 2,000mg  daily, and mounjaro 12.5mg  injection weekly. Up to date on eye exam. Continue collaboration and recommendations from endocrinology. Follow-up in 6 months.

## 2023-07-14 ENCOUNTER — Encounter: Payer: Self-pay | Admitting: "Endocrinology

## 2023-07-14 ENCOUNTER — Ambulatory Visit: Payer: 59 | Admitting: "Endocrinology

## 2023-07-14 VITALS — BP 124/80 | HR 86 | Ht 74.0 in | Wt 259.0 lb

## 2023-07-14 DIAGNOSIS — Z7985 Long-term (current) use of injectable non-insulin antidiabetic drugs: Secondary | ICD-10-CM

## 2023-07-14 DIAGNOSIS — E782 Mixed hyperlipidemia: Secondary | ICD-10-CM

## 2023-07-14 DIAGNOSIS — Z7984 Long term (current) use of oral hypoglycemic drugs: Secondary | ICD-10-CM | POA: Diagnosis not present

## 2023-07-14 DIAGNOSIS — E1140 Type 2 diabetes mellitus with diabetic neuropathy, unspecified: Secondary | ICD-10-CM

## 2023-07-14 MED ORDER — FREESTYLE LIBRE 3 SENSOR MISC: 1.0000 | 3 refills | Status: AC

## 2023-07-14 MED ORDER — TIRZEPATIDE 15 MG/0.5ML ~~LOC~~ SOAJ: 15.0000 mg | SUBCUTANEOUS | 3 refills | Status: DC

## 2023-07-14 NOTE — Progress Notes (Signed)
 Outpatient Endocrinology Note Douglas Indios, MD  07/14/23   Deep Bonawitz 1961-01-11 [id number]  Referring Provider: Gerre Scull, NP Primary Care Provider: Gerre Scull, NP Reason for consultation: Subjective   Assessment & Plan  There are no diagnoses linked to this encounter.  Diabetes Type II complicated by neuropathy  Lab Results  Component Value Date   GFR 69.85 07/01/2023   Hba1c goal less than 7, current Hba1c is 7.7 on 04/10/23   Lab Results  Component Value Date   HGBA1C 7.3 (H) 07/01/2023   Will recommend the following: Jardiance 25 mg daily, Mounjaro 15 mg weekly, metformin ER 2 g daily Stop Amaryl 4 mg daily if BG <70  Ordered DM education previously 07/14/23: Dexcom denied, sent libre  Seen podiatry foot exam on 10/13/2022 by Elinor Parkinson, DPM  Previous history:  He has been on varied and multiple non-insulin diabetes medications including Trulicity Reportedly Trulicity was stopped because of difficulty with availability Insulin was not used previously   No known contraindications to any of above medications  -Last LD and Tg are as follows: Lab Results  Component Value Date   LDLCALC 50 07/01/2023    Lab Results  Component Value Date   TRIG 61.0 07/01/2023   -On atorvastatin 20 mg every day, fenofibrate 160 mg  -Follow low fat diet and exercise   -Blood pressure goal <140/90 - Microalbumin/creatinine goal < 30 -Last MA/Cr is as follows: Lab Results  Component Value Date   MICROALBUR <0.7 07/01/2023   -on ACE/ARB losartan 50 mg qd -diet changes including salt restriction -limit eating outside -counseled BP targets per standards of diabetes care -uncontrolled blood pressure can lead to retinopathy, nephropathy and cardiovascular and atherosclerotic heart disease  Reviewed and counseled on: -A1C target -Blood sugar targets -Complications of uncontrolled diabetes  -Checking blood sugar before meals and bedtime and bring  log next visit -All medications with mechanism of action and side effects -Hypoglycemia management: rule of 15's, Glucagon Emergency Kit and medical alert ID -low-carb low-fat plate-method diet -At least 20 minutes of physical activity per day -Annual dilated retinal eye exam and foot exam -compliance and follow up needs -follow up as scheduled or earlier if problem gets worse  Call if blood sugar is less than 70 or consistently above 250    Take a 15 gm snack of carbohydrate at bedtime before you go to sleep if your blood sugar is less than 100.    If you are going to fast after midnight for a test or procedure, ask your physician for instructions on how to reduce/decrease your insulin dose.    Call if blood sugar is less than 70 or consistently above 250  -Treating a low sugar by rule of 15  (15 gms of sugar every 15 min until sugar is more than 70) If you feel your sugar is low, test your sugar to be sure If your sugar is low (less than 70), then take 15 grams of a fast acting Carbohydrate (3-4 glucose tablets or glucose gel or 4 ounces of juice or regular soda) Recheck your sugar 15 min after treating low to make sure it is more than 70 If sugar is still less than 70, treat again with 15 grams of carbohydrate          Don't drive the hour of hypoglycemia  If unconscious/unable to eat or drink by mouth, use glucagon injection or nasal spray baqsimi and call 911. Can repeat again  in 15 min if still unconscious.  Return in about 3 months (around 10/13/2023).   I have reviewed current medications, nurse's notes, allergies, vital signs, past medical and surgical history, family medical history, and social history for this encounter. Counseled patient on symptoms, examination findings, lab findings, imaging results, treatment decisions and monitoring and prognosis. The patient understood the recommendations and agrees with the treatment plan. All questions regarding treatment plan were fully  answered.  Douglas [place], MD  07/14/23   History of Present Illness Douglas Reeves is a 63 y.o. year old male who presents for evaluation of Type II diabetes mellitus.  [place] was first diagnosed in 2003.   Diabetes education +  Home diabetes regimen: Jardiance 25 mg daily, Amaryl 4 mg daily, Mounjaro 12.5 mg weekly, metformin ER 2 g daily  Previous history: He has been on varied and multiple non-insulin diabetes medications including Trulicity Reportedly Trulicity was stopped because of difficulty with availability Insulin was not used previously   COMPLICATIONS -  MI/Stroke -  retinopathy +  neuropathy -  nephropathy  BLOOD SUGAR DATA  CGM interpretation: At today's visit, we reviewed her CGM downloads. The full report is scanned in the media. Reviewing the CGM trends, BG are well controlled across the day.   Physical Exam  BP 124/80   Pulse 86   Ht 6\' 2"  (1.88 m)   Wt 259 lb (117.5 kg)   SpO2 98%   BMI 33.25 kg/m    Constitutional: well developed, well nourished Head: normocephalic, atraumatic Eyes: sclera anicteric, no redness Neck: supple Lungs: normal respiratory effort Neurology: alert and oriented Skin: dry, no appreciable rashes Musculoskeletal: no appreciable defects Psychiatric: normal mood and affect Diabetic Foot Exam - Simple   No data filed      Current Medications Patient's Medications  New Prescriptions   CONTINUOUS GLUCOSE SENSOR (FREESTYLE LIBRE 3 SENSOR) MISC    1 Device by Does not apply route continuous.   TIRZEPATIDE (MOUNJARO) 15 MG/0.5ML PEN    Inject 15 mg into the skin once a week.  Previous Medications   ATORVASTATIN (LIPITOR) 20 MG TABLET    TAKE 1 TABLET BY MOUTH AT BEDTIME   COLESEVELAM (WELCHOL) 625 MG TABLET    Take 1 tablet (625 mg total) by mouth daily.   EMPAGLIFLOZIN (JARDIANCE) 25 MG TABS TABLET    Take 1 tablet by mouth once daily   FENOFIBRATE 160 MG TABLET    TAKE 1 TABLET BY MOUTH ONCE DAILY    GLIMEPIRIDE (AMARYL) 4 MG TABLET    Take 1 tablet by mouth once daily with breakfast   LOSARTAN (COZAAR) 50 MG TABLET    Take 1 tablet by mouth once daily   MECLIZINE (ANTIVERT) 25 MG TABLET    Take 1 tablet (25 mg total) by mouth 3 (three) times daily as needed for dizziness.   METFORMIN (GLUCOPHAGE-XR) 500 MG 24 HR TABLET    TAKE 4 TABLETS BY MOUTH ONCE DAILY. APPOINTMENT REQUIRED FOR FUTURE REFILLS.  Modified Medications   No medications on file  Discontinued Medications   CONTINUOUS GLUCOSE SENSOR (DEXCOM G7 SENSOR) MISC    1 Device by Does not apply route continuous.   TIRZEPATIDE (MOUNJARO) 12.5 MG/0.5ML PEN    Inject 12.5 mg into the skin once a week.    Allergies No Known Allergies  Past Medical History Past Medical History:  Diagnosis Date   Diabetes mellitus    Hyperlipidemia    Routine general medical examination at  a health care facility 07/01/2023   SBO (small bowel obstruction) (HCC) 03/30/2018    Past Surgical History Past Surgical History:  Procedure Laterality Date   APPENDECTOMY  1972   BACK SURGERY  1973   VASECTOMY  2000    Family History family history includes Diabetes in his brother.  Social History Social History   Socioeconomic History   Marital status: Married    Spouse name: Not on file   Number of children: Not on file   Years of education: Not on file   Highest education level: Not on file  Occupational History   Not on file  Tobacco Use   Smoking status: Never   Smokeless tobacco: Never  Vaping Use   Vaping status: Never Used  Substance and Sexual Activity   Alcohol use: No    Alcohol/week: 0.0 standard drinks of alcohol   Drug use: No   Sexual activity: Not on file  Other Topics Concern   Not on file  Social History Narrative   Not on file   Social Drivers of Health   Financial Resource Strain: Not on file  Food Insecurity: Patient Declined (03/05/2023)   Hunger Vital Sign    Worried About Running Out of Food in the Last  Year: Patient declined    Ran Out of Food in the Last Year: Patient declined  Transportation Needs: Patient Declined (03/05/2023)   PRAPARE - Transportation    Lack of Transportation (Medical): Patient declined    Lack of Transportation (Non-Medical): Patient declined  Physical Activity: Not on file  Stress: Not on file  Social Connections: Unknown (02/06/2023)   Received from [facility]   Social Network    Social Network: Not on file  Intimate Partner Violence: Patient Declined (03/05/2023)   Humiliation, Afraid, Rape, and Kick questionnaire    Fear of Current or Ex-Partner: Patient declined    Emotionally Abused: Patient declined    Physically Abused: Patient declined    Sexually Abused: Patient declined    Lab Results  Component Value Date   HGBA1C 7.3 (H) 07/01/2023   HGBA1C 7.7 (A) 03/31/2023   HGBA1C 8.6 (H) 11/05/2022   Lab Results  Component Value Date   CHOL 113 07/01/2023   Lab Results  Component Value Date   HDL 51.00 07/01/2023   Lab Results  Component Value Date   LDLCALC 50 07/01/2023   Lab Results  Component Value Date   TRIG 61.0 07/01/2023   Lab Results  Component Value Date   CHOLHDL 2 07/01/2023   Lab Results  Component Value Date   CREATININE 1.13 07/01/2023   Lab Results  Component Value Date   GFR 69.85 07/01/2023   Lab Results  Component Value Date   MICROALBUR <0.7 07/01/2023      Component Value Date/Time   NA 137 07/01/2023 0942   K 4.0 07/01/2023 0942   CL 101 07/01/2023 0942   CO2 29 07/01/2023 0942   GLUCOSE 111 (H) 07/01/2023 0942   GLUCOSE 177 (H) 03/08/2006 0807   BUN 19 07/01/2023 0942   CREATININE 1.13 07/01/2023 0942   CREATININE 0.99 10/04/2015 1717   CALCIUM 9.4 07/01/2023 0942   PROT 7.1 07/01/2023 0942   ALBUMIN 4.5 07/01/2023 0942   AST 18 07/01/2023 0942   ALT 19 07/01/2023 0942   ALKPHOS 50 07/01/2023 0942   BILITOT 0.7 07/01/2023 0942   GFRNONAA >60 05/26/2021 0236   GFRAA >60 04/01/2018 0442       Latest Ref Rng &  Units 07/01/2023    9:42 AM 11/05/2022    2:09 PM 05/25/2022    3:16 PM  BMP  Glucose 70 - 99 mg/dL 657  846  63   BUN 6 - 23 mg/dL 19  13  14    Creatinine 0.40 - 1.50 mg/dL 9.62  9.52  8.41   Sodium 135 - 145 mEq/L 137  136  136   Potassium 3.5 - 5.1 mEq/L 4.0  4.2  3.9   Chloride 96 - 112 mEq/L 101  101  101   CO2 19 - 32 mEq/L 29  27  27    Calcium 8.4 - 10.5 mg/dL 9.4  9.4  9.2        Component Value Date/Time   WBC 4.7 07/01/2023 0942   RBC 4.92 07/01/2023 0942   HGB 14.5 07/01/2023 0942   HCT 43.9 07/01/2023 0942   PLT 294.0 07/01/2023 0942   MCV 89.1 07/01/2023 0942   MCH 29.8 05/26/2021 0236   MCHC 33.1 07/01/2023 0942   RDW 14.9 07/01/2023 0942   LYMPHSABS 1.8 07/01/2023 0942   MONOABS 0.5 07/01/2023 0942   EOSABS 0.1 07/01/2023 0942   BASOSABS 0.0 07/01/2023 0942     Parts of this note may have been dictated using voice recognition software. There may be variances in spelling and vocabulary which are unintentional. Not all errors are proofread. Please notify the Thereasa Parkin if any discrepancies are noted or if the meaning of any statement is not clear.

## 2023-07-15 ENCOUNTER — Encounter: Payer: Self-pay | Admitting: "Endocrinology

## 2023-08-19 ENCOUNTER — Telehealth: Payer: Self-pay | Admitting: Dietician

## 2023-08-19 DIAGNOSIS — E1169 Type 2 diabetes mellitus with other specified complication: Secondary | ICD-10-CM

## 2023-08-19 NOTE — Telephone Encounter (Signed)
 Patient called and states that he is out of his Colesevalam (Welchol ) medication and needs a refill.  Dr. Hubert Madden filled this in the past.  Will forward this to CMA.  Cydne Doyne, RD, LDN, CDCES, DipACLM

## 2023-08-20 MED ORDER — COLESEVELAM HCL 625 MG PO TABS: 625.0000 mg | ORAL_TABLET | Freq: Every day | ORAL | 3 refills | Status: AC

## 2023-08-20 NOTE — Telephone Encounter (Signed)
 Requested Prescriptions   Signed Prescriptions Disp Refills   colesevelam  (WELCHOL ) 625 MG tablet 90 tablet 3    Sig: Take 1 tablet (625 mg total) by mouth daily.    Authorizing Provider: Jorge Newcomer    Ordering User: Waneta Gut

## 2023-09-08 ENCOUNTER — Other Ambulatory Visit: Payer: Self-pay | Admitting: "Endocrinology

## 2023-09-08 DIAGNOSIS — E1165 Type 2 diabetes mellitus with hyperglycemia: Secondary | ICD-10-CM

## 2023-09-09 NOTE — Telephone Encounter (Signed)
 Requested Prescriptions   Pending Prescriptions Disp Refills   metFORMIN  (GLUCOPHAGE -XR) 500 MG 24 hr tablet [Pharmacy Med Name: metFORMIN  HCl ER 500 MG Oral Tablet Extended Release 24 Hour] 360 tablet 0    Sig: TAKE 4 TABLETS BY MOUTH ONCE DAILY. APPOINTMENT REQUIRED FOR FUTURE REFILLS.

## 2023-09-12 ENCOUNTER — Other Ambulatory Visit: Payer: Self-pay | Admitting: Nurse Practitioner

## 2023-09-12 DIAGNOSIS — E1165 Type 2 diabetes mellitus with hyperglycemia: Secondary | ICD-10-CM

## 2023-09-14 NOTE — Telephone Encounter (Signed)
 Requesting: Glimepiride  4 MG Oral Tablet  Last Visit: 07/01/2023 Next Visit: 01/03/2024 Last Refill: 04/05/2023  Please Advise

## 2023-09-20 ENCOUNTER — Other Ambulatory Visit: Payer: Self-pay | Admitting: Nurse Practitioner

## 2023-09-21 NOTE — Telephone Encounter (Signed)
 Requesting: Losartan  Potassium 50 MG Oral Tablet  Last Visit: 07/01/2023 Next Visit: 01/03/2024 Last Refill: 06/23/2023  Please Advise

## 2023-09-26 ENCOUNTER — Other Ambulatory Visit: Payer: Self-pay | Admitting: Nurse Practitioner

## 2023-09-28 NOTE — Telephone Encounter (Signed)
 Requesting: Atorvastatin  Calcium  20 MG Oral Tablet  Last Visit: 07/01/2023 Next Visit: 01/03/2024 Last Refill: 06/28/2023  Please Advise

## 2023-10-13 ENCOUNTER — Ambulatory Visit: Admitting: "Endocrinology

## 2023-10-13 ENCOUNTER — Encounter: Payer: Self-pay | Admitting: "Endocrinology

## 2023-10-13 VITALS — BP 134/70 | HR 84 | Ht 74.0 in

## 2023-10-13 DIAGNOSIS — E782 Mixed hyperlipidemia: Secondary | ICD-10-CM

## 2023-10-13 DIAGNOSIS — Z7984 Long term (current) use of oral hypoglycemic drugs: Secondary | ICD-10-CM

## 2023-10-13 DIAGNOSIS — E1140 Type 2 diabetes mellitus with diabetic neuropathy, unspecified: Secondary | ICD-10-CM

## 2023-10-13 DIAGNOSIS — Z7985 Long-term (current) use of injectable non-insulin antidiabetic drugs: Secondary | ICD-10-CM | POA: Diagnosis not present

## 2023-10-13 LAB — POCT GLYCOSYLATED HEMOGLOBIN (HGB A1C): Hemoglobin A1C: 6.5 % — AB (ref 4.0–5.6)

## 2023-10-13 NOTE — Progress Notes (Signed)
 Outpatient Endocrinology Note Douglas Birmingham, MD  10/13/23   Douglas Reeves [birth year] [id number]  Referring Provider: Nedra Tinnie LABOR, NP Primary Care Provider: Nedra Tinnie LABOR, NP Reason for consultation: Subjective   Assessment & Plan  Diagnoses and all orders for this visit:  Type 2 diabetes mellitus with diabetic neuropathy, without long-term current use of insulin  (HCC) -     POCT glycosylated hemoglobin (Hb A1C)  Long term (current) use of oral hypoglycemic drugs  Long-term (current) use of injectable non-insulin  antidiabetic drugs  Mixed hypercholesterolemia and hypertriglyceridemia    Diabetes Type II complicated by neuropathy  Lab Results  Component Value Date   GFR 69.85 07/01/2023   Hba1c goal less than 7, current Hba1c is 7.7 on 04/10/23   Lab Results  Component Value Date   HGBA1C 6.5 (A) 10/13/2023   Will recommend the following: Jardiance  25 mg daily, Mounjaro  15 mg weekly, metformin  ER 2 g daily, skip Amaryl  4 mg daily if BG <70  Ordered DM education previously 07/14/23: Dexcom denied, sent libre  Seen podiatry foot exam on 10/13/2022 by Verta Royden DASEN, DPM  Previous history:  He has been on varied and multiple non-insulin  diabetes medications including Trulicity  Reportedly Trulicity  was stopped because of difficulty with availability Insulin  was not used previously   No known contraindications to any of above medications  -Last LD and Tg are as follows: Lab Results  Component Value Date   LDLCALC 50 07/01/2023    Lab Results  Component Value Date   TRIG 61.0 07/01/2023   -On atorvastatin  20 mg every day, fenofibrate  160 mg every day  -Follow low fat diet and exercise   -Blood pressure goal <140/90 - Microalbumin/creatinine goal < 30 -Last MA/Cr is as follows: Lab Results  Component Value Date   MICROALBUR <0.7 07/01/2023   -on ACE/ARB losartan  50 mg qd -diet changes including salt restriction -limit eating  outside -counseled BP targets per standards of diabetes care -uncontrolled blood pressure can lead to retinopathy, nephropathy and cardiovascular and atherosclerotic heart disease  Reviewed and counseled on: -A1C target -Blood sugar targets -Complications of uncontrolled diabetes  -Checking blood sugar before meals and bedtime and bring log next visit -All medications with mechanism of action and side effects -Hypoglycemia management: rule of 15's, Glucagon Emergency Kit and medical alert ID -low-carb low-fat plate-method diet -At least 20 minutes of physical activity per day -Annual dilated retinal eye exam and foot exam -compliance and follow up needs -follow up as scheduled or earlier if problem gets worse  Call if blood sugar is less than 70 or consistently above 250    Take a 15 gm snack of carbohydrate at bedtime before you go to sleep if your blood sugar is less than 100.    If you are going to fast after midnight for a test or procedure, ask your physician for instructions on how to reduce/decrease your insulin  dose.    Call if blood sugar is less than 70 or consistently above 250  -Treating a low sugar by rule of 15  (15 gms of sugar every 15 min until sugar is more than 70) If you feel your sugar is low, test your sugar to be sure If your sugar is low (less than 70), then take 15 grams of a fast acting Carbohydrate (3-4 glucose tablets or glucose gel or 4 ounces of juice or regular soda) Recheck your sugar 15 min after treating low to make sure it is more than 70  If sugar is still less than 70, treat again with 15 grams of carbohydrate          Don't drive the hour of hypoglycemia  If unconscious/unable to eat or drink by mouth, use glucagon injection or nasal spray baqsimi and call 911. Can repeat again in 15 min if still unconscious.  Return in about 4 months (around 02/13/2024).   I have reviewed current medications, nurse's notes, allergies, vital signs, past medical  and surgical history, family medical history, and social history for this encounter. Counseled patient on symptoms, examination findings, lab findings, imaging results, treatment decisions and monitoring and prognosis. The patient understood the recommendations and agrees with the treatment plan. All questions regarding treatment plan were fully answered.  Douglas Birmingham, MD  10/13/23   History of Present Illness Douglas Reeves is a 63 y.o. year old male who presents for follow up of Type II diabetes mellitus.  Kerman Pfost was first diagnosed in 2003.   Diabetes education +  Home diabetes regimen: Jardiance  25 mg daily, Mounjaro  15 mg weekly, metformin  ER 2 g daily Amaryl  4 mg daily,   Previous history: He has been on varied and multiple non-insulin  diabetes medications including Trulicity  Reportedly Trulicity  was stopped because of difficulty with availability Insulin  was not used previously   COMPLICATIONS -  MI/Stroke -  retinopathy +  neuropathy -  nephropathy  BLOOD SUGAR DATA Per meter, 84-218  Physical Exam  BP 134/70   Pulse 84   Ht 6' 2 (1.88 m)   SpO2 98%   BMI 33.25 kg/m    Constitutional: well developed, well nourished Head: normocephalic, atraumatic Eyes: sclera anicteric, no redness Neck: supple Lungs: normal respiratory effort Neurology: alert and oriented Skin: dry, no appreciable rashes Musculoskeletal: no appreciable defects Psychiatric: normal mood and affect Diabetic Foot Exam - Simple   No data filed      Current Medications Patient's Medications  New Prescriptions   No medications on file  Previous Medications   ATORVASTATIN  (LIPITOR) 20 MG TABLET    TAKE 1 TABLET BY MOUTH AT BEDTIME   COLESEVELAM  (WELCHOL ) 625 MG TABLET    Take 1 tablet (625 mg total) by mouth daily.   CONTINUOUS GLUCOSE SENSOR (FREESTYLE LIBRE 3 SENSOR) MISC    1 Device by Does not apply route continuous.   EMPAGLIFLOZIN  (JARDIANCE ) 25 MG TABS TABLET    Take 1  tablet by mouth once daily   FENOFIBRATE  160 MG TABLET    TAKE 1 TABLET BY MOUTH ONCE DAILY   GLIMEPIRIDE  (AMARYL ) 4 MG TABLET    Take 1 tablet by mouth once daily with breakfast   LOSARTAN  (COZAAR ) 50 MG TABLET    Take 1 tablet by mouth once daily   MECLIZINE  (ANTIVERT ) 25 MG TABLET    Take 1 tablet (25 mg total) by mouth 3 (three) times daily as needed for dizziness.   METFORMIN  (GLUCOPHAGE -XR) 500 MG 24 HR TABLET    TAKE 4 TABLETS BY MOUTH ONCE DAILY. APPOINTMENT REQUIRED FOR FUTURE REFILLS.   TIRZEPATIDE  (MOUNJARO ) 15 MG/0.5ML PEN    Inject 15 mg into the skin once a week.  Modified Medications   No medications on file  Discontinued Medications   No medications on file    Allergies No Known Allergies  Past Medical History Past Medical History:  Diagnosis Date   Diabetes mellitus    Hyperlipidemia    Routine general medical examination at a health care facility 07/01/2023   SBO (small  bowel obstruction) (HCC) 03/30/2018    Past Surgical History Past Surgical History:  Procedure Laterality Date   APPENDECTOMY  1972   BACK SURGERY  1973   VASECTOMY  2000    Family History family history includes Diabetes in his brother.  Social History Social History   Socioeconomic History   Marital status: Married    Spouse name: Not on file   Number of children: Not on file   Years of education: Not on file   Highest education level: Not on file  Occupational History   Not on file  Tobacco Use   Smoking status: Never   Smokeless tobacco: Never  Vaping Use   Vaping status: Never Used  Substance and Sexual Activity   Alcohol use: No    Alcohol/week: 0.0 standard drinks of alcohol   Drug use: No   Sexual activity: Not on file  Other Topics Concern   Not on file  Social History Narrative   Not on file   Social Drivers of Health   Financial Resource Strain: Not on file  Food Insecurity: Patient Declined (03/05/2023)   Hunger Vital Sign    Worried About Running Out of  Food in the Last Year: Patient declined    Ran Out of Food in the Last Year: Patient declined  Transportation Needs: Patient Declined (03/05/2023)   PRAPARE - Transportation    Lack of Transportation (Medical): Patient declined    Lack of Transportation (Non-Medical): Patient declined  Physical Activity: Not on file  Stress: Not on file  Social Connections: Unknown (02/06/2023)   Received from [facility]   Social Network    Social Network: Not on file  Intimate Partner Violence: Patient Declined (03/05/2023)   Humiliation, Afraid, Rape, and Kick questionnaire    Fear of Current or Ex-Partner: Patient declined    Emotionally Abused: Patient declined    Physically Abused: Patient declined    Sexually Abused: Patient declined    Lab Results  Component Value Date   HGBA1C 6.5 (A) 10/13/2023   HGBA1C 7.3 (H) 07/01/2023   HGBA1C 7.7 (A) 03/31/2023   Lab Results  Component Value Date   CHOL 113 07/01/2023   Lab Results  Component Value Date   HDL 51.00 07/01/2023   Lab Results  Component Value Date   LDLCALC 50 07/01/2023   Lab Results  Component Value Date   TRIG 61.0 07/01/2023   Lab Results  Component Value Date   CHOLHDL 2 07/01/2023   Lab Results  Component Value Date   CREATININE 1.13 07/01/2023   Lab Results  Component Value Date   GFR 69.85 07/01/2023   Lab Results  Component Value Date   MICROALBUR <0.7 07/01/2023      Component Value Date/Time   NA 137 07/01/2023 0942   K 4.0 07/01/2023 0942   CL 101 07/01/2023 0942   CO2 29 07/01/2023 0942   GLUCOSE 111 (H) 07/01/2023 0942   GLUCOSE 177 (H) 03/08/2006 0807   BUN 19 07/01/2023 0942   CREATININE 1.13 07/01/2023 0942   CREATININE 0.99 10/04/2015 1717   CALCIUM  9.4 07/01/2023 0942   PROT 7.1 07/01/2023 0942   ALBUMIN 4.5 07/01/2023 0942   AST 18 07/01/2023 0942   ALT 19 07/01/2023 0942   ALKPHOS 50 07/01/2023 0942   BILITOT 0.7 07/01/2023 0942   GFRNONAA >60 05/26/2021 0236   GFRAA >60  04/01/2018 0442      Latest Ref Rng & Units 07/01/2023    9:42 AM 11/05/2022  2:09 PM 05/25/2022    3:16 PM  BMP  Glucose 70 - 99 mg/dL 888  867  63   BUN 6 - 23 mg/dL 19  13  14    Creatinine 0.40 - 1.50 mg/dL 8.86  9.08  8.90   Sodium 135 - 145 mEq/L 137  136  136   Potassium 3.5 - 5.1 mEq/L 4.0  4.2  3.9   Chloride 96 - 112 mEq/L 101  101  101   CO2 19 - 32 mEq/L 29  27  27    Calcium  8.4 - 10.5 mg/dL 9.4  9.4  9.2        Component Value Date/Time   WBC 4.7 07/01/2023 0942   RBC 4.92 07/01/2023 0942   HGB 14.5 07/01/2023 0942   HCT 43.9 07/01/2023 0942   PLT 294.0 07/01/2023 0942   MCV 89.1 07/01/2023 0942   MCH 29.8 05/26/2021 0236   MCHC 33.1 07/01/2023 0942   RDW 14.9 07/01/2023 0942   LYMPHSABS 1.8 07/01/2023 0942   MONOABS 0.5 07/01/2023 0942   EOSABS 0.1 07/01/2023 0942   BASOSABS 0.0 07/01/2023 0942     Parts of this note may have been dictated using voice recognition software. There may be variances in spelling and vocabulary which are unintentional. Not all errors are proofread. Please notify the dino if any discrepancies are noted or if the meaning of any statement is not clear.

## 2023-12-09 ENCOUNTER — Telehealth: Payer: Self-pay | Admitting: Dietician

## 2023-12-10 NOTE — Telephone Encounter (Signed)
 Pt informed of Dr Dartha recommendation against fenofibrate  given pt  triglycerides have been very good for the past few times. We can reevaluate the lipids in 3 to 6 months after stopping the pill and see if he even needs it.

## 2023-12-11 ENCOUNTER — Other Ambulatory Visit: Payer: Self-pay | Admitting: Nurse Practitioner

## 2023-12-11 DIAGNOSIS — E1165 Type 2 diabetes mellitus with hyperglycemia: Secondary | ICD-10-CM

## 2023-12-14 NOTE — Telephone Encounter (Signed)
 Requesting: Glimepiride  4 MG Oral Tablet  Last Visit: 07/01/2023 Next Visit: 01/03/2024 Last Refill: 09/14/2023  Please Advise

## 2023-12-18 ENCOUNTER — Other Ambulatory Visit: Payer: Self-pay | Admitting: Nurse Practitioner

## 2024-01-03 ENCOUNTER — Ambulatory Visit: Admitting: Nurse Practitioner

## 2024-01-10 ENCOUNTER — Encounter: Payer: Self-pay | Admitting: Nurse Practitioner

## 2024-01-10 ENCOUNTER — Ambulatory Visit: Admitting: Nurse Practitioner

## 2024-01-10 VITALS — BP 124/62 | HR 75 | Temp 97.7°F | Ht 74.0 in | Wt 252.6 lb

## 2024-01-10 DIAGNOSIS — E1169 Type 2 diabetes mellitus with other specified complication: Secondary | ICD-10-CM | POA: Diagnosis not present

## 2024-01-10 DIAGNOSIS — I152 Hypertension secondary to endocrine disorders: Secondary | ICD-10-CM

## 2024-01-10 DIAGNOSIS — Z23 Encounter for immunization: Secondary | ICD-10-CM | POA: Diagnosis not present

## 2024-01-10 DIAGNOSIS — Z7984 Long term (current) use of oral hypoglycemic drugs: Secondary | ICD-10-CM | POA: Diagnosis not present

## 2024-01-10 DIAGNOSIS — E1159 Type 2 diabetes mellitus with other circulatory complications: Secondary | ICD-10-CM | POA: Diagnosis not present

## 2024-01-10 DIAGNOSIS — Z7985 Long-term (current) use of injectable non-insulin antidiabetic drugs: Secondary | ICD-10-CM | POA: Diagnosis not present

## 2024-01-10 DIAGNOSIS — E669 Obesity, unspecified: Secondary | ICD-10-CM

## 2024-01-10 MED ORDER — COVID-19 MRNA VAC-TRIS(PFIZER) 30 MCG/0.3ML IM SUSY: 0.3000 mL | PREFILLED_SYRINGE | Freq: Once | INTRAMUSCULAR | 0 refills | Status: AC

## 2024-01-10 NOTE — Assessment & Plan Note (Addendum)
 Continue Mounjaro  15 mg injection weekly

## 2024-01-10 NOTE — Assessment & Plan Note (Signed)
Continue glimepiride 4 mg daily, Jardiance 25 mg daily, and metformin XR 2000 mg daily

## 2024-01-10 NOTE — Assessment & Plan Note (Signed)
 Chronic, stable. Diabetes is well-controlled with an A1c of 6.5% and no complications reported. Continue jardiance  25mg  daily, amaryl  4mg  daily, metformin  XR 2,000mg  daily, and mounjaro  15mg  weekly. He follows regularly with endocrinology. Encourage regular physical activity for weight management. Follow-up in 6 months.

## 2024-01-10 NOTE — Patient Instructions (Signed)
It was great to see you!  Keep up the great work!   Let's follow-up in 6 months, sooner if you have concerns.  If a referral was placed today, you will be contacted for an appointment. Please note that routine referrals can sometimes take up to 3-4 weeks to process. Please call our office if you haven't heard anything after this time frame.  Take care,  Vendela Troung, NP  

## 2024-01-10 NOTE — Progress Notes (Signed)
 Established Patient Office Visit  Subjective   Patient ID: Noeh Sparacino, male    DOB: May 09, 1960  Age: 63 y.o. MRN: [id number]  Chief Complaint  Patient presents with   Diabetes    Follow, no concerns, Flu Vaccine, Covid Rx   HPI:  Discussed the use of AI scribe software for clinical note transcription with the patient, who gave verbal consent to proceed.  History of Present Illness   Tannon Peerson is a 63 year old male with diabetes who presents for routine follow-up and flu vaccination.  His diabetes is well-managed with a recent A1c of 6.5%. He engages in regular walking and has experienced some weight loss. Home blood sugar monitoring shows good control, with a reading of 112 mg/dL this morning. He has no chest pain, shortness of breath, numbness, or tingling in his feet. He plans to schedule an appointment with his foot doctor, whom he sees annually.       ROS See pertinent positives and negatives per HPI.    Objective:     BP 124/62 (BP Location: Left Arm, Patient Position: Sitting, Cuff Size: Large)   Pulse 75   Temp 97.7 F (36.5 C)   Ht 6' 2 (1.88 m)   Wt 252 lb 9.6 oz (114.6 kg)   SpO2 98%   BMI 32.43 kg/m  BP Readings from Last 3 Encounters:  01/10/24 124/62  10/13/23 134/70  07/14/23 124/80   Wt Readings from Last 3 Encounters:  01/10/24 252 lb 9.6 oz (114.6 kg)  07/14/23 259 lb (117.5 kg)  07/01/23 260 lb 3.2 oz (118 kg)      Physical Exam Vitals and nursing note reviewed.  Constitutional:      Appearance: Normal appearance.  HENT:     Head: Normocephalic.  Eyes:     Conjunctiva/sclera: Conjunctivae normal.  Cardiovascular:     Rate and Rhythm: Normal rate and regular rhythm.     Pulses: Normal pulses.     Heart sounds: Normal heart sounds.  Pulmonary:     Effort: Pulmonary effort is normal.     Breath sounds: Normal breath sounds.  Musculoskeletal:     Cervical back: Normal range of motion.  Skin:    General: Skin is warm.   Neurological:     General: No focal deficit present.     Mental Status: He is alert and oriented to person, place, and time.  Psychiatric:        Mood and Affect: Mood normal.        Behavior: Behavior normal.        Thought Content: Thought content normal.        Judgment: Judgment normal.      Assessment & Plan:   Problem List Items Addressed This Visit       Cardiovascular and Mediastinum   Hypertension associated with diabetes (HCC) - Primary   Chronic, stable. Continue losartan  50 mg daily.  Follow-up in 6 months.        Endocrine   Type 2 diabetes mellitus with obesity   Chronic, stable. Diabetes is well-controlled with an A1c of 6.5% and no complications reported. Continue jardiance  25mg  daily, amaryl  4mg  daily, metformin  XR 2,000mg  daily, and mounjaro  15mg  weekly. He follows regularly with endocrinology. Encourage regular physical activity for weight management. Follow-up in 6 months.       Relevant Medications   COVID-19 mRNA vaccine, Pfizer, (COMIRNATY) syringe     Other   Long-term current use of injectable  noninsulin antidiabetic medication   Continue Mounjaro  15 mg injection weekly      Long term current use of oral hypoglycemic drug   Continue glimepiride  4 mg daily, Jardiance  25 mg daily, and metformin  XR 2000 mg daily      Other Visit Diagnoses       Immunization due       Flu vaccine given today and covid-19 RX given.   Relevant Medications   COVID-19 mRNA vaccine, Pfizer, (COMIRNATY) syringe   Other Relevant Orders   Flu vaccine trivalent PF, 6mos and older(Flulaval,Afluria,Fluarix,Fluzone)       Return in about 6 months (around 07/09/2024) for CPE.    Tinnie DELENA Harada, NP

## 2024-01-10 NOTE — Assessment & Plan Note (Addendum)
Chronic, stable.  Continue losartan 50 mg daily.  Follow-up in 6 months.

## 2024-02-15 ENCOUNTER — Ambulatory Visit: Admitting: "Endocrinology

## 2024-03-01 ENCOUNTER — Encounter: Payer: Self-pay | Admitting: "Endocrinology

## 2024-03-01 ENCOUNTER — Ambulatory Visit: Admitting: "Endocrinology

## 2024-03-01 ENCOUNTER — Ambulatory Visit: Payer: Self-pay

## 2024-03-01 VITALS — BP 106/74 | HR 86 | Ht 74.0 in | Wt 250.0 lb

## 2024-03-01 DIAGNOSIS — E1140 Type 2 diabetes mellitus with diabetic neuropathy, unspecified: Secondary | ICD-10-CM

## 2024-03-01 DIAGNOSIS — Z7985 Long-term (current) use of injectable non-insulin antidiabetic drugs: Secondary | ICD-10-CM

## 2024-03-01 DIAGNOSIS — E782 Mixed hyperlipidemia: Secondary | ICD-10-CM

## 2024-03-01 DIAGNOSIS — Z7984 Long term (current) use of oral hypoglycemic drugs: Secondary | ICD-10-CM

## 2024-03-01 LAB — POCT GLYCOSYLATED HEMOGLOBIN (HGB A1C): Hemoglobin A1C: 6.6 % — AB (ref 4.0–5.6)

## 2024-03-01 MED ORDER — FREESTYLE LIBRE 3 PLUS SENSOR MISC: 3 refills | Status: AC

## 2024-03-01 NOTE — Telephone Encounter (Signed)
 FYI Only or Action Required?: Action required by provider: request for appointment and clinical question for provider.  Patient was last seen in primary care on 01/10/2024 by Nedra Tinnie LABOR, NP.  Called Nurse Triage reporting Leg Swelling.  Symptoms began about a month ago.  Interventions attempted: Rest, hydration, or home remedies.  Symptoms are: unchanged.  Triage Disposition: See Physician Within 24 Hours  Patient/caregiver understands and will follow disposition?: No, wishes to speak with PCP  Copied from CRM #[id number]. Topic: Clinical - Red Word Triage >> Mar 01, 2024  3:37 PM Aisha D wrote: Red Word that prompted transfer to Nurse Triage: swelling  Pt states that he has a bruise on his right leg, near the ankle and is also experiencing swelling. Pt would like to get an appt scheduled with his provider.    ----------------------------------------------------------------------- From previous Reason for Contact - Scheduling: Patient/patient representative is calling to schedule an appointment. Refer to attachments for appointment information. Reason for Disposition  [1] MODERATE leg swelling (e.g., swelling extends up to knees) AND [2] new-onset or getting worse  Answer Assessment - Initial Assessment Questions 1. ONSET: When did the swelling start? (e.g., minutes, hours, days)     Swelling has been going on over a month.  2. LOCATION: What part of the leg is swollen?  Are both legs swollen or just one leg?     Right leg-from the calf down 3. SEVERITY: How bad is the swelling? (e.g., localized; mild, moderate, severe)     Mild 4. REDNESS: Is there redness or signs of infection?     Possible signs of infection per patient's DM doctor.  5. PAIN: Is the swelling painful to touch? If Yes, ask: How painful is it?   (Scale 1-10; mild, moderate or severe)     no 6. FEVER: Do you have a fever? If Yes, ask: What is it, how was it measured, and when did it start?       no 7. CAUSE: What do you think is causing the leg swelling?     unsure 8. MEDICAL HISTORY: Do you have a history of blood clots (e.g., DVT), cancer, heart failure, kidney disease, or liver failure?     No  9. RECURRENT SYMPTOM: Have you had leg swelling before? If Yes, ask: When was the last time? What happened that time?     yes 10. OTHER SYMPTOMS: Do you have any other symptoms? (e.g., chest pain, difficulty breathing)       No  Protocols used: Leg Swelling and Edema-A-AH

## 2024-03-01 NOTE — Telephone Encounter (Signed)
 I called and spoke with patient and he could come in at 420pm.

## 2024-03-01 NOTE — Progress Notes (Signed)
 Outpatient Endocrinology Note Douglas Birmingham, MD  03/01/24   Douglas Reeves 62-10-1960 [id number]  Referring Provider: Nedra Tinnie LABOR, NP Primary Care Provider: Nedra Tinnie LABOR, NP Reason for consultation: Subjective   Assessment & Plan  Diagnoses and all orders for this visit:  Type 2 diabetes mellitus with diabetic neuropathy, without long-term current use of insulin  (HCC) -     POCT glycosylated hemoglobin (Hb A1C)  Long term (current) use of oral hypoglycemic drugs  Long-term (current) use of injectable non-insulin  antidiabetic drugs  Mixed hypercholesterolemia and hypertriglyceridemia    Diabetes Type II complicated by neuropathy  Lab Results  Component Value Date   GFR 69.85 07/01/2023   Hba1c goal less than 7, current Hba1c is 7.7 on 04/10/23   Lab Results  Component Value Date   HGBA1C 6.6 (A) 03/01/2024   Will recommend the following: Jardiance  25 mg daily, Mounjaro  15 mg weekly, metformin  ER 2 g daily, skip Amaryl  4 mg daily if BG <70  Ordered DM education previously 07/14/23: Dexcom denied, sent libre  Seen podiatry foot exam on 10/13/2022 by Verta Royden DASEN, DPM  Previous history:  He has been on varied and multiple non-insulin  diabetes medications including Trulicity  Reportedly Trulicity  was stopped because of difficulty with availability Insulin  was not used previously   No known contraindications to any of above medications  -Last LD and Tg are as follows: Lab Results  Component Value Date   LDLCALC 50 07/01/2023    Lab Results  Component Value Date   TRIG 61.0 07/01/2023   -On atorvastatin  20 mg every day, fenofibrate  160 mg every day  -Follow low fat diet and exercise   -Blood pressure goal <140/90 - Microalbumin/creatinine goal < 30 -Last MA/Cr is as follows: Lab Results  Component Value Date   MICROALBUR <0.7 07/01/2023   -on ACE/ARB losartan  50 mg qd -diet changes including salt restriction -limit eating  outside -counseled BP targets per standards of diabetes care -uncontrolled blood pressure can lead to retinopathy, nephropathy and cardiovascular and atherosclerotic heart disease  Reviewed and counseled on: -A1C target -Blood sugar targets -Complications of uncontrolled diabetes  -Checking blood sugar before meals and bedtime and bring log next visit -All medications with mechanism of action and side effects -Hypoglycemia management: rule of 15's, Glucagon Emergency Kit and medical alert ID -low-carb low-fat plate-method diet -At least 20 minutes of physical activity per day -Annual dilated retinal eye exam and foot exam -compliance and follow up needs -follow up as scheduled or earlier if problem gets worse  Call if blood sugar is less than 70 or consistently above 250    Take a 15 gm snack of carbohydrate at bedtime before you go to sleep if your blood sugar is less than 100.    If you are going to fast after midnight for a test or procedure, ask your physician for instructions on how to reduce/decrease your insulin  dose.    Call if blood sugar is less than 70 or consistently above 250  -Treating a low sugar by rule of 15  (15 gms of sugar every 15 min until sugar is more than 70) If you feel your sugar is low, test your sugar to be sure If your sugar is low (less than 70), then take 15 grams of a fast acting Carbohydrate (3-4 glucose tablets or glucose gel or 4 ounces of juice or regular soda) Recheck your sugar 15 min after treating low to make sure it is more than 70  If sugar is still less than 70, treat again with 15 grams of carbohydrate          Don't drive the hour of hypoglycemia  If unconscious/unable to eat or drink by mouth, use glucagon injection or nasal spray baqsimi and call 911. Can repeat again in 15 min if still unconscious.  No follow-ups on file.   I have reviewed current medications, nurse's notes, allergies, vital signs, past medical and surgical history,  family medical history, and social history for this encounter. Counseled patient on symptoms, examination findings, lab findings, imaging results, treatment decisions and monitoring and prognosis. The patient understood the recommendations and agrees with the treatment plan. All questions regarding treatment plan were fully answered.  Douglas Birmingham, MD  03/01/24   History of Present Illness Douglas Reeves is a 63 y.o. year old male who presents for follow up of Type II diabetes mellitus.  Douglas Reeves was first diagnosed in 2003.   Diabetes education +  Home diabetes regimen: Jardiance  25 mg daily, Mounjaro  15 mg weekly, metformin  ER 2 g daily Amaryl  4 mg daily,   Previous history: He has been on varied and multiple non-insulin  diabetes medications including Trulicity  Reportedly Trulicity  was stopped because of difficulty with availability Insulin  was not used previously   COMPLICATIONS -  MI/Stroke -  retinopathy +  neuropathy -  nephropathy  BLOOD SUGAR DATA Per meter, [phone]  Physical Exam  BP 106/74   Pulse 86   Ht 6' 2 (1.88 m)   Wt 250 lb (113.4 kg)   SpO2 96%   BMI 32.10 kg/m    Constitutional: well developed, well nourished Head: normocephalic, atraumatic Eyes: sclera anicteric, no redness Neck: supple Lungs: normal respiratory effort Neurology: alert and oriented Skin: dry, no appreciable rashes Musculoskeletal: no appreciable defects Psychiatric: normal mood and affect Diabetic Foot Exam - Simple   Simple Foot Form Diabetic Foot exam was performed with the following findings: Yes 03/01/2024  3:17 PM  Visual Inspection No deformities, no ulcerations, no other skin breakdown bilaterally: Yes Sensation Testing Intact to touch and monofilament testing bilaterally: Yes Pulse Check Comments poster      Current Medications Patient's Medications  New Prescriptions   No medications on file  Previous Medications   ATORVASTATIN  (LIPITOR) 20 MG  TABLET    TAKE 1 TABLET BY MOUTH AT BEDTIME   COLESEVELAM  (WELCHOL ) 625 MG TABLET    Take 1 tablet (625 mg total) by mouth daily.   CONTINUOUS GLUCOSE SENSOR (FREESTYLE LIBRE 3 SENSOR) MISC    1 Device by Does not apply route continuous.   EMPAGLIFLOZIN  (JARDIANCE ) 25 MG TABS TABLET    Take 1 tablet by mouth once daily   FENOFIBRATE  160 MG TABLET    TAKE 1 TABLET BY MOUTH ONCE DAILY   GLIMEPIRIDE  (AMARYL ) 4 MG TABLET    Take 1 tablet by mouth once daily with breakfast   LOSARTAN  (COZAAR ) 50 MG TABLET    Take 1 tablet by mouth once daily   MECLIZINE  (ANTIVERT ) 25 MG TABLET    Take 1 tablet (25 mg total) by mouth 3 (three) times daily as needed for dizziness.   METFORMIN  (GLUCOPHAGE -XR) 500 MG 24 HR TABLET    TAKE 4 TABLETS BY MOUTH ONCE DAILY. APPOINTMENT REQUIRED FOR FUTURE REFILLS.   TIRZEPATIDE  (MOUNJARO ) 15 MG/0.5ML PEN    Inject 15 mg into the skin once a week.  Modified Medications   No medications on file  Discontinued Medications   No  medications on file    Allergies No Known Allergies  Past Medical History Past Medical History:  Diagnosis Date   Diabetes mellitus    Hyperlipidemia    Routine general medical examination at a health care facility 07/01/2023   SBO (small bowel obstruction) (HCC) 03/30/2018    Past Surgical History Past Surgical History:  Procedure Laterality Date   APPENDECTOMY  1972   BACK SURGERY  1973   VASECTOMY  2000    Family History family history includes Diabetes in his brother.  Social History Social History   Socioeconomic History   Marital status: Married    Spouse name: Not on file   Number of children: Not on file   Years of education: Not on file   Highest education level: Not on file  Occupational History   Not on file  Tobacco Use   Smoking status: Never   Smokeless tobacco: Never  Vaping Use   Vaping status: Never Used  Substance and Sexual Activity   Alcohol use: No    Alcohol/week: 0.0 standard drinks of alcohol   Drug  use: No   Sexual activity: Not on file  Other Topics Concern   Not on file  Social History Narrative   Not on file   Social Drivers of Health   Financial Resource Strain: Not on file  Food Insecurity: Patient Declined (03/05/2023)   Hunger Vital Sign    Worried About Running Out of Food in the Last Year: Patient declined    Ran Out of Food in the Last Year: Patient declined  Transportation Needs: Patient Declined (03/05/2023)   PRAPARE - Administrator, Civil Service (Medical): Patient declined    Lack of Transportation (Non-Medical): Patient declined  Physical Activity: Not on file  Stress: Not on file  Social Connections: Unknown (02/06/2023)   Received from [employer]   Social Network    Social Network: Not on file  Intimate Partner Violence: Patient Declined (03/05/2023)   Humiliation, Afraid, Rape, and Kick questionnaire    Fear of Current or Ex-Partner: Patient declined    Emotionally Abused: Patient declined    Physically Abused: Patient declined    Sexually Abused: Patient declined    Lab Results  Component Value Date   HGBA1C 6.6 (A) 03/01/2024   HGBA1C 6.5 (A) 10/13/2023   HGBA1C 7.3 (H) 07/01/2023   Lab Results  Component Value Date   CHOL 113 07/01/2023   Lab Results  Component Value Date   HDL 51.00 07/01/2023   Lab Results  Component Value Date   LDLCALC 50 07/01/2023   Lab Results  Component Value Date   TRIG 61.0 07/01/2023   Lab Results  Component Value Date   CHOLHDL 2 07/01/2023   Lab Results  Component Value Date   CREATININE 1.13 07/01/2023   Lab Results  Component Value Date   GFR 69.85 07/01/2023   Lab Results  Component Value Date   MICROALBUR <0.7 07/01/2023      Component Value Date/Time   NA 137 07/01/2023 0942   K 4.0 07/01/2023 0942   CL 101 07/01/2023 0942   CO2 29 07/01/2023 0942   GLUCOSE 111 (H) 07/01/2023 0942   GLUCOSE 177 (H) 03/08/2006 0807   BUN 19 07/01/2023 0942   CREATININE 1.13  07/01/2023 0942   CREATININE 0.99 10/04/2015 1717   CALCIUM  9.4 07/01/2023 0942   PROT 7.1 07/01/2023 0942   ALBUMIN 4.5 07/01/2023 0942   AST 18 07/01/2023 0942  ALT 19 07/01/2023 0942   ALKPHOS 50 07/01/2023 0942   BILITOT 0.7 07/01/2023 0942   GFRNONAA >60 05/26/2021 0236   GFRAA >60 04/01/2018 0442      Latest Ref Rng & Units 07/01/2023    9:42 AM 11/05/2022    2:09 PM 05/25/2022    3:16 PM  BMP  Glucose 70 - 99 mg/dL 888  867  63   BUN 6 - 23 mg/dL 19  13  14    Creatinine 0.40 - 1.50 mg/dL 8.86  9.08  8.90   Sodium 135 - 145 mEq/L 137  136  136   Potassium 3.5 - 5.1 mEq/L 4.0  4.2  3.9   Chloride 96 - 112 mEq/L 101  101  101   CO2 19 - 32 mEq/L 29  27  27    Calcium  8.4 - 10.5 mg/dL 9.4  9.4  9.2        Component Value Date/Time   WBC 4.7 07/01/2023 0942   RBC 4.92 07/01/2023 0942   HGB 14.5 07/01/2023 0942   HCT 43.9 07/01/2023 0942   PLT 294.0 07/01/2023 0942   MCV 89.1 07/01/2023 0942   MCH 29.8 05/26/2021 0236   MCHC 33.1 07/01/2023 0942   RDW 14.9 07/01/2023 0942   LYMPHSABS 1.8 07/01/2023 0942   MONOABS 0.5 07/01/2023 0942   EOSABS 0.1 07/01/2023 0942   BASOSABS 0.0 07/01/2023 0942     Parts of this note may have been dictated using voice recognition software. There may be variances in spelling and vocabulary which are unintentional. Not all errors are proofread. Please notify the dino if any discrepancies are noted or if the meaning of any statement is not clear.

## 2024-03-02 ENCOUNTER — Encounter: Payer: Self-pay | Admitting: Nurse Practitioner

## 2024-03-02 ENCOUNTER — Ambulatory Visit: Admitting: Nurse Practitioner

## 2024-03-02 ENCOUNTER — Ambulatory Visit: Admitting: Family Medicine

## 2024-03-02 VITALS — BP 122/72 | HR 83 | Temp 97.3°F | Ht 74.0 in | Wt 251.4 lb

## 2024-03-02 DIAGNOSIS — M7989 Other specified soft tissue disorders: Secondary | ICD-10-CM

## 2024-03-02 MED ORDER — CEPHALEXIN 500 MG PO CAPS: 500.0000 mg | ORAL_CAPSULE | Freq: Three times a day (TID) | ORAL | 0 refills | Status: AC

## 2024-03-02 NOTE — Patient Instructions (Addendum)
 It was great to see you!  Start keflex 3 times a day - antibiotic  Prop your leg up when sitting   I am ordering an ultrasound to make sure there is no blood clot - they will call to schedule   Let's follow-up if symptoms worsen or don't improve  Take care,  Tinnie Harada, NP

## 2024-03-02 NOTE — Progress Notes (Signed)
 Acute Office Visit  Subjective:     Patient ID: Douglas Reeves, male    DOB: 03-23-1961, 63 y.o.   MRN: [id number]  Chief Complaint  Patient presents with   Leg Swelling    Right lower leg swelling for 2 months-no pain    HPI Discussed the use of AI scribe software for clinical note transcription with the patient, who gave verbal consent to proceed.  History of Present Illness   Douglas Reeves is a 63 year old male who presents with swelling in the right leg.  Swelling in the right leg has been present for a few months without pain or significant redness. A small area of skin change is noted, which has not improved with over-the-counter cream. There is no history of specific injury to the leg.  He denies shortness of breath, chest pain, or swelling in the left leg. Blood sugars are well-controlled. There have been no recent long car rides or flights, though he has traveled to [place] for work and plans an upcoming cruise.     ROS See pertinent positives and negatives per HPI.    Objective:    BP 122/72 (BP Location: Left Arm, Patient Position: Sitting, Cuff Size: Normal)   Pulse 83   Temp (!) 97.3 F (36.3 C)   Ht 6' 2 (1.88 m)   Wt 251 lb 6.4 oz (114 kg)   SpO2 98%   BMI 32.28 kg/m    Physical Exam Vitals and nursing note reviewed.  Constitutional:      Appearance: Normal appearance.  HENT:     Head: Normocephalic.  Eyes:     Conjunctiva/sclera: Conjunctivae normal.  Cardiovascular:     Rate and Rhythm: Normal rate and regular rhythm.     Pulses: Normal pulses.     Heart sounds: Normal heart sounds.  Pulmonary:     Effort: Pulmonary effort is normal.     Breath sounds: Normal breath sounds.  Musculoskeletal:     Cervical back: Normal range of motion.     Right lower leg: Edema (non-pitting) present.  Skin:    General: Skin is warm.     Comments: Cracked skin on right lateral shin, some purulent drainage  Neurological:     General: No focal deficit  present.     Mental Status: He is alert and oriented to person, place, and time.  Psychiatric:        Mood and Affect: Mood normal.        Behavior: Behavior normal.        Thought Content: Thought content normal.        Judgment: Judgment normal.       Assessment & Plan:   Problem List Items Addressed This Visit       Other   Leg swelling - Primary   Chronic swelling with skin changes, without pain or redness. Differential diagnosis includes infection or blood clot, with recent travel noted. Ordered right leg ultrasound to rule out blood clot. Prescribed Keflex  500 mg orally TID for 7 days for possible infection. Advised leg elevation when sitting. Instructed to keep area covered and apply cream.       Relevant Orders   VAS US  LOWER EXTREMITY VENOUS (DVT)    Meds ordered this encounter  Medications   cephALEXin  (KEFLEX ) 500 MG capsule    Sig: Take 1 capsule (500 mg total) by mouth 3 (three) times daily.    Dispense:  21 capsule    Refill:  0    Return if symptoms worsen or fail to improve.  Tinnie DELENA Harada, NP

## 2024-03-03 ENCOUNTER — Ambulatory Visit (HOSPITAL_COMMUNITY)
Admission: RE | Admit: 2024-03-03 | Discharge: 2024-03-03 | Disposition: A | Discharge status: Home / Self Care | Source: Ambulatory Visit | Attending: Nurse Practitioner | Admitting: Nurse Practitioner

## 2024-03-03 ENCOUNTER — Ambulatory Visit: Payer: Self-pay | Admitting: Nurse Practitioner

## 2024-03-03 DIAGNOSIS — M7989 Other specified soft tissue disorders: Secondary | ICD-10-CM | POA: Insufficient documentation

## 2024-03-03 NOTE — Assessment & Plan Note (Signed)
 Chronic swelling with skin changes, without pain or redness. Differential diagnosis includes infection or blood clot, with recent travel noted. Ordered right leg ultrasound to rule out blood clot. Prescribed Keflex  500 mg orally TID for 7 days for possible infection. Advised leg elevation when sitting. Instructed to keep area covered and apply cream.

## 2024-03-11 ENCOUNTER — Other Ambulatory Visit: Payer: Self-pay | Admitting: Nurse Practitioner

## 2024-03-11 DIAGNOSIS — E1165 Type 2 diabetes mellitus with hyperglycemia: Secondary | ICD-10-CM

## 2024-03-13 NOTE — Telephone Encounter (Signed)
 Requesting: Glimepiride  4 MG Oral Tablet  Last Visit: 03/02/2024 Next Visit: 07/11/2024 Last Refill: 12/14/2023  Please Advise

## 2024-03-17 ENCOUNTER — Other Ambulatory Visit: Payer: Self-pay | Admitting: Nurse Practitioner

## 2024-03-17 NOTE — Telephone Encounter (Signed)
 Requesting: Losartan  Potassium 50 MG Oral Tablet  Last Visit: 03/02/2024 Next Visit: 07/11/2024 Last Refill: 12/20/2023  Please Advise

## 2024-03-19 ENCOUNTER — Other Ambulatory Visit: Payer: Self-pay | Admitting: "Endocrinology

## 2024-03-19 DIAGNOSIS — E1165 Type 2 diabetes mellitus with hyperglycemia: Secondary | ICD-10-CM

## 2024-03-26 ENCOUNTER — Other Ambulatory Visit: Payer: Self-pay | Admitting: Nurse Practitioner

## 2024-03-27 NOTE — Telephone Encounter (Signed)
 Requesting: Atorvastatin  Calcium  20 MG Oral Tablet  Last Visit: 03/17/2024 Next Visit: 07/11/2024 Last Refill: 09/28/2023  Please Advise

## 2024-04-11 ENCOUNTER — Ambulatory Visit: Admitting: Podiatry

## 2024-05-10 ENCOUNTER — Other Ambulatory Visit: Payer: Self-pay | Admitting: Endocrinology

## 2024-05-10 DIAGNOSIS — E1165 Type 2 diabetes mellitus with hyperglycemia: Secondary | ICD-10-CM

## 2024-05-11 ENCOUNTER — Telehealth: Payer: Self-pay | Admitting: Dietician

## 2024-05-11 NOTE — Telephone Encounter (Signed)
 Patient called re:  Jardiance  refill. Chart reviewed.  This was refilled today.  Patient informed.  Leita Constable, RD, LDN, CDCES, DipACLM

## 2024-05-14 ENCOUNTER — Other Ambulatory Visit: Payer: Self-pay | Admitting: "Endocrinology

## 2024-07-11 ENCOUNTER — Encounter: Admitting: Nurse Practitioner

## 2024-08-30 ENCOUNTER — Ambulatory Visit: Admitting: "Endocrinology
